# Patient Record
Sex: Female | Born: 1940
Health system: Southern US, Community
[De-identification: ages and names within clinical notes are randomized; demographics above are authoritative.]

## PROBLEM LIST (undated history)

## (undated) DIAGNOSIS — K635 Polyp of colon: Secondary | ICD-10-CM

## (undated) DIAGNOSIS — H409 Unspecified glaucoma: Secondary | ICD-10-CM

## (undated) DIAGNOSIS — H269 Unspecified cataract: Secondary | ICD-10-CM

## (undated) DIAGNOSIS — M8580 Other specified disorders of bone density and structure, unspecified site: Secondary | ICD-10-CM

## (undated) DIAGNOSIS — E785 Hyperlipidemia, unspecified: Secondary | ICD-10-CM

## (undated) DIAGNOSIS — D509 Iron deficiency anemia, unspecified: Secondary | ICD-10-CM

## (undated) DIAGNOSIS — N6019 Diffuse cystic mastopathy of unspecified breast: Secondary | ICD-10-CM

## (undated) DIAGNOSIS — I10 Essential (primary) hypertension: Secondary | ICD-10-CM

## (undated) DIAGNOSIS — J302 Other seasonal allergic rhinitis: Secondary | ICD-10-CM

## (undated) DIAGNOSIS — R079 Chest pain, unspecified: Secondary | ICD-10-CM

## (undated) DIAGNOSIS — M545 Low back pain, unspecified: Secondary | ICD-10-CM

## (undated) DIAGNOSIS — K219 Gastro-esophageal reflux disease without esophagitis: Secondary | ICD-10-CM

## (undated) DIAGNOSIS — E119 Type 2 diabetes mellitus without complications: Secondary | ICD-10-CM

## (undated) DIAGNOSIS — M1990 Unspecified osteoarthritis, unspecified site: Secondary | ICD-10-CM

## (undated) HISTORY — DX: Unspecified osteoarthritis, unspecified site: M19.90

## (undated) HISTORY — DX: Unspecified glaucoma: H40.9

## (undated) HISTORY — DX: Unspecified cataract: H26.9

## (undated) HISTORY — DX: Other seasonal allergic rhinitis: J30.2

## (undated) HISTORY — PX: BREAST LUMPECTOMY: SHX2

## (undated) HISTORY — DX: Low back pain, unspecified: M54.50

## (undated) HISTORY — DX: Other specified disorders of bone density and structure, unspecified site: M85.80

## (undated) HISTORY — DX: Type 2 diabetes mellitus without complications: E11.9

## (undated) HISTORY — DX: Iron deficiency anemia, unspecified: D50.9

## (undated) HISTORY — DX: Gastro-esophageal reflux disease without esophagitis: K21.9

## (undated) HISTORY — DX: Low back pain: M54.5

## (undated) HISTORY — DX: Diffuse cystic mastopathy of unspecified breast: N60.19

## (undated) HISTORY — PX: BREAST EXCISIONAL BIOPSY: SUR124

## (undated) HISTORY — DX: Essential (primary) hypertension: I10

## (undated) HISTORY — PX: CHOLECYSTECTOMY: SHX55

## (undated) HISTORY — DX: Chest pain, unspecified: R07.9

## (undated) HISTORY — DX: Hyperlipidemia, unspecified: E78.5

## (undated) HISTORY — PX: ABDOMINAL HYSTERECTOMY: SHX81

## (undated) HISTORY — DX: Polyp of colon: K63.5

---

## 1997-10-12 ENCOUNTER — Ambulatory Visit (HOSPITAL_COMMUNITY): Admission: RE | Admit: 1997-10-12 | Discharge: 1997-10-12 | Payer: Self-pay | Admitting: Family Medicine

## 1999-02-25 ENCOUNTER — Encounter: Payer: Self-pay | Admitting: Family Medicine

## 1999-02-25 ENCOUNTER — Ambulatory Visit (HOSPITAL_COMMUNITY): Admission: RE | Admit: 1999-02-25 | Discharge: 1999-02-25 | Payer: Self-pay | Admitting: Family Medicine

## 1999-03-21 ENCOUNTER — Other Ambulatory Visit: Admission: RE | Admit: 1999-03-21 | Discharge: 1999-03-21 | Payer: Self-pay | Admitting: Family Medicine

## 1999-04-23 ENCOUNTER — Encounter: Admission: RE | Admit: 1999-04-23 | Discharge: 1999-04-23 | Payer: Self-pay | Admitting: Family Medicine

## 1999-04-23 ENCOUNTER — Encounter: Payer: Self-pay | Admitting: Family Medicine

## 1999-05-21 ENCOUNTER — Emergency Department (HOSPITAL_COMMUNITY): Admission: EM | Admit: 1999-05-21 | Discharge: 1999-05-21 | Payer: Self-pay | Admitting: Emergency Medicine

## 1999-06-02 ENCOUNTER — Encounter: Admission: RE | Admit: 1999-06-02 | Discharge: 1999-06-24 | Payer: Self-pay | Admitting: Anesthesiology

## 2000-02-27 ENCOUNTER — Ambulatory Visit (HOSPITAL_COMMUNITY): Admission: RE | Admit: 2000-02-27 | Discharge: 2000-02-27 | Payer: Self-pay | Admitting: Family Medicine

## 2000-02-27 ENCOUNTER — Encounter: Payer: Self-pay | Admitting: Family Medicine

## 2000-03-25 ENCOUNTER — Other Ambulatory Visit: Admission: RE | Admit: 2000-03-25 | Discharge: 2000-03-25 | Payer: Self-pay | Admitting: Family Medicine

## 2001-03-08 ENCOUNTER — Encounter: Payer: Self-pay | Admitting: Family Medicine

## 2001-03-08 ENCOUNTER — Ambulatory Visit (HOSPITAL_COMMUNITY): Admission: RE | Admit: 2001-03-08 | Discharge: 2001-03-08 | Payer: Self-pay | Admitting: Family Medicine

## 2001-07-14 ENCOUNTER — Other Ambulatory Visit: Admission: RE | Admit: 2001-07-14 | Discharge: 2001-07-14 | Payer: Self-pay | Admitting: Family Medicine

## 2002-03-27 ENCOUNTER — Encounter: Payer: Self-pay | Admitting: Family Medicine

## 2002-03-27 ENCOUNTER — Ambulatory Visit (HOSPITAL_COMMUNITY): Admission: RE | Admit: 2002-03-27 | Discharge: 2002-03-27 | Payer: Self-pay | Admitting: Family Medicine

## 2002-06-02 ENCOUNTER — Encounter: Payer: Self-pay | Admitting: Cardiology

## 2002-06-02 ENCOUNTER — Observation Stay (HOSPITAL_COMMUNITY): Admission: EM | Admit: 2002-06-02 | Discharge: 2002-06-03 | Payer: Self-pay | Admitting: Emergency Medicine

## 2003-02-12 ENCOUNTER — Other Ambulatory Visit: Admission: RE | Admit: 2003-02-12 | Discharge: 2003-02-12 | Payer: Self-pay | Admitting: Family Medicine

## 2003-02-14 ENCOUNTER — Ambulatory Visit (HOSPITAL_COMMUNITY): Admission: RE | Admit: 2003-02-14 | Discharge: 2003-02-14 | Payer: Self-pay | Admitting: Family Medicine

## 2004-01-31 ENCOUNTER — Ambulatory Visit (HOSPITAL_COMMUNITY): Admission: RE | Admit: 2004-01-31 | Discharge: 2004-01-31 | Payer: Self-pay | Admitting: Family Medicine

## 2005-03-09 ENCOUNTER — Ambulatory Visit (HOSPITAL_COMMUNITY): Admission: RE | Admit: 2005-03-09 | Discharge: 2005-03-09 | Payer: Self-pay | Admitting: Family Medicine

## 2006-03-15 ENCOUNTER — Ambulatory Visit (HOSPITAL_COMMUNITY): Admission: RE | Admit: 2006-03-15 | Discharge: 2006-03-15 | Payer: Self-pay | Admitting: Family Medicine

## 2006-09-28 ENCOUNTER — Ambulatory Visit: Payer: Self-pay | Admitting: Gastroenterology

## 2006-09-28 DIAGNOSIS — K635 Polyp of colon: Secondary | ICD-10-CM

## 2006-09-28 HISTORY — DX: Polyp of colon: K63.5

## 2006-10-08 ENCOUNTER — Ambulatory Visit: Payer: Self-pay | Admitting: Gastroenterology

## 2006-10-08 ENCOUNTER — Encounter: Payer: Self-pay | Admitting: Gastroenterology

## 2007-03-21 ENCOUNTER — Ambulatory Visit (HOSPITAL_COMMUNITY): Admission: RE | Admit: 2007-03-21 | Discharge: 2007-03-21 | Payer: Self-pay | Admitting: Radiation Oncology

## 2007-11-01 LAB — HM COLONOSCOPY

## 2008-04-12 ENCOUNTER — Ambulatory Visit (HOSPITAL_COMMUNITY): Admission: RE | Admit: 2008-04-12 | Discharge: 2008-04-12 | Payer: Self-pay | Admitting: Internal Medicine

## 2008-04-19 ENCOUNTER — Ambulatory Visit: Payer: Self-pay | Admitting: Internal Medicine

## 2008-04-19 DIAGNOSIS — M899 Disorder of bone, unspecified: Secondary | ICD-10-CM | POA: Insufficient documentation

## 2008-04-19 DIAGNOSIS — E785 Hyperlipidemia, unspecified: Secondary | ICD-10-CM | POA: Insufficient documentation

## 2008-04-19 DIAGNOSIS — M479 Spondylosis, unspecified: Secondary | ICD-10-CM

## 2008-04-19 DIAGNOSIS — I10 Essential (primary) hypertension: Secondary | ICD-10-CM | POA: Insufficient documentation

## 2008-04-19 DIAGNOSIS — M949 Disorder of cartilage, unspecified: Secondary | ICD-10-CM

## 2008-04-19 DIAGNOSIS — D509 Iron deficiency anemia, unspecified: Secondary | ICD-10-CM

## 2008-04-19 DIAGNOSIS — M545 Low back pain: Secondary | ICD-10-CM

## 2008-04-19 LAB — CONVERTED CEMR LAB
Cholesterol, target level: 200 mg/dL
HDL goal, serum: 40 mg/dL
LDL Goal: 130 mg/dL

## 2008-07-23 DIAGNOSIS — H409 Unspecified glaucoma: Secondary | ICD-10-CM

## 2008-07-23 DIAGNOSIS — N6019 Diffuse cystic mastopathy of unspecified breast: Secondary | ICD-10-CM

## 2008-10-08 ENCOUNTER — Ambulatory Visit: Payer: Self-pay | Admitting: Internal Medicine

## 2008-10-08 DIAGNOSIS — R9431 Abnormal electrocardiogram [ECG] [EKG]: Secondary | ICD-10-CM

## 2008-10-08 LAB — CONVERTED CEMR LAB
ALT: 14 units/L (ref 0–35)
AST: 20 units/L (ref 0–37)
Albumin: 3.8 g/dL (ref 3.5–5.2)
Alkaline Phosphatase: 53 units/L (ref 39–117)
BUN: 12 mg/dL (ref 6–23)
Basophils Absolute: 0 10*3/uL (ref 0.0–0.1)
Basophils Relative: 0.4 % (ref 0.0–3.0)
Bilirubin Urine: NEGATIVE
Bilirubin, Direct: 0.1 mg/dL (ref 0.0–0.3)
CO2: 32 meq/L (ref 19–32)
Calcium: 9.1 mg/dL (ref 8.4–10.5)
Chloride: 105 meq/L (ref 96–112)
Cholesterol: 148 mg/dL (ref 0–200)
Creatinine, Ser: 0.8 mg/dL (ref 0.4–1.2)
Eosinophils Absolute: 0.1 10*3/uL (ref 0.0–0.7)
Eosinophils Relative: 2.1 % (ref 0.0–5.0)
Folate: 13.3 ng/mL
GFR calc non Af Amer: 92.18 mL/min (ref >60)
Glucose, Bld: 101 mg/dL — ABNORMAL HIGH (ref 70–99)
HCT: 35.5 % — ABNORMAL LOW (ref 36.0–46.0)
HDL: 64 mg/dL (ref >39.00)
Hemoglobin: 11.8 g/dL — ABNORMAL LOW (ref 12.0–15.0)
Iron: 55 ug/dL (ref 42–145)
Ketones, ur: NEGATIVE mg/dL
LDL Cholesterol: 67 mg/dL (ref 0–99)
Lymphocytes Relative: 36.6 % (ref 12.0–46.0)
Lymphs Abs: 1.4 10*3/uL (ref 0.7–4.0)
MCHC: 33.3 g/dL (ref 30.0–36.0)
MCV: 87.8 fL (ref 78.0–100.0)
Monocytes Absolute: 0.3 10*3/uL (ref 0.1–1.0)
Monocytes Relative: 8.9 % (ref 3.0–12.0)
Neutro Abs: 2.1 10*3/uL (ref 1.4–7.7)
Neutrophils Relative %: 52 % (ref 43.0–77.0)
Nitrite: NEGATIVE
Platelets: 251 10*3/uL (ref 150.0–400.0)
Potassium: 3.5 meq/L (ref 3.5–5.1)
RBC: 4.04 M/uL (ref 3.87–5.11)
RDW: 13.8 % (ref 11.5–14.6)
Saturation Ratios: 14.7 % — ABNORMAL LOW (ref 20.0–50.0)
Sodium: 143 meq/L (ref 135–145)
Specific Gravity, Urine: >=1.03 (ref 1.000–1.030)
TSH: 1.49 microintl units/mL (ref 0.35–5.50)
Total Bilirubin: 0.8 mg/dL (ref 0.3–1.2)
Total CHOL/HDL Ratio: 2
Total Protein, Urine: NEGATIVE mg/dL
Total Protein: 7.6 g/dL (ref 6.0–8.3)
Transferrin: 267.5 mg/dL (ref 212.0–360.0)
Triglycerides: 86 mg/dL (ref 0.0–149.0)
Urine Glucose: NEGATIVE mg/dL
Urobilinogen, UA: 0.2 (ref 0.0–1.0)
VLDL: 17.2 mg/dL (ref 0.0–40.0)
Vit D, 25-Hydroxy: 23 ng/mL — ABNORMAL LOW (ref 30–89)
Vitamin B-12: 434 pg/mL (ref 211–911)
WBC: 3.9 10*3/uL — ABNORMAL LOW (ref 4.5–10.5)
pH: 5 (ref 5.0–8.0)

## 2008-10-09 ENCOUNTER — Encounter: Payer: Self-pay | Admitting: Internal Medicine

## 2008-10-09 ENCOUNTER — Ambulatory Visit: Payer: Self-pay | Admitting: Internal Medicine

## 2008-10-10 ENCOUNTER — Encounter: Payer: Self-pay | Admitting: Internal Medicine

## 2009-01-04 ENCOUNTER — Telehealth: Payer: Self-pay | Admitting: Internal Medicine

## 2009-01-07 ENCOUNTER — Encounter: Payer: Self-pay | Admitting: Internal Medicine

## 2009-05-23 ENCOUNTER — Telehealth: Payer: Self-pay | Admitting: Internal Medicine

## 2009-05-27 ENCOUNTER — Ambulatory Visit (HOSPITAL_COMMUNITY): Admission: RE | Admit: 2009-05-27 | Discharge: 2009-05-27 | Payer: Self-pay | Admitting: Internal Medicine

## 2009-05-27 LAB — HM MAMMOGRAPHY: HM Mammogram: NEGATIVE

## 2009-06-03 ENCOUNTER — Ambulatory Visit: Payer: Self-pay | Admitting: Internal Medicine

## 2009-10-17 ENCOUNTER — Ambulatory Visit: Payer: Self-pay | Admitting: Internal Medicine

## 2009-10-17 LAB — CONVERTED CEMR LAB
ALT: 20 units/L (ref 0–35)
AST: 21 units/L (ref 0–37)
Albumin: 4 g/dL (ref 3.5–5.2)
Alkaline Phosphatase: 58 units/L (ref 39–117)
BUN: 16 mg/dL (ref 6–23)
Basophils Absolute: 0 10*3/uL (ref 0.0–0.1)
Basophils Relative: 0.4 % (ref 0.0–3.0)
Bilirubin, Direct: 0.1 mg/dL (ref 0.0–0.3)
CO2: 27 meq/L (ref 19–32)
Calcium: 9.1 mg/dL (ref 8.4–10.5)
Chloride: 108 meq/L (ref 96–112)
Cholesterol, target level: 200 mg/dL
Cholesterol: 175 mg/dL (ref 0–200)
Creatinine, Ser: 0.8 mg/dL (ref 0.4–1.2)
Eosinophils Absolute: 0.1 10*3/uL (ref 0.0–0.7)
Eosinophils Relative: 2.3 % (ref 0.0–5.0)
Folate: 8.6 ng/mL
GFR calc non Af Amer: 97.5 mL/min (ref >60)
Glucose, Bld: 99 mg/dL (ref 70–99)
HCT: 34.9 % — ABNORMAL LOW (ref 36.0–46.0)
HDL goal, serum: 40 mg/dL
HDL: 60.9 mg/dL (ref >39.00)
Hemoglobin: 11.7 g/dL — ABNORMAL LOW (ref 12.0–15.0)
Iron: 80 ug/dL (ref 42–145)
LDL Cholesterol: 90 mg/dL (ref 0–99)
LDL Goal: 160 mg/dL
Lymphocytes Relative: 38.8 % (ref 12.0–46.0)
Lymphs Abs: 1.7 10*3/uL (ref 0.7–4.0)
MCHC: 33.6 g/dL (ref 30.0–36.0)
MCV: 87 fL (ref 78.0–100.0)
Monocytes Absolute: 0.4 10*3/uL (ref 0.1–1.0)
Monocytes Relative: 9.2 % (ref 3.0–12.0)
Neutro Abs: 2.2 10*3/uL (ref 1.4–7.7)
Neutrophils Relative %: 49.3 % (ref 43.0–77.0)
Platelets: 284 10*3/uL (ref 150.0–400.0)
Potassium: 4.1 meq/L (ref 3.5–5.1)
RBC: 4.01 M/uL (ref 3.87–5.11)
RDW: 15.6 % — ABNORMAL HIGH (ref 11.5–14.6)
Saturation Ratios: 22 % (ref 20.0–50.0)
Sodium: 143 meq/L (ref 135–145)
TSH: 1.92 microintl units/mL (ref 0.35–5.50)
Total Bilirubin: 0.9 mg/dL (ref 0.3–1.2)
Total CHOL/HDL Ratio: 3
Total Protein: 7.7 g/dL (ref 6.0–8.3)
Transferrin: 259.9 mg/dL (ref 212.0–360.0)
Triglycerides: 120 mg/dL (ref 0.0–149.0)
VLDL: 24 mg/dL (ref 0.0–40.0)
Vitamin B-12: 370 pg/mL (ref 211–911)
WBC: 4.5 10*3/uL (ref 4.5–10.5)

## 2009-12-04 ENCOUNTER — Telehealth: Payer: Self-pay | Admitting: Internal Medicine

## 2010-01-15 ENCOUNTER — Telehealth: Payer: Self-pay | Admitting: Internal Medicine

## 2010-04-29 NOTE — Progress Notes (Signed)
Summary: REFILL   Phone Note Refill Request   Refills Requested: Medication #1:  TOPROL XL 50 MG XR24H-TAB Take 1 tablet by mouth once a day  Medication #2:  DIOVAN HCT 160-25 MG TABS Take 1 tablet by mouth once a day  Medication #3:  LIPITOR 20 MG TABS Take 1 tablet by mouth once a day  Medication #4:  IBUPROFEN 400 MG TABS three times a day as needed for lbp Patient also needs flexeril and hydrocodone. She needs these written to pick up for 90 day supply w/ refills.   Initial call taken by: Lamar Sprinkles, CMA,  December 04, 2009 10:08 AM  Follow-up for Phone Call        ok Follow-up by: Etta Grandchild MD,  December 04, 2009 11:41 AM  Additional Follow-up for Phone Call Additional follow up Details #1::        What qty is ok for hydrocodone and ibuprofen for 3 mth supply? 150 of each Additional Follow-up by: Lamar Sprinkles, CMA,  December 04, 2009 4:25 PM    Additional Follow-up for Phone Call Additional follow up Details #2::    Left vm for pt that rx's are ready for pick up  Follow-up by: Lamar Sprinkles, CMA,  December 05, 2009 4:16 PM  Prescriptions: FLEXERIL 10 MG TABS (CYCLOBENZAPRINE HCL) as needed  #100 x 3   Entered by:   Lamar Sprinkles, CMA   Authorized by:   Etta Grandchild MD   Signed by:   Lamar Sprinkles, CMA on 12/04/2009   Method used:   Print then Give to Patient   RxID:   [id number] LIPITOR 20 MG TABS (ATORVASTATIN CALCIUM) Take 1 tablet by mouth once a day  #90 x 3   Entered by:   Lamar Sprinkles, CMA   Authorized by:   Etta Grandchild MD   Signed by:   Lamar Sprinkles, CMA on 12/04/2009   Method used:   Print then Give to Patient   RxID:   [id number] DIOVAN HCT 160-25 MG TABS (VALSARTAN-HYDROCHLOROTHIAZIDE) Take 1 tablet by mouth once a day  #90 x 3   Entered by:   Lamar Sprinkles, CMA   Authorized by:   Etta Grandchild MD   Signed by:   Lamar Sprinkles, CMA on 12/04/2009   Method used:   Print then Give to Patient   RxID:    [id number] TOPROL XL 50 MG XR24H-TAB (METOPROLOL SUCCINATE) Take 1 tablet by mouth once a day  #90 x 3   Entered by:   Lamar Sprinkles, CMA   Authorized by:   Etta Grandchild MD   Signed by:   Lamar Sprinkles, CMA on 12/04/2009   Method used:   Print then Give to Patient   RxID:   [id number] IBUPROFEN 400 MG TABS (IBUPROFEN) three times a day as needed for lbp  #150 x 1   Entered by:   Lamar Sprinkles, CMA   Authorized by:   Etta Grandchild MD   Signed by:   Lamar Sprinkles, CMA on 12/04/2009   Method used:   Print then Give to Patient   RxID:   [id number] HYDROCODONE-ACETAMINOPHEN 7.5-325 MG TABS (HYDROCODONE-ACETAMINOPHEN) 1-2 by mouth q 8 hours as needed for lbp  #150 x 1   Entered by:   Lamar Sprinkles, CMA   Authorized by:   Etta Grandchild MD   Signed by:   Lamar Sprinkles, CMA on 12/04/2009   Method used:  Print then Give to Patient   RxID:   [id number]

## 2010-04-29 NOTE — Letter (Signed)
Summary: Lipid Letter  [facility]  [address] [place], [place] [address]   Phone: [phone]  Fax: [phone]    10/17/2009  Ziasia Lenoir [address] [place], [place]  [address]  Dear Ms. [place]:  We have carefully reviewed your last lipid profile from 10/17/2009 and the results are noted below with a summary of recommendations for lipid management.    Cholesterol:       175     Goal: <200   HDL "good" Cholesterol:   38.75     Goal: >40   LDL "bad" Cholesterol:   90     Goal: <160   Triglycerides:       120.0     Goal: <150        TLC Diet (Therapeutic Lifestyle Change): Saturated Fats & Transfatty acids should be kept < 7% of total calories ***Reduce Saturated Fats Polyunstaurated Fat can be up to 10% of total calories Monounsaturated Fat Fat can be up to 20% of total calories Total Fat should be no greater than 25-35% of total calories Carbohydrates should be 50-60% of total calories Protein should be approximately 15% of total calories Fiber should be at least 20-30 grams a day ***Increased fiber may help lower LDL Total Cholesterol should be < 200mg /day Consider adding plant stanol/sterols to diet (example: Benacol spread) ***A higher intake of unsaturated fat may reduce Triglycerides and Increase HDL    Adjunctive Measures (may lower LIPIDS and reduce risk of Heart Attack) include: Aerobic Exercise (20-30 minutes 3-4 times a week) Limit Alcohol Consumption Weight Reduction Aspirin 75-81 mg a day by mouth (if not allergic or contraindicated) Dietary Fiber 20-30 grams a day by mouth     Current Medications: 1)    Toprol Xl 50 Mg Xr24h-tab (Metoprolol succinate) .... Take 1 tablet by mouth once a day 2)    Diovan Hct 160-25 Mg Tabs (Valsartan-hydrochlorothiazide) .... Take 1 tablet by mouth once a day 3)    Lipitor 20 Mg Tabs (Atorvastatin calcium) .... Take 1 tablet by mouth once a day 4)    Flexeril 10 Mg Tabs (Cyclobenzaprine hcl) .... As  needed 5)    Omega 3  .... Take 1 tablet by mouth once a day 6)    Multivitamin  .... Take 1 tablet by mouth once a day 7)    Calcium  8)    Vitamin D  9)    Glucosamine Chrondrotine  10)    Ibuprofen 400 Mg Tabs (Ibuprofen) .... Three times a day as needed for lbp 11)    Hydrocodone-acetaminophen 7.5-325 Mg Tabs (Hydrocodone-acetaminophen) .Marland Kitchen.. 1-2 by mouth q 8 hours as needed for lbp 12)    Lantanopros 0.005%   If you have any questions, please call. We appreciate being able to work with you.   Sincerely,    [facility] Etta Grandchild MD

## 2010-04-29 NOTE — Progress Notes (Signed)
Summary: RX request  Phone Note Refill Request   Refills Requested: Medication #1:  DIOVAN HCT 160-25 MG TABS Take 1 tablet by mouth once a day  Medication #2:  LIPITOR 20 MG TABS Take 1 tablet by mouth once a day  Medication #3:  TOPROL XL 50 MG XR24H-TAB Take 1 tablet by mouth once a day  Follow-up for Phone Call        pt requesting 3 mos supply prescriptions she can pick up as she has to mail  to her pharmacy.  [phone] or [phone]  please call when ready. Follow-up by: Verdell Face,  May 23, 2009 3:21 PM  Additional Follow-up for Phone Call Additional follow up Details #1::        Patient notified scripts up front for pickup. Additional Follow-up by: Lucious Groves,  May 23, 2009 4:47 PM    Prescriptions: LIPITOR 20 MG TABS (ATORVASTATIN CALCIUM) Take 1 tablet by mouth once a day  #90 x 3   Entered and Authorized by:   Etta Grandchild MD   Signed by:   Etta Grandchild MD on 05/23/2009   Method used:   Print then Give to Patient   RxID:   [id number] DIOVAN HCT 160-25 MG TABS (VALSARTAN-HYDROCHLOROTHIAZIDE) Take 1 tablet by mouth once a day  #90 x 3   Entered and Authorized by:   Etta Grandchild MD   Signed by:   Etta Grandchild MD on 05/23/2009   Method used:   Print then Give to Patient   RxID:   [id number] TOPROL XL 50 MG XR24H-TAB (METOPROLOL SUCCINATE) Take 1 tablet by mouth once a day  #90 x 3   Entered and Authorized by:   Etta Grandchild MD   Signed by:   Etta Grandchild MD on 05/23/2009   Method used:   Print then Give to Patient   RxID:   [id number]

## 2010-04-29 NOTE — Progress Notes (Signed)
       New/Updated Medications: FLEXERIL 10 MG TABS (CYCLOBENZAPRINE HCL) One by mouth two times a day as needed Prescriptions: FLEXERIL 10 MG TABS (CYCLOBENZAPRINE HCL) One by mouth two times a day as needed  #180 x 3   Entered and Authorized by:   Etta Grandchild MD   Signed by:   Etta Grandchild MD on 01/15/2010   Method used:   Printed then faxed to ...       [facility]  [address] [phone]* (retail)       [address]       [place], [place]  [id number]       Ph: [phone] or [phone]       Fax: [phone]   RxID:   [id number]

## 2010-04-29 NOTE — Assessment & Plan Note (Signed)
Summary: congestion,cold x3 days not improving req to be seen today/cd   Vital Signs:  Patient profile:   70 year old female Height:      66 inches Weight:      209 pounds BMI:     33.86 O2 Sat:      99 % on Room air Temp:     98.7 degrees F oral Pulse rate:   72 / minute Pulse rhythm:   regular BP sitting:   112 / 70  (left arm) Cuff size:   large  Vitals Entered By: Rock Nephew CMA (June 03, 2009 1:14 PM)  Nutrition Counseling: Patient's BMI is greater than 25 and therefore counseled on weight management options.  O2 Flow:  Room air CC: sinus pressure, congestion, Left side ear pain, sore throat x 1wk, URI symptoms Is Patient Diabetic? No Pain Assessment Patient in pain? yes     Location: head Onset of pain  Intermittent   Primary Care Provider:  Etta Grandchild MD  CC:  sinus pressure, congestion, Left side ear pain, sore throat x 1wk, and URI symptoms.  History of Present Illness:  URI Symptoms      This is a 70 year old woman who presents with URI symptoms.  The symptoms began 1 week ago.  The severity is described as mild.  The patient reports sore throat, productive cough, and earache, but denies sick contacts.  Associated symptoms include fever of 100.5-103 degrees.  The patient denies stiff neck, dyspnea, wheezing, rash, vomiting, diarrhea, use of an antipyretic, and response to antipyretic.  The patient also reports itchy watery eyes and sneezing.  The patient denies headache, muscle aches, and severe fatigue.  The patient denies the following risk factors for Strep sinusitis: unilateral facial pain, unilateral nasal discharge, poor response to decongestant, double sickening, tooth pain, Strep exposure, tender adenopathy, and absence of cough.    Preventive Screening-Counseling & Management  Alcohol-Tobacco     Alcohol drinks/day: 0     Smoking Status: never     Passive Smoke Exposure: no  Hep-HIV-STD-Contraception     Hepatitis Risk: no risk noted  HIV Risk: no risk noted     STD Risk: no risk noted      Drug Use:  no.    Medications Prior to Update: 1)  Toprol Xl 50 Mg Xr24h-Tab (Metoprolol Succinate) .... Take 1 Tablet By Mouth Once A Day 2)  Diovan Hct 160-25 Mg Tabs (Valsartan-Hydrochlorothiazide) .... Take 1 Tablet By Mouth Once A Day 3)  Lipitor 20 Mg Tabs (Atorvastatin Calcium) .... Take 1 Tablet By Mouth Once A Day 4)  Flexeril 10 Mg Tabs (Cyclobenzaprine Hcl) .... As Needed 5)  Omega 3 .... Take 1 Tablet By Mouth Once A Day 6)  Multivitamin .... Take 1 Tablet By Mouth Once A Day 7)  Calcium 8)  Vitamin D 9)  Glucosamine Chrondrotine 10)  Ibuprofen 400 Mg Tabs (Ibuprofen) .... Three Times A Day As Needed For Lbp 11)  Hydrocodone-Acetaminophen 7.5-325 Mg Tabs (Hydrocodone-Acetaminophen) .Marland Kitchen.. 1-2 By Mouth Q 8 Hours As Needed For Lbp  Current Medications (verified): 1)  Toprol Xl 50 Mg Xr24h-Tab (Metoprolol Succinate) .... Take 1 Tablet By Mouth Once A Day 2)  Diovan Hct 160-25 Mg Tabs (Valsartan-Hydrochlorothiazide) .... Take 1 Tablet By Mouth Once A Day 3)  Lipitor 20 Mg Tabs (Atorvastatin Calcium) .... Take 1 Tablet By Mouth Once A Day 4)  Flexeril 10 Mg Tabs (Cyclobenzaprine Hcl) .... As Needed 5)  Omega  3 .Marland Kitchen.. Take 1 Tablet By Mouth Once A Day 6)  Multivitamin .... Take 1 Tablet By Mouth Once A Day 7)  Calcium 8)  Vitamin D 9)  Glucosamine Chrondrotine 10)  Ibuprofen 400 Mg Tabs (Ibuprofen) .... Three Times A Day As Needed For Lbp 11)  Hydrocodone-Acetaminophen 7.5-325 Mg Tabs (Hydrocodone-Acetaminophen) .Marland Kitchen.. 1-2 By Mouth Q 8 Hours As Needed For Lbp 12)  Zithromax Tri-Pak 500 Mg Tab (Azithromycin) .... Take As Directed One By Mouth Once Daily For 3 Days 13)  Tussionex Pennkinetic Er 8-10 Mg/56ml Lqcr (Chlorpheniramine-Hydrocodone) .... 5 Ml By Mouth Two Times A Day As Needed For Cough  Allergies (verified): 1)  ! Tramadol Hcl 2)  ! Percocet  Past History:  Past Medical History: Reviewed history from 07/23/2008  and no changes required. CHEST PAIN-UNSPECIFIED (ICD-786.50) FIBROCYSTIC BREAST DISEASE (ICD-610.1) GLAUCOMA, RIGHT EYE (ICD-365.9) OSTEOPENIA (ICD-733.90) OSTEOARTHRITIS (ICD-715.90) LOW BACK PAIN (ICD-724.2) HYPERTENSION (ICD-401.9) HYPERLIPIDEMIA (ICD-272.4) ANEMIA-IRON DEFICIENCY (ICD-280.9)    Past Surgical History: Reviewed history from 04/19/2008 and no changes required. Cholecystectomy Hysterectomy Lumpectomy-right 22 years ago  Family History: Reviewed history from 04/19/2008 and no changes required. Family History of Arthritis Family History Hypertension  Social History: Reviewed history from 04/19/2008 and no changes required. 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    Physical Exam  General:  alert, well-developed, well-nourished, well-hydrated, appropriate dress, healthy-appearing, cooperative to examination, good hygiene, and overweight-appearing.   Head:  normocephalic and atraumatic.   Ears:  R ear normal and L ear normal.   Nose:  no external deformity, no airflow obstruction, no intranasal foreign body, no nasal polyps, no nasal mucosal lesions, no mucosal friability, no active bleeding or clots, no sinus percussion tenderness, no septum abnormalities, mucosal erythema, and mucosal edema.   Mouth:  Oral mucosa and oropharynx without lesions or exudates.  Teeth in good repair. Neck:  supple, full ROM, no masses, no thyromegaly, no thyroid nodules or tenderness, normal carotid upstroke, no carotid bruits, and no cervical lymphadenopathy.   Lungs:  Normal respiratory effort, chest expands symmetrically. Lungs are clear to auscultation, no  crackles or wheezes. Heart:  Normal rate and regular rhythm. S1 and S2 normal without gallop, murmur, click, rub or other extra sounds. Abdomen:  Bowel sounds positive,abdomen soft and non-tender without masses, organomegaly or hernias noted. abdominal scar(s).   Msk:  No deformity or scoliosis noted of thoracic or lumbar spine.   Pulses:  R and L carotid,radial,femoral,dorsalis pedis and posterior tibial pulses are full and equal bilaterally Extremities:  No clubbing, cyanosis, edema, or deformity noted with normal full range of motion of all joints.   Neurologic:  No cranial nerve deficits noted. Station and gait are normal. Plantar reflexes are down-going bilaterally. DTRs are symmetrical throughout. Sensory, motor and coordinative functions appear intact. Skin:  Intact without suspicious lesions or rashes Cervical Nodes:  no anterior cervical adenopathy and no posterior cervical adenopathy.   Axillary Nodes:  no R axillary adenopathy and no L axillary adenopathy.   Psych:  Cognition and judgment appear intact. Alert and cooperative with normal attention span and concentration. No apparent delusions, illusions, hallucinations   Impression & Recommendations:  Problem # 1:  BRONCHITIS-ACUTE (ICD-466.0) Assessment  New  Her updated medication list for this problem includes:    Zithromax Tri-pak 500 Mg Tab (Azithromycin) .Marland Kitchen... Take as directed one by mouth once daily for 3 days    Tussionex Pennkinetic Er 8-10 Mg/48ml Lqcr (Chlorpheniramine-hydrocodone) .Marland KitchenMarland KitchenMarland KitchenMarland Kitchen 5 ml by mouth two times a day as needed for cough  Take antibiotics and other medications as directed. Encouraged to push clear liquids, get enough rest, and take acetaminophen as needed. To be seen in 5-7 days if no improvement, sooner if worse.  Problem # 2:  HYPERTENSION (ICD-401.9) Assessment: Unchanged  Her updated medication list for this problem includes:    Toprol Xl 50 Mg Xr24h-tab (Metoprolol succinate) .Marland Kitchen... Take 1 tablet by  mouth once a day    Diovan Hct 160-25 Mg Tabs (Valsartan-hydrochlorothiazide) .Marland Kitchen... Take 1 tablet by mouth once a day  BP today: 112/70 Prior BP: 118/70 (10/08/2008)  Prior 10 Yr Risk Heart Disease: Not enough information (04/19/2008)  Labs Reviewed: K+: 3.5 (10/08/2008) Creat: : 0.8 (10/08/2008)   Chol: 148 (10/08/2008)   HDL: 64.00 (10/08/2008)   LDL: 67 (10/08/2008)   TG: 86.0 (10/08/2008)  Complete Medication List: 1)  Toprol Xl 50 Mg Xr24h-tab (Metoprolol succinate) .... Take 1 tablet by mouth once a day 2)  Diovan Hct 160-25 Mg Tabs (Valsartan-hydrochlorothiazide) .... Take 1 tablet by mouth once a day 3)  Lipitor 20 Mg Tabs (Atorvastatin calcium) .... Take 1 tablet by mouth once a day 4)  Flexeril 10 Mg Tabs (Cyclobenzaprine hcl) .... As needed 5)  Omega 3  .... Take 1 tablet by mouth once a day 6)  Multivitamin  .... Take 1 tablet by mouth once a day 7)  Calcium  8)  Vitamin D  9)  Glucosamine Chrondrotine  10)  Ibuprofen 400 Mg Tabs (Ibuprofen) .... Three times a day as needed for lbp 11)  Hydrocodone-acetaminophen 7.5-325 Mg Tabs (Hydrocodone-acetaminophen) .Marland Kitchen.. 1-2 by mouth q 8 hours as needed for lbp 12)  Zithromax Tri-pak 500 Mg Tab (Azithromycin) .... Take as directed one by mouth once daily for 3 days 13)  Tussionex Pennkinetic Er 8-10 Mg/40ml Lqcr (Chlorpheniramine-hydrocodone) .... 5 ml by mouth two times a day as needed for cough  Patient Instructions: 1)  Please schedule a follow-up appointment in 2 weeks. 2)  Take your antibiotic as prescribed until ALL of it is gone, but stop if you develop a rash or swelling and contact our office as soon as possible. 3)  Acute bronchitis symptoms for less than 10 days are not helped by antibiotics. take over the counter cough medications. call if no improvment in  5-7 days, sooner if increasing cough, fever, or new symptoms( shortness of breath, chest pain). Prescriptions: TUSSIONEX PENNKINETIC ER 8-10 MG/5ML LQCR  (CHLORPHENIRAMINE-HYDROCODONE) 5 ml by mouth two times a day as needed for cough  #4 ounes x 0   Entered and Authorized by:   Etta Grandchild MD   Signed by:   Etta Grandchild MD on 06/03/2009   Method used:   Print then Give to Patient   RxID:   [id number] ZITHROMAX TRI-PAK 500 MG TAB (AZITHROMYCIN) Take as directed one by mouth once daily for 3 days  #3 x 0   Entered and Authorized by:   Etta Grandchild MD   Signed by:   Etta Grandchild MD on 06/03/2009   Method used:   Print then Give to Patient   RxID:   [id number]

## 2010-04-29 NOTE — Assessment & Plan Note (Signed)
Summary: CPX/[facility]/CD   Vital Signs:  Patient profile:   70 year old female Height:      66 inches Weight:      207 pounds BMI:     33.53 O2 Sat:      96 % on Room air Temp:     97.1 degrees F oral Pulse rate:   78 / minute Pulse rhythm:   regular Resp:     16 per minute BP sitting:   122 / 70  (left arm) Cuff size:   large  Vitals Entered By: Rock Nephew CMA (October 17, 2009 11:18 AM)  Nutrition Counseling: Patient's BMI is greater than 25 and therefore counseled on weight management options.  O2 Flow:  Room air  Primary Care Provider:  Etta Grandchild MD   History of Present Illness:  Follow-Up Visit      This is a 70 year old woman who presents for Follow-up visit.  The patient denies chest pain, palpitations, dizziness, syncope, edema, SOB, DOE, PND, and orthopnea.  Since the last visit the patient notes no new problems or concerns.  The patient reports taking meds as prescribed, monitoring BP, and dietary compliance.  When questioned about possible medication side effects, the patient notes none.    Lipid Management History:      Positive NCEP/ATP III risk factors include female age 110 years old or older and hypertension.  Negative NCEP/ATP III risk factors include no history of early menopause without estrogen hormone replacement, non-diabetic, HDL cholesterol greater than 60, no family history for ischemic heart disease, non-tobacco-user status, no ASHD (atherosclerotic heart disease), no prior stroke/TIA, no peripheral vascular disease, and no history of aortic aneurysm.        The patient states that she knows about the "Therapeutic Lifestyle Change" diet.  Her compliance with the TLC diet is good.  The patient expresses understanding of adjunctive measures for cholesterol lowering.  Adjunctive measures started by the patient include aerobic exercise, fiber, omega-3 supplements, limit alcohol consumpton, and weight reduction.  She expresses no side effects from  her lipid-lowering medication.  The patient denies any symptoms to suggest myopathy or liver disease.     Preventive Screening-Counseling & Management  Alcohol-Tobacco     Alcohol drinks/day: 0     Smoking Status: never     Passive Smoke Exposure: no  Hep-HIV-STD-Contraception     Hepatitis Risk: no risk noted     HIV Risk: no risk noted     STD Risk: no risk noted  Safety-Violence-Falls     Seat Belt Use: yes     Helmet Use: yes     Firearms in the Home: no firearms in the home     Smoke Detectors: yes     Violence in the Home: no risk noted     Sexual Abuse: no      Drug Use:  no.        Blood Transfusions:  no.    Medications Prior to Update: 1)  Toprol Xl 50 Mg Xr24h-Tab (Metoprolol Succinate) .... Take 1 Tablet By Mouth Once A Day 2)  Diovan Hct 160-25 Mg Tabs (Valsartan-Hydrochlorothiazide) .... Take 1 Tablet By Mouth Once A Day 3)  Lipitor 20 Mg Tabs (Atorvastatin Calcium) .... Take 1 Tablet By Mouth Once A Day 4)  Flexeril 10 Mg Tabs (Cyclobenzaprine Hcl) .... As Needed 5)  Omega 3 .... Take 1 Tablet By Mouth Once A Day 6)  Multivitamin .... Take 1 Tablet By Mouth  Once A Day 7)  Calcium 8)  Vitamin D 9)  Glucosamine Chrondrotine 10)  Ibuprofen 400 Mg Tabs (Ibuprofen) .... Three Times A Day As Needed For Lbp 11)  Hydrocodone-Acetaminophen 7.5-325 Mg Tabs (Hydrocodone-Acetaminophen) .Marland Kitchen.. 1-2 By Mouth Q 8 Hours As Needed For Lbp 12)  Tussionex Pennkinetic Er 8-10 Mg/8ml Lqcr (Chlorpheniramine-Hydrocodone) .... 5 Ml By Mouth Two Times A Day As Needed For Cough  Current Medications (verified): 1)  Toprol Xl 50 Mg Xr24h-Tab (Metoprolol Succinate) .... Take 1 Tablet By Mouth Once A Day 2)  Diovan Hct 160-25 Mg Tabs (Valsartan-Hydrochlorothiazide) .... Take 1 Tablet By Mouth Once A Day 3)  Lipitor 20 Mg Tabs (Atorvastatin Calcium) .... Take 1 Tablet By Mouth Once A Day 4)  Flexeril 10 Mg Tabs (Cyclobenzaprine Hcl) .... As Needed 5)  Omega 3 .... Take 1 Tablet By Mouth  Once A Day 6)  Multivitamin .... Take 1 Tablet By Mouth Once A Day 7)  Calcium 8)  Vitamin D 9)  Glucosamine Chrondrotine 10)  Ibuprofen 400 Mg Tabs (Ibuprofen) .... Three Times A Day As Needed For Lbp 11)  Hydrocodone-Acetaminophen 7.5-325 Mg Tabs (Hydrocodone-Acetaminophen) .Marland Kitchen.. 1-2 By Mouth Q 8 Hours As Needed For Lbp 12)  Lantanopros 0.005%  Allergies (verified): 1)  ! Tramadol Hcl 2)  ! 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 Family History of Arthritis Family History Hypertension  Social History: Reviewed history from 04/19/2008 and no changes required. Occupation: Charity fundraiser L and [employer] Married Never Smoked Alcohol use-no Drug use-no Regular exercise-yes Seat Belt Use:  yes Blood Transfusions:  no  Review of Systems       The patient complains of weight gain.  The patient denies anorexia, fever, chest pain, syncope, dyspnea on exertion, peripheral edema, prolonged cough, headaches, hemoptysis, abdominal pain, melena, hematochezia, severe indigestion/heartburn, hematuria,  suspicious skin lesions, depression, enlarged lymph nodes, angioedema, and breast masses.   Heme:  Denies abnormal bruising, bleeding, enlarge lymph nodes, fevers, pallor, and skin discoloration.  Physical Exam  General:  alert, well-developed, well-nourished, well-hydrated, appropriate dress, healthy-appearing, cooperative to examination, good hygiene, and overweight-appearing.   Head:  normocephalic and atraumatic.   Eyes:  vision grossly intact, pupils equal, and pupils round.   Mouth:  Oral mucosa and oropharynx without lesions or exudates.  Teeth in good repair. Neck:  supple, full ROM, no masses, no thyromegaly, no thyroid nodules or tenderness, normal carotid upstroke, no carotid bruits, and no cervical lymphadenopathy.   Chest Wall:  No deformities, masses, or tenderness noted. Breasts:  skin/areolae normal, no masses, no abnormal thickening, no nipple discharge, no tenderness, and no adenopathy.   Lungs:  Normal respiratory effort, chest expands symmetrically. Lungs are clear to auscultation, no crackles or wheezes. Heart:  Normal rate and regular rhythm. S1 and S2 normal without gallop, murmur, click, rub or other extra sounds.  Abdomen:  Bowel sounds positive,abdomen soft and non-tender without masses, organomegaly or hernias noted. abdominal scar(s).   Rectal:  No external abnormalities noted. Normal sphincter tone. No rectal masses or tenderness. Genitalia:  Normal introitus for age, no external lesions, no vaginal discharge, mucosa pink and moist, no vaginal or cervical lesions, no vaginal atrophy, no friaility or hemorrhage, normal uterus size and position, no adnexal masses or tenderness Msk:  No deformity or scoliosis noted of thoracic or lumbar spine.   Pulses:  R and L carotid,radial,femoral,dorsalis pedis and posterior tibial pulses are full and equal bilaterally Extremities:  No clubbing, cyanosis, edema, or deformity noted with normal full range of motion of all joints.     Neurologic:  No cranial nerve deficits noted. Station and gait are normal. Plantar reflexes are down-going bilaterally. DTRs are symmetrical throughout. Sensory, motor and coordinative functions appear intact. Skin:  turgor normal, color normal, no rashes, no suspicious lesions, no ecchymoses, no petechiae, no purpura, no ulcerations, and no edema.   Cervical Nodes:  no anterior cervical adenopathy and no posterior cervical adenopathy.   Axillary Nodes:  no R axillary adenopathy and no L axillary adenopathy.   Psych:  Cognition and judgment appear intact. Alert and cooperative with normal attention span and concentration. No apparent delusions, illusions, hallucinations   Impression & Recommendations:  Problem # 1:  ROUTINE GENERAL MEDICAL EXAM@HEALTH  CARE FACL (ICD-V70.0) Assessment New  Orders: Venipuncture (91478) TLB-Lipid Panel (80061-LIPID) TLB-BMP (Basic Metabolic Panel-BMET) (80048-METABOL) TLB-CBC Platelet - w/Differential (85025-CBCD) TLB-Hepatic/Liver Function Pnl (80076-HEPATIC) TLB-TSH (Thyroid Stimulating Hormone) (84443-TSH) TLB-B12 + Folate Pnl (29562_13086-V78/ION) TLB-IBC Pnl (Iron/FE;Transferrin) (83550-IBC) Hemoccult Guaiac-1 spec.(in office) (82270)  Mammogram: ASSESSMENT: Negative - BI-RADS 1^MM DIGITAL SCREENING (05/27/2009) Colonoscopy: Adenomatous Polyp (11/01/2007) Td Booster: Td (10/17/2009)   Chol: 148 (10/08/2008)   HDL: 64.00 (10/08/2008)   LDL: 67 (10/08/2008)   TG: 86.0 (10/08/2008) TSH: 1.49 (10/08/2008)    Discussed using sunscreen, use of alcohol, drug use, self breast exam, routine dental care, routine eye care, schedule for GYN exam, routine physical exam, seat belts, multiple vitamins, osteoporosis prevention, adequate calcium intake in diet, recommendations for immunizations, mammograms and Pap smears.  Discussed exercise and checking cholesterol.    Problem # 2:  HYPERTENSION (ICD-401.9) Assessment: Improved  Her updated medication list for  this problem includes:    Toprol Xl 50 Mg Xr24h-tab (Metoprolol succinate) .Marland Kitchen... Take 1 tablet by mouth once a day    Diovan Hct 160-25 Mg Tabs (Valsartan-hydrochlorothiazide) .Marland Kitchen... Take 1 tablet by mouth once a day  Orders: Venipuncture (62952) TLB-Lipid Panel (80061-LIPID) TLB-BMP (Basic Metabolic Panel-BMET) (80048-METABOL) TLB-CBC Platelet - w/Differential (85025-CBCD) TLB-Hepatic/Liver Function Pnl (80076-HEPATIC) TLB-TSH (Thyroid Stimulating Hormone) (84443-TSH) TLB-B12 + Folate Pnl (84132_44010-U72/ZDG) TLB-IBC Pnl (Iron/FE;Transferrin) (83550-IBC)  BP today: 122/70 Prior BP: 112/70 (06/03/2009)  Prior 10 Yr Risk Heart Disease: Not enough information (04/19/2008)  Labs Reviewed: K+: 3.5 (10/08/2008) Creat: : 0.8 (10/08/2008)   Chol: 148 (10/08/2008)   HDL: 64.00 (10/08/2008)   LDL: 67 (10/08/2008)   TG: 86.0 (10/08/2008)  Problem # 3:  HYPERLIPIDEMIA (ICD-272.4) Assessment: Unchanged  Her updated medication list for this problem includes:    Lipitor 20 Mg Tabs (Atorvastatin calcium) .Marland Kitchen... Take 1 tablet by mouth once a day  Orders: Venipuncture (64403) TLB-Lipid Panel (80061-LIPID) TLB-BMP (Basic Metabolic Panel-BMET) (80048-METABOL) TLB-CBC Platelet - w/Differential (85025-CBCD) TLB-Hepatic/Liver Function Pnl (80076-HEPATIC) TLB-TSH (Thyroid Stimulating Hormone) (84443-TSH) TLB-B12 + Folate Pnl (47425_95638-V56/EPP) TLB-IBC Pnl (Iron/FE;Transferrin) (83550-IBC)  Labs Reviewed: SGOT: 20 (10/08/2008)   SGPT: 14 (10/08/2008)  Lipid  Goals: Chol Goal: 200 (04/19/2008)   HDL Goal: 40 (04/19/2008)   LDL Goal: 130 (04/19/2008)   TG Goal: 150 (04/19/2008)  Prior 10 Yr Risk Heart Disease: Not enough information (04/19/2008)   HDL:64.00 (10/08/2008)  LDL:67 (10/08/2008)  Chol:148 (10/08/2008)  Trig:86.0 (10/08/2008)  Complete Medication List: 1)  Toprol Xl 50 Mg Xr24h-tab (Metoprolol succinate) .... Take 1 tablet by mouth once a day 2)  Diovan Hct 160-25 Mg Tabs  (Valsartan-hydrochlorothiazide) .... Take 1 tablet by mouth once a day 3)  Lipitor 20 Mg Tabs (Atorvastatin calcium) .... Take 1 tablet by mouth once a day 4)  Flexeril 10 Mg Tabs (Cyclobenzaprine hcl) .... As needed 5)  Omega 3  .... Take 1 tablet by mouth once a day 6)  Multivitamin  .... Take 1 tablet by mouth once a day 7)  Calcium  8)  Vitamin D  9)  Glucosamine Chrondrotine  10)  Ibuprofen 400 Mg Tabs (Ibuprofen) .... Three times a day as needed for lbp 11)  Hydrocodone-acetaminophen 7.5-325 Mg Tabs (Hydrocodone-acetaminophen) .Marland Kitchen.. 1-2 by mouth q 8 hours as needed for lbp 12)  Lantanopros 0.005%   Other Orders: TD Toxoids IM 7 YR + (47829) Admin 1st Vaccine (56213) Zoster (Shingles) Vaccine Live (08657) Admin of Any Addtl Vaccine (84696)  Lipid Assessment/Plan:      Based on NCEP/ATP III, the patient's risk factor category is "0-1 risk factors".  The patient's lipid goals are as follows: Total cholesterol goal is 200; LDL cholesterol goal is 160; HDL cholesterol goal is 40; Triglyceride goal is 150.    Colorectal Screening:  Current Recommendations:    Hemoccult: NEG X 1 today  Mammogram Screening:    Last Mammogram:  05/27/2009  Osteoporosis Risk Assessment:  Risk Factors for Fracture or Low Bone Density:   Smoking status:       never  Immunization & Chemoprophylaxis:    Tetanus vaccine: Td  (10/17/2009)  Patient Instructions: 1)  It is important that you exercise regularly at least 20 minutes 5 times a week. If you develop chest pain, have severe difficulty breathing, or feel very tired , stop exercising immediately and seek medical attention. 2)  You need to lose weight. Consider a lower calorie diet and regular exercise.  3)  Schedule your mammogram. 4)  Schedule a colonoscopy/sigmoidoscopy to help detect colon cancer. 5)  Check your Blood Pressure regularly. If it is above 130/80: you should make an appointment.     Immunizations Administered:  Tetanus  Vaccine:    Vaccine Type: Td    Site: left deltoid    Mfr: Evans    Dose: 0.5 ml    Route: IM    Given by: Rock Nephew CMA    Exp. Date: 05/01/2011    Lot #: [id number]    VIS given: 02/15/07 version given October 17, 2009.  Zostavax # 1:    Vaccine Type: Zostavax    Site: right deltoid    Mfr: Merck    Dose: 0.65    Route: [place]    Given by: Rock Nephew CMA    Exp. Date: 09/06/2010    Lot #: [id number]    VIS given: 01/09/05 given October 17, 2009.

## 2010-07-28 ENCOUNTER — Other Ambulatory Visit: Payer: Self-pay | Admitting: Internal Medicine

## 2010-07-28 DIAGNOSIS — Z1231 Encounter for screening mammogram for malignant neoplasm of breast: Secondary | ICD-10-CM

## 2010-08-07 ENCOUNTER — Ambulatory Visit (HOSPITAL_COMMUNITY)
Admission: RE | Admit: 2010-08-07 | Discharge: 2010-08-07 | Disposition: A | Discharge status: Home / Self Care | Payer: MEDICARE | Source: Ambulatory Visit | Attending: Internal Medicine | Admitting: Internal Medicine

## 2010-08-07 DIAGNOSIS — Z1231 Encounter for screening mammogram for malignant neoplasm of breast: Secondary | ICD-10-CM | POA: Insufficient documentation

## 2010-08-15 NOTE — Discharge Summary (Signed)
   NAME:  Alexandria [name], Alexandria [name]                           ACCOUNT NO.:  [id number]   MEDICAL RECORD NO.:  [id number]                   PATIENT TYPE:  INP   LOCATION:  2014                                 FACILITY:  [facility]   PHYSICIAN:  Charlton Haws, M.D. 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  There was some mild diaphoresis.  No exertional symptoms.  She has been having this in retrospect off and on  for the last six or seven years, but on this particular occasion her  daughter witnessed it and encouraged her to be seen.  She is admitted now  for further evaluation and therapy.   LABORATORY DATA:  Electrolytes and renal function totally normal.  Her  potassium was slightly low at 3.4 while taking HCTZ.  Hemoglobin 11.2 with a  hematocrit of 34.4.  She has been chronically anemic so she says.  Her MCV  is 82.6 and her MCHC is 32.7.  Her CKs were low with negative MBs.   HOSPITAL COURSE:  The patient was admitted and myocardial infarction ruled  out with serial CK-MBs and EKGs.  She felt fine the following morning.  The  plan is to let her go home today, keep her out of work, and plan to do an  outpatient Cardiolite.   FINAL DIAGNOSES:  1. Chest pain, myocardial infarction ruled out.  2. Controlled hypertension.  3. Mild hyperlipidemia.  Do not know the numbers at this time.  4. Status post hysterectomy.   DISPOSITION:  Outpatient Cardiolite.  This will be done on Tuesday of next  week.  No work until then.     Dian Queen, P.A. [facility]                     Charlton Haws, M.D. [facility]    BY/MEDQ  D:  06/03/2002  T:   06/04/2002  Job:  [id number]   cc:   Quita Skye. Artis Flock, M.D.  [address], [address]  [place]  [place] [address]  Fax: [phone]

## 2010-08-15 NOTE — H&P (Signed)
NAME:  Alexandria [name], Alexandria [name] NO.:  [id number]   MEDICAL RECORD NO.:  [id number]                   PATIENT TYPE:  INP   LOCATION:                                       FACILITY:  [facility]   PHYSICIAN:  Learta Codding, M.D. [facility]             DATE OF BIRTH:  1940/11/16   DATE OF ADMISSION:  06/02/2002  DATE OF DISCHARGE:                                HISTORY & PHYSICAL   REFERRING PHYSICIAN:  Quita Skye. Artis Flock, M.D.   CARDIOLOGIST:  Learta Codding, M.D. [facility]   CURRENT COMPLAINT:  Substernal chest pain.   HISTORY OF PRESENT ILLNESS:  The patient is a 70 year old African American  female who was sent to the emergency room by Dr. Artis Flock after she reported  substernal chest pain. The patient, earlier today at 2 o'clock, was sitting  at her home while she was watching a demonstration on a vacuum cleaner and  suddenly started feeling substernal chest pain. She had pain radiating from  the epigastrium into the sternum but no radiation to the shoulder. She  stated this was a squeezing and dull sensation she experienced. She reported  pain approximately 5/10. This was associated with diaphoresis but no  dyspnea. She denies any nausea. She felt increased saliva in the mouth and  felt like she had a lemon in her mouth.  She took some Pepto-Bismol  without any significant relief.   She then presented to Dr. Blair Heys office for further evaluation. In the  office, the patient was pain-free but reported the above symptoms to Dr.  Artis Flock. She states that also these symptoms have been present for  approximately 2-3 years intermittently at various times. She denies,  however, any type of exertional or substernal chest pressure and has  remained very active, able to do her house chores and her usual activities  without any limitations.   ALLERGIES:  No known drug allergies.   MEDICATIONS:  1. Diovan 160 mg daily.  2. Vitamin B.  3. The patient was not taking aspirin.   PAST  MEDICAL HISTORY:  No prior history of ever receiving a stress test. No  diabetes mellitus. Positive for hypertension. Positive for hyperlipidemia.  No tobacco.   PAST SURGICAL HISTORY:  History of hysterectomy.   SOCIAL HISTORY:  The patient is married. She lives in [place] with two  children. She denies tobacco or alcohol use. She works as a Engineer, civil (consulting) at [employer]   FAMILY HISTORY:  Her mother is alive at age 57 and has hypertension. Her  father died at age 36 of an accident. She has one sister and one brother  with hypertension.   REVIEW OF SYSTEMS:  Occasional chills and sweats. Recent respiratory  infection. No headache or sore throat. Chest pain and cough as outlined  above. No frequency or dysuria. No weakness or numbness. No myalgia or  arthritis. No  nausea or vomiting.   PHYSICAL EXAMINATION:  VITAL SIGNS:  Blood pressure 170/74, heart rate 91  bpm, temperature 98.4.  GENERAL:  This is a well-nourished black female in no apparent distress.  HEENT:  Eyes are clear.  NECK:  No JVD.  HEART:  Regular rate and rhythm, no murmur.  LUNGS:  Clear breath sounds bilaterally.  SKIN:  Warm and dry.  GENITALIA/RECTAL:  Deferred.  EXTREMITIES:  2+ peripheral pulses, no edema.   LABORATORY DATA:  A chest x-ray is pending.   EKG normal sinus rhythm. Early repolarization but, otherwise, within normal  limits.   IMPRESSION:  1. Substernal chest pain. The patient's chest pain is somewhat atypical and     has been going on for several years and there is no exertional component.     She did have an episode of chest pain today, but more like a panic     attack. Her diaphoresis, however, was concerning. We will admit the     patient and rule her out for myocardial infarction. However, if her     enzymes are negative and she is without any change on her     electrocardiogram, I do feel that it would not be unreasonable to     discharge the patient tomorrow on medical therapy and  this would include     aspirin, beta-blocker and nitroglycerin preparations with an eye to doing     a definitive test next week. I have discussed either doing a cardiac     catheterization or a Cardiolite stress study. The patient has opted for     the former and I certainly feel that this is the most definitive test for     this patient because of her longstanding history of substernal chest     pain. If the patient, however, has recurrent chest pain or any type of     arthralgia she should remain in the hospital and she should receive an     inpatient catheterization.  2. Hypertension, uncontrolled. A beta-blocker has been added to  her medical     regimen.  3. Hyperlipidemia.  4. History of rule out obesity. Check hemoglobin A1c. Rule out metabolic     syndrome.   DISPOSITION:  The patient will be admitted and ruled out for myocardial  infarction. If her enzymes are negative, will plan an outpatient cardiac  catheterization early next week.                                               Learta Codding, M.D. [facility]    GED/MEDQ  D:  06/02/2002  T:  06/02/2002  Job:  [id number]   cc:   Quita Skye. Artis Flock, M.D.  [address]  [place]  [place] [address]  Fax: [phone]

## 2010-12-09 ENCOUNTER — Encounter: Payer: Self-pay | Admitting: Internal Medicine

## 2010-12-11 ENCOUNTER — Ambulatory Visit (INDEPENDENT_AMBULATORY_CARE_PROVIDER_SITE_OTHER): Admitting: Internal Medicine

## 2010-12-11 ENCOUNTER — Other Ambulatory Visit (INDEPENDENT_AMBULATORY_CARE_PROVIDER_SITE_OTHER)

## 2010-12-11 ENCOUNTER — Encounter: Payer: Self-pay | Admitting: Internal Medicine

## 2010-12-11 DIAGNOSIS — M1990 Unspecified osteoarthritis, unspecified site: Secondary | ICD-10-CM

## 2010-12-11 DIAGNOSIS — D509 Iron deficiency anemia, unspecified: Secondary | ICD-10-CM

## 2010-12-11 DIAGNOSIS — M949 Disorder of cartilage, unspecified: Secondary | ICD-10-CM

## 2010-12-11 DIAGNOSIS — E785 Hyperlipidemia, unspecified: Secondary | ICD-10-CM

## 2010-12-11 DIAGNOSIS — M899 Disorder of bone, unspecified: Secondary | ICD-10-CM

## 2010-12-11 DIAGNOSIS — Z79899 Other long term (current) drug therapy: Secondary | ICD-10-CM

## 2010-12-11 DIAGNOSIS — I10 Essential (primary) hypertension: Secondary | ICD-10-CM

## 2010-12-11 DIAGNOSIS — M545 Low back pain: Secondary | ICD-10-CM

## 2010-12-11 LAB — LIPID PANEL
LDL Cholesterol: 64 mg/dL (ref 0–99)
Total CHOL/HDL Ratio: 2
Triglycerides: 85 mg/dL (ref 0.0–149.0)

## 2010-12-11 LAB — CBC WITH DIFFERENTIAL/PLATELET
Basophils Relative: 0.8 % (ref 0.0–3.0)
Eosinophils Relative: 3.3 % (ref 0.0–5.0)
HCT: 34.5 % — ABNORMAL LOW (ref 36.0–46.0)
Hemoglobin: 11.2 g/dL — ABNORMAL LOW (ref 12.0–15.0)
Lymphs Abs: 1.5 10*3/uL (ref 0.7–4.0)
Monocytes Relative: 8.3 % (ref 3.0–12.0)
Neutro Abs: 1.8 10*3/uL (ref 1.4–7.7)
Platelets: 258 10*3/uL (ref 150.0–400.0)
RBC: 3.97 Mil/uL (ref 3.87–5.11)
WBC: 3.8 10*3/uL — ABNORMAL LOW (ref 4.5–10.5)

## 2010-12-11 LAB — COMPREHENSIVE METABOLIC PANEL
Albumin: 3.8 g/dL (ref 3.5–5.2)
Alkaline Phosphatase: 51 U/L (ref 39–117)
Chloride: 103 mEq/L (ref 96–112)
Glucose, Bld: 123 mg/dL — ABNORMAL HIGH (ref 70–99)
Potassium: 3 mEq/L — ABNORMAL LOW (ref 3.5–5.1)
Sodium: 141 mEq/L (ref 135–145)
Total Protein: 7.5 g/dL (ref 6.0–8.3)

## 2010-12-11 LAB — FOLATE: Folate: 7.9 ng/mL (ref >5.9)

## 2010-12-11 LAB — TSH: TSH: 1.42 u[IU]/mL (ref 0.35–5.50)

## 2010-12-11 LAB — VITAMIN B12: Vitamin B-12: 287 pg/mL (ref 211–911)

## 2010-12-11 MED ORDER — VALSARTAN-HYDROCHLOROTHIAZIDE 160-25 MG PO TABS: 1.0000 | ORAL_TABLET | Freq: Every day | ORAL | Status: DC

## 2010-12-11 MED ORDER — HYDROCODONE-ACETAMINOPHEN 7.5-325 MG PO TABS: 1.0000 | ORAL_TABLET | Freq: Three times a day (TID) | ORAL | Status: DC | PRN

## 2010-12-11 MED ORDER — ATORVASTATIN CALCIUM 20 MG PO TABS: 20.0000 mg | ORAL_TABLET | Freq: Every day | ORAL | Status: DC

## 2010-12-11 MED ORDER — CYCLOBENZAPRINE HCL 10 MG PO TABS: 10.0000 mg | ORAL_TABLET | Freq: Two times a day (BID) | ORAL | Status: DC | PRN

## 2010-12-11 MED ORDER — METOPROLOL SUCCINATE ER 50 MG PO TB24: 50.0000 mg | ORAL_TABLET | Freq: Every day | ORAL | Status: DC

## 2010-12-11 NOTE — Patient Instructions (Signed)

## 2010-12-11 NOTE — Assessment & Plan Note (Signed)
She has no s/s of blood loss, I will check her CBC and vitamin levels today

## 2010-12-11 NOTE — Assessment & Plan Note (Signed)
Her BP is well controlled, I will check her lytes and renal function today 

## 2010-12-11 NOTE — Assessment & Plan Note (Signed)
Her pain is unchanged, I will continue her current meds

## 2010-12-11 NOTE — Assessment & Plan Note (Signed)
She is doing well on lipitor, I will check her labs today 

## 2010-12-11 NOTE — Progress Notes (Signed)
Subjective:    Patient ID: Alexandria [name], female    DOB: [birth year], 70 y.o.   MRN: [id number]  Hypertension This is a chronic problem. The current episode started more than 1 year ago. The problem has been gradually improving since onset. The problem is controlled. Pertinent negatives include no anxiety, blurred vision, chest pain, headaches, malaise/fatigue, neck pain, orthopnea, palpitations, peripheral edema, PND, shortness of breath or sweats. Past treatments include diuretics, angiotensin blockers and beta blockers. The current treatment provides significant improvement. Compliance problems include exercise and diet.  There is no history of chronic renal disease.  Hyperlipidemia This is a chronic problem. The current episode started more than 1 year ago. The problem is controlled. Recent lipid tests were reviewed and are variable. She has no history of chronic renal disease, diabetes, hypothyroidism, liver disease, obesity or nephrotic syndrome. Factors aggravating her hyperlipidemia include beta blockers. Pertinent negatives include no chest pain, focal sensory loss, focal weakness, leg pain, myalgias or shortness of breath. Current antihyperlipidemic treatment includes statins. The current treatment provides moderate improvement of lipids. Compliance problems include adherence to exercise and adherence to diet.       Review of Systems  Constitutional: Negative for fever, chills, malaise/fatigue, diaphoresis, activity change, appetite change, fatigue and unexpected weight change.  HENT: Negative for sore throat, facial swelling, trouble swallowing, neck pain, neck stiffness and voice change.   Eyes: Negative.  Negative for blurred vision.  Respiratory: Positive for chest tightness. Negative for apnea, cough, choking, shortness of breath, wheezing and stridor.   Cardiovascular: Negative for chest pain, palpitations, orthopnea, leg swelling and PND.  Gastrointestinal: Negative for nausea,  vomiting, abdominal pain, diarrhea, constipation, blood in stool, abdominal distention and anal bleeding.  Genitourinary: Negative for dysuria, urgency, frequency, hematuria, flank pain, decreased urine volume, enuresis, difficulty urinating and dyspareunia.  Musculoskeletal: Positive for back pain (chronic, unchanged). Negative for myalgias, joint swelling, arthralgias and gait problem.  Skin: Negative for color change, pallor, rash and wound.  Neurological: Negative for dizziness, tremors, focal weakness, seizures, syncope, facial asymmetry, speech difficulty, weakness, light-headedness, numbness and headaches.  Hematological: Negative for adenopathy. Does not bruise/bleed easily.  Psychiatric/Behavioral: Negative.        Objective:   Physical Exam  Vitals reviewed. Constitutional: She is oriented to person, place, and time. She appears well-developed and well-nourished. No distress.  HENT:  Mouth/Throat: Oropharynx is clear and moist. No oropharyngeal exudate.  Eyes: Conjunctivae are normal. Right eye exhibits no discharge. Left eye exhibits no discharge. No scleral icterus.  Neck: Normal range of motion. Neck supple. No JVD present. No tracheal deviation present. No thyromegaly present.  Cardiovascular: Normal rate, regular rhythm, normal heart sounds and intact distal pulses.  Exam reveals no gallop and no friction rub.   No murmur heard. Pulmonary/Chest: Effort normal and breath sounds normal. No stridor. No respiratory distress. She has no wheezes. She has no rales. She exhibits no tenderness.  Abdominal: Soft. Bowel sounds are normal. She exhibits no distension and no mass. There is no tenderness. There is no rebound and no guarding.  Musculoskeletal: Normal range of motion. She exhibits no edema and no tenderness.  Lymphadenopathy:    She has no cervical adenopathy.  Neurological: She is oriented to person, place, and time. She displays normal reflexes. She exhibits normal muscle  tone.  Skin: Skin is warm and dry. No rash noted. She is not diaphoretic. No erythema. No pallor.  Psychiatric: She has a normal mood and affect. Her behavior is normal.  Judgment and thought content normal.     Lab Results  Component Value Date   WBC 4.5 10/17/2009   HGB 11.7* 10/17/2009   HCT 34.9* 10/17/2009   PLT 284.0 10/17/2009   CHOL 175 10/17/2009   TRIG 120.0 10/17/2009   HDL 60.90 10/17/2009   ALT 20 10/17/2009   AST 21 10/17/2009   NA 143 10/17/2009   K 4.1 10/17/2009   CL 108 10/17/2009   CREATININE 0.8 10/17/2009   BUN 16 10/17/2009   CO2 27 10/17/2009   TSH 1.92 10/17/2009       Assessment & Plan:

## 2010-12-14 LAB — VITAMIN D 1,25 DIHYDROXY: Vitamin D 1, 25 (OH)2 Total: 31 pg/mL (ref 18–72)

## 2010-12-15 ENCOUNTER — Encounter: Payer: Self-pay | Admitting: Internal Medicine

## 2011-07-29 ENCOUNTER — Encounter: Payer: Self-pay | Admitting: Gastroenterology

## 2011-08-13 ENCOUNTER — Encounter: Payer: Self-pay | Admitting: Gastroenterology

## 2011-08-13 ENCOUNTER — Other Ambulatory Visit: Payer: Self-pay | Admitting: Internal Medicine

## 2011-08-13 DIAGNOSIS — Z1231 Encounter for screening mammogram for malignant neoplasm of breast: Secondary | ICD-10-CM

## 2011-09-04 ENCOUNTER — Encounter: Payer: Self-pay | Admitting: Gastroenterology

## 2011-09-15 ENCOUNTER — Ambulatory Visit (HOSPITAL_COMMUNITY)
Admission: RE | Admit: 2011-09-15 | Discharge: 2011-09-15 | Disposition: A | Discharge status: Home / Self Care | Payer: Medicare Other | Source: Ambulatory Visit | Attending: Internal Medicine | Admitting: Internal Medicine

## 2011-09-15 ENCOUNTER — Encounter: Payer: Medicare Other | Admitting: Gastroenterology

## 2011-09-15 DIAGNOSIS — Z1231 Encounter for screening mammogram for malignant neoplasm of breast: Secondary | ICD-10-CM

## 2011-09-17 LAB — HM MAMMOGRAPHY: HM Mammogram: NORMAL

## 2011-10-06 ENCOUNTER — Other Ambulatory Visit (INDEPENDENT_AMBULATORY_CARE_PROVIDER_SITE_OTHER): Payer: Medicare Other

## 2011-10-06 ENCOUNTER — Encounter: Payer: Self-pay | Admitting: Internal Medicine

## 2011-10-06 ENCOUNTER — Ambulatory Visit (INDEPENDENT_AMBULATORY_CARE_PROVIDER_SITE_OTHER): Payer: Medicare Other | Admitting: Internal Medicine

## 2011-10-06 VITALS — BP 108/58 | HR 64 | Temp 97.6°F | Resp 16 | Wt 199.0 lb

## 2011-10-06 DIAGNOSIS — E785 Hyperlipidemia, unspecified: Secondary | ICD-10-CM

## 2011-10-06 DIAGNOSIS — I10 Essential (primary) hypertension: Secondary | ICD-10-CM

## 2011-10-06 DIAGNOSIS — M899 Disorder of bone, unspecified: Secondary | ICD-10-CM

## 2011-10-06 DIAGNOSIS — R7309 Other abnormal glucose: Secondary | ICD-10-CM

## 2011-10-06 DIAGNOSIS — D509 Iron deficiency anemia, unspecified: Secondary | ICD-10-CM

## 2011-10-06 DIAGNOSIS — Z23 Encounter for immunization: Secondary | ICD-10-CM

## 2011-10-06 DIAGNOSIS — M949 Disorder of cartilage, unspecified: Secondary | ICD-10-CM

## 2011-10-06 DIAGNOSIS — E118 Type 2 diabetes mellitus with unspecified complications: Secondary | ICD-10-CM | POA: Insufficient documentation

## 2011-10-06 DIAGNOSIS — Z0000 Encounter for general adult medical examination without abnormal findings: Secondary | ICD-10-CM

## 2011-10-06 LAB — COMPREHENSIVE METABOLIC PANEL
ALT: 16 U/L (ref 0–35)
CO2: 29 mEq/L (ref 19–32)
Calcium: 9 mg/dL (ref 8.4–10.5)
Chloride: 105 mEq/L (ref 96–112)
GFR: 78.74 mL/min (ref >60.00)
Sodium: 141 mEq/L (ref 135–145)
Total Bilirubin: 1 mg/dL (ref 0.3–1.2)
Total Protein: 7.2 g/dL (ref 6.0–8.3)

## 2011-10-06 LAB — URINALYSIS, ROUTINE W REFLEX MICROSCOPIC
Bilirubin Urine: NEGATIVE
Nitrite: NEGATIVE
Total Protein, Urine: NEGATIVE
pH: 5.5 (ref 5.0–8.0)

## 2011-10-06 LAB — LIPID PANEL
Cholesterol: 147 mg/dL (ref 0–200)
Triglycerides: 61 mg/dL (ref 0.0–149.0)
VLDL: 12.2 mg/dL (ref 0.0–40.0)

## 2011-10-06 LAB — CBC WITH DIFFERENTIAL/PLATELET
Basophils Absolute: 0 10*3/uL (ref 0.0–0.1)
Lymphocytes Relative: 37.8 % (ref 12.0–46.0)
Monocytes Relative: 10.7 % (ref 3.0–12.0)
Platelets: 252 10*3/uL (ref 150.0–400.0)
RDW: 15.2 % — ABNORMAL HIGH (ref 11.5–14.6)

## 2011-10-06 LAB — IBC PANEL: Saturation Ratios: 15.2 % — ABNORMAL LOW (ref 20.0–50.0)

## 2011-10-06 MED ORDER — VITAMIN D3 25 MCG (1000 UNIT) PO TABS: 1000.0000 [IU] | ORAL_TABLET | Freq: Every day | ORAL | Status: AC

## 2011-10-06 MED ORDER — OMEGA-3 FATTY ACIDS 1000 MG PO CAPS: 1.0000 g | ORAL_CAPSULE | Freq: Every day | ORAL | Status: DC

## 2011-10-06 MED ORDER — GLUCOSAMINE-CHONDROITIN 500-400 MG PO TABS: 1.0000 | ORAL_TABLET | Freq: Every day | ORAL | Status: DC

## 2011-10-06 MED ORDER — VITAMIN C 1000 MG PO TABS: 1000.0000 mg | ORAL_TABLET | Freq: Every day | ORAL | Status: AC

## 2011-10-06 NOTE — Progress Notes (Signed)
Subjective:    Patient ID: Alexandria [name], female    DOB: January 05, 1941, 71 y.o.   MRN: [id number]  Hypertension This is a chronic problem. The problem has been gradually improving since onset. The problem is controlled. Pertinent negatives include no anxiety, blurred vision, chest pain, headaches, malaise/fatigue, neck pain, orthopnea, palpitations, peripheral edema, PND, shortness of breath or sweats. Agents associated with hypertension include NSAIDs. Past treatments include angiotensin blockers and diuretics. The current treatment provides significant improvement. There are no compliance problems.       Review of Systems  Constitutional: Negative for fever, chills, malaise/fatigue, diaphoresis, activity change, appetite change, fatigue and unexpected weight change.  HENT: Negative.  Negative for neck pain.   Eyes: Negative.  Negative for blurred vision.  Respiratory: Negative for cough, chest tightness, shortness of breath, wheezing and stridor.   Cardiovascular: Negative for chest pain, palpitations, orthopnea, leg swelling and PND.  Gastrointestinal: Negative for nausea, vomiting, abdominal pain, diarrhea, constipation, blood in stool and abdominal distention.  Genitourinary: Negative.   Musculoskeletal: Positive for back pain (chronic, unchanged LBP). Negative for myalgias, joint swelling, arthralgias and gait problem.  Skin: Negative for color change, pallor, rash and wound.  Neurological: Negative.  Negative for headaches.  Hematological: Negative for adenopathy. Does not bruise/bleed easily.  Psychiatric/Behavioral: Negative.        Objective:   Physical Exam  Vitals reviewed. Constitutional: She is oriented to person, place, and time. She appears well-developed and well-nourished. No distress.  HENT:  Head: Normocephalic and atraumatic.  Mouth/Throat: Oropharynx is clear and moist. No oropharyngeal exudate.  Eyes: Conjunctivae are normal. Right eye exhibits no discharge. Left  eye exhibits no discharge. No scleral icterus.  Neck: Normal range of motion. Neck supple. No JVD present. No tracheal deviation present. No thyromegaly present.  Cardiovascular: Normal rate, regular rhythm, normal heart sounds and intact distal pulses.  Exam reveals no gallop and no friction rub.   No murmur heard. Pulmonary/Chest: Effort normal and breath sounds normal. No accessory muscle usage or stridor. Not tachypneic. No respiratory distress. She has no decreased breath sounds. She has no wheezes. She has no rhonchi. She has no rales. Chest wall is not dull to percussion. She exhibits no mass, no tenderness, no bony tenderness, no crepitus, no edema, no deformity and no swelling. Right breast exhibits no inverted nipple, no mass, no nipple discharge, no skin change and no tenderness. Left breast exhibits no inverted nipple, no mass, no nipple discharge, no skin change and no tenderness. Breasts are symmetrical.  Abdominal: Soft. Bowel sounds are normal. She exhibits no distension and no mass. There is no tenderness. There is no rebound and no guarding.  Musculoskeletal: Normal range of motion. She exhibits no edema.  Lymphadenopathy:    She has no cervical adenopathy.  Neurological: She is oriented to person, place, and time.  Skin: Skin is warm and dry. No rash noted. She is not diaphoretic. No erythema. No pallor.  Psychiatric: She has a normal mood and affect. Her behavior is normal. Judgment and thought content normal.     Lab Results  Component Value Date   WBC 3.8* 12/11/2010   HGB 11.2* 12/11/2010   HCT 34.5* 12/11/2010   PLT 258.0 12/11/2010   GLUCOSE 123* 12/11/2010   CHOL 143 12/11/2010   TRIG 85.0 12/11/2010   HDL 62.30 12/11/2010   LDLCALC 64 12/11/2010   ALT 24 12/11/2010   AST 21 12/11/2010   NA 141 12/11/2010   K 3.0* 12/11/2010  CL 103 12/11/2010   CREATININE 0.9 12/11/2010   BUN 19 12/11/2010   CO2 31 12/11/2010   TSH 1.42 12/11/2010       Assessment & Plan:

## 2011-10-06 NOTE — Patient Instructions (Signed)
Preventive Care for Adults, Female A healthy lifestyle and preventive care can promote health and wellness. Preventive health guidelines for women include the following key practices.  A routine yearly physical is a good way to check with your caregiver about your health and preventive screening. It is a chance to share any concerns and updates on your health, and to receive a thorough exam.   Visit your dentist for a routine exam and preventive care every 6 months. Brush your teeth twice a day and floss once a day. Good oral hygiene prevents tooth decay and gum disease.   The frequency of eye exams is based on your age, health, family medical history, use of contact lenses, and other factors. Follow your caregiver's recommendations for frequency of eye exams.   Eat a healthy diet. Foods like vegetables, fruits, whole grains, low-fat dairy products, and lean protein foods contain the nutrients you need without too many calories. Decrease your intake of foods high in solid fats, added sugars, and salt. Eat the right amount of calories for you.Get information about a proper diet from your caregiver, if necessary.   Regular physical exercise is one of the most important things you can do for your health. Most adults should get at least 150 minutes of moderate-intensity exercise (any activity that increases your heart rate and causes you to sweat) each week. In addition, most adults need muscle-strengthening exercises on 2 or more days a week.   Maintain a healthy weight. The body mass index (BMI) is a screening tool to identify possible weight problems. It provides an estimate of body fat based on height and weight. Your caregiver can help determine your BMI, and can help you achieve or maintain a healthy weight.For adults 20 years and older:   A BMI below 18.5 is considered underweight.   A BMI of 18.5 to 24.9 is normal.   A BMI of 25 to 29.9 is considered overweight.   A BMI of 30 and above is  considered obese.   Maintain normal blood lipids and cholesterol levels by exercising and minimizing your intake of saturated fat. Eat a balanced diet with plenty of fruit and vegetables. Blood tests for lipids and cholesterol should begin at age 20 and be repeated every 5 years. If your lipid or cholesterol levels are high, you are over 50, or you are at high risk for heart disease, you may need your cholesterol levels checked more frequently.Ongoing high lipid and cholesterol levels should be treated with medicines if diet and exercise are not effective.   If you smoke, find out from your caregiver how to quit. If you do not use tobacco, do not start.   If you are pregnant, do not drink alcohol. If you are breastfeeding, be very cautious about drinking alcohol. If you are not pregnant and choose to drink alcohol, do not exceed 1 drink per day. One drink is considered to be 12 ounces (355 mL) of beer, 5 ounces (148 mL) of wine, or 1.5 ounces (44 mL) of liquor.   Avoid use of street drugs. Do not share needles with anyone. Ask for help if you need support or instructions about stopping the use of drugs.   High blood pressure causes heart disease and increases the risk of stroke. Your blood pressure should be checked at least every 1 to 2 years. Ongoing high blood pressure should be treated with medicines if weight loss and exercise are not effective.   If you are 55 to 69   years old, ask your caregiver if you should take aspirin to prevent strokes.   Diabetes screening involves taking a blood sample to check your fasting blood sugar level. This should be done once every 3 years, after age 45, if you are within normal weight and without risk factors for diabetes. Testing should be considered at a younger age or be carried out more frequently if you are overweight and have at least 1 risk factor for diabetes.   Breast cancer screening is essential preventive care for women. You should practice "breast  self-awareness." This means understanding the normal appearance and feel of your breasts and may include breast self-examination. Any changes detected, no matter how small, should be reported to a caregiver. Women in their 20s and 30s should have a clinical breast exam (CBE) by a caregiver as part of a regular health exam every 1 to 3 years. After age 40, women should have a CBE every year. Starting at age 40, women should consider having a mammography (breast X-ray test) every year. Women who have a family history of breast cancer should talk to their caregiver about genetic screening. Women at a high risk of breast cancer should talk to their caregivers about having magnetic resonance imaging (MRI) and a mammography every year.   The Pap test is a screening test for cervical cancer. A Pap test can show cell changes on the cervix that might become cervical cancer if left untreated. A Pap test is a procedure in which cells are obtained and examined from the lower end of the uterus (cervix).   Women should have a Pap test starting at age 21.   Between ages 21 and 29, Pap tests should be repeated every 2 years.   Beginning at age 30, you should have a Pap test every 3 years as long as the past 3 Pap tests have been normal.   Some women have medical problems that increase the chance of getting cervical cancer. Talk to your caregiver about these problems. It is especially important to talk to your caregiver if a new problem develops soon after your last Pap test. In these cases, your caregiver may recommend more frequent screening and Pap tests.   The above recommendations are the same for women who have or have not gotten the vaccine for human papillomavirus (HPV).   If you had a hysterectomy for a problem that was not cancer or a condition that could lead to cancer, then you no longer need Pap tests. Even if you no longer need a Pap test, a regular exam is a good idea to make sure no other problems are  starting.   If you are between ages 65 and 70, and you have had normal Pap tests going back 10 years, you no longer need Pap tests. Even if you no longer need a Pap test, a regular exam is a good idea to make sure no other problems are starting.   If you have had past treatment for cervical cancer or a condition that could lead to cancer, you need Pap tests and screening for cancer for at least 20 years after your treatment.   If Pap tests have been discontinued, risk factors (such as a new sexual partner) need to be reassessed to determine if screening should be resumed.   The HPV test is an additional test that may be used for cervical cancer screening. The HPV test looks for the virus that can cause the cell changes on the cervix.   The cells collected during the Pap test can be tested for HPV. The HPV test could be used to screen women aged 30 years and older, and should be used in women of any age who have unclear Pap test results. After the age of 30, women should have HPV testing at the same frequency as a Pap test.   Colorectal cancer can be detected and often prevented. Most routine colorectal cancer screening begins at the age of 50 and continues through age 75. However, your caregiver may recommend screening at an earlier age if you have risk factors for colon cancer. On a yearly basis, your caregiver may provide home test kits to check for hidden blood in the stool. Use of a small camera at the end of a tube, to directly examine the colon (sigmoidoscopy or colonoscopy), can detect the earliest forms of colorectal cancer. Talk to your caregiver about this at age 50, when routine screening begins. Direct examination of the colon should be repeated every 5 to 10 years through age 75, unless early forms of pre-cancerous polyps or small growths are found.   Hepatitis C blood testing is recommended for all people born from 1945 through 1965 and any individual with known risks for hepatitis C.    Practice safe sex. Use condoms and avoid high-risk sexual practices to reduce the spread of sexually transmitted infections (STIs). STIs include gonorrhea, chlamydia, syphilis, trichomonas, herpes, HPV, and human immunodeficiency virus (HIV). Herpes, HIV, and HPV are viral illnesses that have no cure. They can result in disability, cancer, and death. Sexually active women aged 25 and younger should be checked for chlamydia. Older women with new or multiple partners should also be tested for chlamydia. Testing for other STIs is recommended if you are sexually active and at increased risk.   Osteoporosis is a disease in which the bones lose minerals and strength with aging. This can result in serious bone fractures. The risk of osteoporosis can be identified using a bone density scan. Women ages 65 and over and women at risk for fractures or osteoporosis should discuss screening with their caregivers. Ask your caregiver whether you should take a calcium supplement or vitamin D to reduce the rate of osteoporosis.   Menopause can be associated with physical symptoms and risks. Hormone replacement therapy is available to decrease symptoms and risks. You should talk to your caregiver about whether hormone replacement therapy is right for you.   Use sunscreen with sun protection factor (SPF) of 30 or more. Apply sunscreen liberally and repeatedly throughout the day. You should seek shade when your shadow is shorter than you. Protect yourself by wearing long sleeves, pants, a wide-brimmed hat, and sunglasses year round, whenever you are outdoors.   Once a month, do a whole body skin exam, using a mirror to look at the skin on your back. Notify your caregiver of new moles, moles that have irregular borders, moles that are larger than a pencil eraser, or moles that have changed in shape or color.   Stay current with required immunizations.   Influenza. You need a dose every fall (or winter). The composition of  the flu vaccine changes each year, so being vaccinated once is not enough.   Pneumococcal polysaccharide. You need 1 to 2 doses if you smoke cigarettes or if you have certain chronic medical conditions. You need 1 dose at age 65 (or older) if you have never been vaccinated.   Tetanus, diphtheria, pertussis (Tdap, Td). Get 1 dose of   Tdap vaccine if you are younger than age 65, are over 65 and have contact with an infant, are a healthcare worker, are pregnant, or simply want to be protected from whooping cough. After that, you need a Td booster dose every 10 years. Consult your caregiver if you have not had at least 3 tetanus and diphtheria-containing shots sometime in your life or have a deep or dirty wound.   HPV. You need this vaccine if you are a woman age 26 or younger. The vaccine is given in 3 doses over 6 months.   Measles, mumps, rubella (MMR). You need at least 1 dose of MMR if you were born in 1957 or later. You may also need a second dose.   Meningococcal. If you are age 19 to 21 and a first-year college student living in a residence hall, or have one of several medical conditions, you need to get vaccinated against meningococcal disease. You may also need additional booster doses.   Zoster (shingles). If you are age 60 or older, you should get this vaccine.   Varicella (chickenpox). If you have never had chickenpox or you were vaccinated but received only 1 dose, talk to your caregiver to find out if you need this vaccine.   Hepatitis A. You need this vaccine if you have a specific risk factor for hepatitis A virus infection or you simply wish to be protected from this disease. The vaccine is usually given as 2 doses, 6 to 18 months apart.   Hepatitis B. You need this vaccine if you have a specific risk factor for hepatitis B virus infection or you simply wish to be protected from this disease. The vaccine is given in 3 doses, usually over 6 months.  Preventive Services /  Frequency Ages 19 to 39  Blood pressure check.** / Every 1 to 2 years.   Lipid and cholesterol check.** / Every 5 years beginning at age 20.   Clinical breast exam.** / Every 3 years for women in their 20s and 30s.   Pap test.** / Every 2 years from ages 21 through 29. Every 3 years starting at age 30 through age 65 or 70 with a history of 3 consecutive normal Pap tests.   HPV screening.** / Every 3 years from ages 30 through ages 65 to 70 with a history of 3 consecutive normal Pap tests.   Hepatitis C blood test.** / For any individual with known risks for hepatitis C.   Skin self-exam. / Monthly.   Influenza immunization.** / Every year.   Pneumococcal polysaccharide immunization.** / 1 to 2 doses if you smoke cigarettes or if you have certain chronic medical conditions.   Tetanus, diphtheria, pertussis (Tdap, Td) immunization. / A one-time dose of Tdap vaccine. After that, you need a Td booster dose every 10 years.   HPV immunization. / 3 doses over 6 months, if you are 26 and younger.   Measles, mumps, rubella (MMR) immunization. / You need at least 1 dose of MMR if you were born in 1957 or later. You may also need a second dose.   Meningococcal immunization. / 1 dose if you are age 19 to 21 and a first-year college student living in a residence hall, or have one of several medical conditions, you need to get vaccinated against meningococcal disease. You may also need additional booster doses.   Varicella immunization.** / Consult your caregiver.   Hepatitis A immunization.** / Consult your caregiver. 2 doses, 6 to 18 months   apart.   Hepatitis B immunization.** / Consult your caregiver. 3 doses usually over 6 months.  Ages 40 to 64  Blood pressure check.** / Every 1 to 2 years.   Lipid and cholesterol check.** / Every 5 years beginning at age 20.   Clinical breast exam.** / Every year after age 40.   Mammogram.** / Every year beginning at age 40 and continuing for as  long as you are in good health. Consult with your caregiver.   Pap test.** / Every 3 years starting at age 30 through age 65 or 70 with a history of 3 consecutive normal Pap tests.   HPV screening.** / Every 3 years from ages 30 through ages 65 to 70 with a history of 3 consecutive normal Pap tests.   Fecal occult blood test (FOBT) of stool. / Every year beginning at age 50 and continuing until age 75. You may not need to do this test if you get a colonoscopy every 10 years.   Flexible sigmoidoscopy or colonoscopy.** / Every 5 years for a flexible sigmoidoscopy or every 10 years for a colonoscopy beginning at age 50 and continuing until age 75.   Hepatitis C blood test.** / For all people born from 1945 through 1965 and any individual with known risks for hepatitis C.   Skin self-exam. / Monthly.   Influenza immunization.** / Every year.   Pneumococcal polysaccharide immunization.** / 1 to 2 doses if you smoke cigarettes or if you have certain chronic medical conditions.   Tetanus, diphtheria, pertussis (Tdap, Td) immunization.** / A one-time dose of Tdap vaccine. After that, you need a Td booster dose every 10 years.   Measles, mumps, rubella (MMR) immunization. / You need at least 1 dose of MMR if you were born in 1957 or later. You may also need a second dose.   Varicella immunization.** / Consult your caregiver.   Meningococcal immunization.** / Consult your caregiver.   Hepatitis A immunization.** / Consult your caregiver. 2 doses, 6 to 18 months apart.   Hepatitis B immunization.** / Consult your caregiver. 3 doses, usually over 6 months.  Ages 65 and over  Blood pressure check.** / Every 1 to 2 years.   Lipid and cholesterol check.** / Every 5 years beginning at age 20.   Clinical breast exam.** / Every year after age 40.   Mammogram.** / Every year beginning at age 40 and continuing for as long as you are in good health. Consult with your caregiver.   Pap test.** /  Every 3 years starting at age 30 through age 65 or 70 with a 3 consecutive normal Pap tests. Testing can be stopped between 65 and 70 with 3 consecutive normal Pap tests and no abnormal Pap or HPV tests in the past 10 years.   HPV screening.** / Every 3 years from ages 30 through ages 65 or 70 with a history of 3 consecutive normal Pap tests. Testing can be stopped between 65 and 70 with 3 consecutive normal Pap tests and no abnormal Pap or HPV tests in the past 10 years.   Fecal occult blood test (FOBT) of stool. / Every year beginning at age 50 and continuing until age 75. You may not need to do this test if you get a colonoscopy every 10 years.   Flexible sigmoidoscopy or colonoscopy.** / Every 5 years for a flexible sigmoidoscopy or every 10 years for a colonoscopy beginning at age 50 and continuing until age 75.   Hepatitis   C blood test.** / For all people born from 1945 through 1965 and any individual with known risks for hepatitis C.   Osteoporosis screening.** / A one-time screening for women ages 65 and over and women at risk for fractures or osteoporosis.   Skin self-exam. / Monthly.   Influenza immunization.** / Every year.   Pneumococcal polysaccharide immunization.** / 1 dose at age 65 (or older) if you have never been vaccinated.   Tetanus, diphtheria, pertussis (Tdap, Td) immunization. / A one-time dose of Tdap vaccine if you are over 65 and have contact with an infant, are a healthcare worker, or simply want to be protected from whooping cough. After that, you need a Td booster dose every 10 years.   Varicella immunization.** / Consult your caregiver.   Meningococcal immunization.** / Consult your caregiver.   Hepatitis A immunization.** / Consult your caregiver. 2 doses, 6 to 18 months apart.   Hepatitis B immunization.** / Check with your caregiver. 3 doses, usually over 6 months.  ** Family history and personal history of risk and conditions may change your caregiver's  recommendations. Document Released: 05/12/2001 Document Revised: 03/05/2011 Document Reviewed: 08/11/2010 [employer] Patient Information 2012 [employer]. 

## 2011-10-07 NOTE — Assessment & Plan Note (Addendum)

## 2011-10-07 NOTE — Assessment & Plan Note (Signed)
Her BP is well controlled, I will check her lytes and renal function today 

## 2011-10-07 NOTE — Assessment & Plan Note (Signed)
I will check her a1c to see if she has developed DM II 

## 2011-10-07 NOTE — Assessment & Plan Note (Signed)
She is doing well on lipitor 

## 2011-10-07 NOTE — Assessment & Plan Note (Signed)
She is due for a BMD test 

## 2011-10-07 NOTE — Assessment & Plan Note (Signed)
I will check her CBC and iron studies today 

## 2011-10-15 ENCOUNTER — Ambulatory Visit (AMBULATORY_SURGERY_CENTER): Payer: Medicare Other | Admitting: *Deleted

## 2011-10-15 VITALS — Ht 66.0 in | Wt 197.8 lb

## 2011-10-15 DIAGNOSIS — Z1211 Encounter for screening for malignant neoplasm of colon: Secondary | ICD-10-CM

## 2011-10-15 MED ORDER — MOVIPREP 100 G PO SOLR: 1.0000 | Freq: Once | ORAL | Status: DC

## 2011-10-27 ENCOUNTER — Telehealth: Payer: Self-pay

## 2011-10-27 DIAGNOSIS — E785 Hyperlipidemia, unspecified: Secondary | ICD-10-CM

## 2011-10-27 DIAGNOSIS — I10 Essential (primary) hypertension: Secondary | ICD-10-CM

## 2011-10-27 DIAGNOSIS — M545 Low back pain: Secondary | ICD-10-CM

## 2011-10-27 MED ORDER — VALSARTAN-HYDROCHLOROTHIAZIDE 160-25 MG PO TABS: 1.0000 | ORAL_TABLET | Freq: Every day | ORAL | Status: DC

## 2011-10-27 MED ORDER — METOPROLOL SUCCINATE ER 50 MG PO TB24: 50.0000 mg | ORAL_TABLET | Freq: Every day | ORAL | Status: DC

## 2011-10-27 MED ORDER — ATORVASTATIN CALCIUM 20 MG PO TABS: 20.0000 mg | ORAL_TABLET | Freq: Every day | ORAL | Status: DC

## 2011-10-27 MED ORDER — CYCLOBENZAPRINE HCL 10 MG PO TABS: 10.0000 mg | ORAL_TABLET | Freq: Two times a day (BID) | ORAL | Status: DC | PRN

## 2011-10-27 NOTE — Telephone Encounter (Signed)
Patient called requesting chron[facility] med to be printed and mailed to her so that she may have processed through the [place] printed and mailed

## 2011-10-28 ENCOUNTER — Encounter: Payer: Self-pay | Admitting: Gastroenterology

## 2011-10-28 ENCOUNTER — Ambulatory Visit (AMBULATORY_SURGERY_CENTER): Payer: Medicare Other | Admitting: Gastroenterology

## 2011-10-28 VITALS — BP 138/66 | HR 73 | Temp 97.9°F | Resp 18 | Ht 66.0 in | Wt 197.0 lb

## 2011-10-28 DIAGNOSIS — K635 Polyp of colon: Secondary | ICD-10-CM

## 2011-10-28 DIAGNOSIS — Z1211 Encounter for screening for malignant neoplasm of colon: Secondary | ICD-10-CM

## 2011-10-28 DIAGNOSIS — D126 Benign neoplasm of colon, unspecified: Secondary | ICD-10-CM

## 2011-10-28 DIAGNOSIS — Z86010 Personal history of colonic polyps: Secondary | ICD-10-CM

## 2011-10-28 MED ORDER — SODIUM CHLORIDE 0.9 % IV SOLN: 500.0000 mL | INTRAVENOUS | Status: DC

## 2011-10-28 NOTE — Progress Notes (Signed)
Patient did not experience any of the following events: a burn prior to discharge; a fall within the facility; wrong site/side/patient/procedure/implant event; or a hospital transfer or hospital admission upon discharge from the facility. (G8907) Patient did not have preoperative order for IV antibiotic SSI prophylaxis. ([id number])  

## 2011-10-28 NOTE — Patient Instructions (Signed)
1 polyp removed and sent to pathology, internal hemorrhoids and moderate diverticulosis. Await pathology results  YOU HAD AN ENDOSCOPIC PROCEDURE TODAY AT THE [facility]: Refer to the procedure report that was given to you for any specific questions about what was found during the examination.  If the procedure report does not answer your questions, please call your gastroenterologist to clarify.  If you requested that your care partner not be given the details of your procedure findings, then the procedure report has been included in a sealed envelope for you to review at your convenience later.  YOU SHOULD EXPECT: Some feelings of bloating in the abdomen. Passage of more gas than usual.  Walking can help get rid of the air that was put into your GI tract during the procedure and reduce the bloating. If you had a lower endoscopy (such as a colonoscopy or flexible sigmoidoscopy) you may notice spotting of blood in your stool or on the toilet paper. If you underwent a bowel prep for your procedure, then you may not have a normal bowel movement for a few days.  DIET: Your first meal following the procedure should be a light meal and then it is ok to progress to your normal diet.  A half-sandwich or bowl of soup is an example of a good first meal.  Heavy or fried foods are harder to digest and may make you feel nauseous or bloated.  Likewise meals heavy in dairy and vegetables can cause extra gas to form and this can also increase the bloating.  Drink plenty of fluids but you should avoid alcoholic beverages for 24 hours.  ACTIVITY: Your care partner should take you home directly after the procedure.  You should plan to take it easy, moving slowly for the rest of the day.  You can resume normal activity the day after the procedure however you should NOT DRIVE or use heavy machinery for 24 hours (because of the sedation medicines used during the test).    SYMPTOMS TO REPORT IMMEDIATELY: A  gastroenterologist can be reached at any hour.  During normal business hours, 8:30 AM to 5:00 PM Monday through Friday, call [phone].  After hours and on weekends, please call the GI answering service at [phone] who will take a message and have the physician on call contact you.   Following lower endoscopy (colonoscopy or flexible sigmoidoscopy):  Excessive amounts of blood in the stool  Significant tenderness or worsening of abdominal pains  Swelling of the abdomen that is new, acute  Fever of 100F or higher  FOLLOW UP: If any biopsies were taken you will be contacted by phone or by letter within the next 1-3 weeks.  Call your gastroenterologist if you have not heard about the biopsies in 3 weeks.  Our staff will call the home number listed on your records the next business day following your procedure to check on you and address any questions or concerns that you may have at that time regarding the information given to you following your procedure. This is a courtesy call and so if there is no answer at the home number and we have not heard from you through the emergency physician on call, we will assume that you have returned to your regular daily activities without incident.  SIGNATURES/CONFIDENTIALITY: You and/or your care partner have signed paperwork which will be entered into your electronic medical record.  These signatures attest to the fact that that the information above on your After  Visit Summary has been reviewed and is understood.  Full responsibility of the confidentiality of this discharge information lies with you and/or your care-partner.

## 2011-10-28 NOTE — Op Note (Signed)
[facility] [address] [place], [place]  [address]  COLONOSCOPY PROCEDURE REPORT PATIENT:  [name], Alexandria  MR#:  [id number] BIRTHDATE:  [birth year], 71 yrs. old  GENDER:  female ENDOSCOPIST:  Judie Petit T. Russella Dar, MD, [facility]  PROCEDURE DATE:  10/28/2011 PROCEDURE:  Colonoscopy with snare polypectomy ASA CLASS:  Class II INDICATIONS:  1) surveillance and high-risk screening  2) history of pre-cancerous (adenomatous) colon polyps: 09/2006 MEDICATIONS:   MAC sedation, administered by CRNA, propofol (Diprivan) 280 mg IV DESCRIPTION OF PROCEDURE:   After the risks benefits and alternatives of the procedure were thoroughly explained, informed consent was obtained.  Digital rectal exam was performed and revealed no abnormalities.   The LB CF-[id number] endoscope was introduced through the anus and advanced to the cecum, which was identified by both the appendix and ileocecal valve, without limitations.  The quality of the prep was good, using MoviPrep. The instrument was then slowly withdrawn as the colon was fully examined. <<PROCEDUREIMAGES>> FINDINGS:  Scattered diverticula were found in the ascending colon. Moderate diverticulosis was found in the sigmoid to descending colon. A sessile polyp was found in the descending colon. It was 5 mm in size. Polyp was snared without cautery. Retrieval was successful.  Otherwise normal colonoscopy without other polyps, masses, vascular ectasias, or inflammatory changes. Retroflexed views in the rectum revealed internal hemorrhoids, small. The time to cecum = 4.25  minutes. The scope was then withdrawn (time =  8.75 min) from the patient and the procedure completed.  COMPLICATIONS:  None  ENDOSCOPIC IMPRESSION: 1) Diverticula, scattered in the ascending colon 2) Moderate diverticulosis in the sigmoid to descending colon 3) 5 mm sessile polyp in the descending colon 4) Internal hemorrhoids  RECOMMENDATIONS: 1) Await pathology  results 2) High fiber diet with liberal fluid intake. 3) Repeat Colonoscopy in 5 years.  Venita Lick. Russella Dar, MD, [facility]  n. eSIGNED:   Venita Lick. Tannisha Kennington at 10/28/2011 11:34 AM  Michaelle Copas, [phone]

## 2011-10-29 ENCOUNTER — Telehealth: Payer: Self-pay | Admitting: *Deleted

## 2011-10-29 NOTE — Telephone Encounter (Signed)
Attempted to call pt  Voicemail not set up yet

## 2011-11-02 ENCOUNTER — Encounter: Payer: Self-pay | Admitting: Gastroenterology

## 2011-11-24 ENCOUNTER — Ambulatory Visit (INDEPENDENT_AMBULATORY_CARE_PROVIDER_SITE_OTHER): Payer: Medicare Other | Admitting: Internal Medicine

## 2011-11-24 ENCOUNTER — Encounter: Payer: Self-pay | Admitting: Internal Medicine

## 2011-11-24 VITALS — BP 134/84 | HR 80 | Temp 97.2°F | Resp 16 | Wt 197.0 lb

## 2011-11-24 DIAGNOSIS — M51369 Other intervertebral disc degeneration, lumbar region without mention of lumbar back pain or lower extremity pain: Secondary | ICD-10-CM | POA: Insufficient documentation

## 2011-11-24 DIAGNOSIS — D509 Iron deficiency anemia, unspecified: Secondary | ICD-10-CM

## 2011-11-24 DIAGNOSIS — M5136 Other intervertebral disc degeneration, lumbar region: Secondary | ICD-10-CM

## 2011-11-24 DIAGNOSIS — M5137 Other intervertebral disc degeneration, lumbosacral region: Secondary | ICD-10-CM

## 2011-11-24 DIAGNOSIS — M545 Low back pain: Secondary | ICD-10-CM

## 2011-11-24 DIAGNOSIS — M1990 Unspecified osteoarthritis, unspecified site: Secondary | ICD-10-CM

## 2011-11-24 DIAGNOSIS — I10 Essential (primary) hypertension: Secondary | ICD-10-CM

## 2011-11-24 MED ORDER — HYDROCODONE-ACETAMINOPHEN 7.5-325 MG PO TABS: 1.0000 | ORAL_TABLET | Freq: Three times a day (TID) | ORAL | Status: DC | PRN

## 2011-11-24 NOTE — Assessment & Plan Note (Signed)
Continue current meds 

## 2011-11-24 NOTE — Progress Notes (Signed)
Subjective:    Patient ID: Alexandria [name], female    DOB: [birth year], 71 y.o.   MRN: [id number]  Anemia Presents for follow-up visit. There has been no abdominal pain, anorexia, bruising/bleeding easily, confusion, fever, leg swelling, light-headedness, malaise/fatigue, pallor, palpitations, paresthesias, pica or weight loss. Signs of blood loss that are not present include hematemesis, hematochezia, melena and vaginal bleeding. There are no compliance problems.   Back Pain This is a chronic problem. The current episode started more than 1 year ago. The problem occurs intermittently. The problem is unchanged. The pain is present in the lumbar spine. The quality of the pain is described as aching. The pain does not radiate. The pain is at a severity of 4/10. The pain is moderate. The pain is worse during the day. The symptoms are aggravated by bending, standing and sitting. Stiffness is present all day. Pertinent negatives include no abdominal pain, bladder incontinence, bowel incontinence, chest pain, dysuria, fever, headaches, leg pain, numbness, paresis, paresthesias, pelvic pain, perianal numbness, tingling, weakness or weight loss. Risk factors include obesity, menopause and lack of exercise. She has tried NSAIDs, muscle relaxant and analgesics for the symptoms. The treatment provided significant relief.      Review of Systems  Constitutional: Negative.  Negative for fever, chills, weight loss, malaise/fatigue, diaphoresis, activity change, appetite change, fatigue and unexpected weight change.  HENT: Negative.   Eyes: Negative.   Respiratory: Negative.   Cardiovascular: Negative.  Negative for chest pain and palpitations.  Gastrointestinal: Negative.  Negative for abdominal pain, melena, hematochezia, anorexia, hematemesis and bowel incontinence.  Genitourinary: Negative.  Negative for bladder incontinence, dysuria, vaginal bleeding and pelvic pain.  Musculoskeletal: Positive for back pain  and arthralgias (knees). Negative for myalgias, joint swelling and gait problem.  Skin: Negative for color change, pallor, rash and wound.  Neurological: Negative.  Negative for tingling, weakness, light-headedness, numbness, headaches and paresthesias.  Hematological: Negative for adenopathy. Does not bruise/bleed easily.  Psychiatric/Behavioral: Negative.  Negative for confusion.       Objective:   Physical Exam  Vitals reviewed. Constitutional: She is oriented to person, place, and time. She appears well-developed and well-nourished. No distress.  HENT:  Head: Normocephalic and atraumatic.  Mouth/Throat: Oropharynx is clear and moist. No oropharyngeal exudate.  Eyes: Conjunctivae are normal. Right eye exhibits no discharge. Left eye exhibits no discharge. No scleral icterus.  Neck: Normal range of motion. Neck supple. No JVD present. No tracheal deviation present. No thyromegaly present.  Cardiovascular: Normal rate, regular rhythm, normal heart sounds and intact distal pulses.  Exam reveals no gallop and no friction rub.   No murmur heard. Pulmonary/Chest: Effort normal and breath sounds normal. No stridor. No respiratory distress. She has no wheezes. She has no rales. She exhibits no tenderness.  Abdominal: Soft. Bowel sounds are normal. She exhibits no distension and no mass. There is no tenderness. There is no rebound and no guarding.  Musculoskeletal: Normal range of motion. She exhibits no edema and no tenderness.       Lumbar back: Normal.       - SLR in BLE  Lymphadenopathy:    She has no cervical adenopathy.  Neurological: She is alert and oriented to person, place, and time. She has normal reflexes. She displays normal reflexes. No cranial nerve deficit. She exhibits normal muscle tone. Coordination normal.  Skin: Skin is warm and dry. No rash noted. She is not diaphoretic. No erythema. No pallor.  Psychiatric: She has a normal mood and affect.  Her behavior is normal.  Judgment and thought content normal.     Lab Results  Component Value Date   WBC 4.4* 10/06/2011   HGB 10.9* 10/06/2011   HCT 32.8* 10/06/2011   PLT 252.0 10/06/2011   GLUCOSE 99 10/06/2011   CHOL 147 10/06/2011   TRIG 61.0 10/06/2011   HDL 56.90 10/06/2011   LDLCALC 78 10/06/2011   ALT 16 10/06/2011   AST 19 10/06/2011   NA 141 10/06/2011   K 3.6 10/06/2011   CL 105 10/06/2011   CREATININE 0.9 10/06/2011   BUN 22 10/06/2011   CO2 29 10/06/2011   TSH 1.79 10/06/2011   HGBA1C 6.6* 10/06/2011       Assessment & Plan:

## 2011-11-24 NOTE — Assessment & Plan Note (Signed)
No meds needed, she will work on lifestyle modifications

## 2011-11-24 NOTE — Assessment & Plan Note (Signed)
She will continue current meds for pain 

## 2011-11-24 NOTE — Assessment & Plan Note (Signed)
Continue current meds for pain 

## 2011-11-24 NOTE — Patient Instructions (Signed)
Back Pain, Adult Low back pain is very common. About 1 in 5 people have back pain.The cause of low back pain is rarely dangerous. The pain often gets better over time.About half of people with a sudden onset of back pain feel better in just 2 weeks. About 8 in 10 people feel better by 6 weeks.  CAUSES Some common causes of back pain include:  Strain of the muscles or ligaments supporting the spine.   Wear and tear (degeneration) of the spinal discs.   Arthritis.   Direct injury to the back.  DIAGNOSIS Most of the time, the direct cause of low back pain is not known.However, back pain can be treated effectively even when the exact cause of the pain is unknown.Answering your caregiver's questions about your overall health and symptoms is one of the most accurate ways to make sure the cause of your pain is not dangerous. If your caregiver needs more information, he or she may order lab work or imaging tests (X-rays or MRIs).However, even if imaging tests show changes in your back, this usually does not require surgery. HOME CARE INSTRUCTIONS For many people, back pain returns.Since low back pain is rarely dangerous, it is often a condition that people can learn to manageon their own.   Remain active. It is stressful on the back to sit or stand in one place. Do not sit, drive, or stand in one place for more than 30 minutes at a time. Take short walks on level surfaces as soon as pain allows.Try to increase the length of time you walk each day.   Do not stay in bed.Resting more than 1 or 2 days can delay your recovery.   Do not avoid exercise or work.Your body is made to move.It is not dangerous to be active, even though your back may hurt.Your back will likely heal faster if you return to being active before your pain is gone.   Pay attention to your body when you bend and lift. Many people have less discomfortwhen lifting if they bend their knees, keep the load close to their  bodies,and avoid twisting. Often, the most comfortable positions are those that put less stress on your recovering back.   Find a comfortable position to sleep. Use a firm mattress and lie on your side with your knees slightly bent. If you lie on your back, put a pillow under your knees.   Only take over-the-counter or prescription medicines as directed by your caregiver. Over-the-counter medicines to reduce pain and inflammation are often the most helpful.Your caregiver may prescribe muscle relaxant drugs.These medicines help dull your pain so you can more quickly return to your normal activities and healthy exercise.   Put ice on the injured area.   Put ice in a plastic bag.   Place a towel between your skin and the bag.   Leave the ice on for 15 to 20 minutes, 3 to 4 times a day for the first 2 to 3 days. After that, ice and heat may be alternated to reduce pain and spasms.   Ask your caregiver about trying back exercises and gentle massage. This may be of some benefit.   Avoid feeling anxious or stressed.Stress increases muscle tension and can worsen back pain.It is important to recognize when you are anxious or stressed and learn ways to manage it.Exercise is a great option.  SEEK MEDICAL CARE IF:  You have pain that is not relieved with rest or medicine.   You have   pain that does not improve in 1 week.   You have new symptoms.   You are generally not feeling well.  SEEK IMMEDIATE MEDICAL CARE IF:   You have pain that radiates from your back into your legs.   You develop new bowel or bladder control problems.   You have unusual weakness or numbness in your arms or legs.   You develop nausea or vomiting.   You develop abdominal pain.   You feel faint.  Document Released: 03/16/2005 Document Revised: 03/05/2011 Document Reviewed: 08/04/2010 Exit[employer] Patient Information 2012 [employer]. 

## 2011-11-24 NOTE — Assessment & Plan Note (Signed)
Her BP is well controlled 

## 2011-11-24 NOTE — Assessment & Plan Note (Signed)
Anemia is stable.

## 2011-12-08 ENCOUNTER — Ambulatory Visit (INDEPENDENT_AMBULATORY_CARE_PROVIDER_SITE_OTHER)
Admission: RE | Admit: 2011-12-08 | Discharge: 2011-12-08 | Disposition: A | Discharge status: Home / Self Care | Payer: Medicare Other | Source: Ambulatory Visit

## 2011-12-08 DIAGNOSIS — M899 Disorder of bone, unspecified: Secondary | ICD-10-CM

## 2011-12-08 DIAGNOSIS — M949 Disorder of cartilage, unspecified: Secondary | ICD-10-CM

## 2011-12-20 ENCOUNTER — Encounter: Payer: Self-pay | Admitting: Internal Medicine

## 2011-12-20 LAB — HM DEXA SCAN: HM Dexa Scan: NORMAL

## 2012-08-24 ENCOUNTER — Other Ambulatory Visit: Payer: Self-pay | Admitting: Internal Medicine

## 2012-08-24 DIAGNOSIS — Z1231 Encounter for screening mammogram for malignant neoplasm of breast: Secondary | ICD-10-CM

## 2012-09-20 ENCOUNTER — Ambulatory Visit (HOSPITAL_COMMUNITY)
Admission: RE | Admit: 2012-09-20 | Discharge: 2012-09-20 | Disposition: A | Discharge status: Home / Self Care | Payer: Medicare Other | Source: Ambulatory Visit | Attending: Internal Medicine | Admitting: Internal Medicine

## 2012-09-20 DIAGNOSIS — Z1231 Encounter for screening mammogram for malignant neoplasm of breast: Secondary | ICD-10-CM | POA: Insufficient documentation

## 2012-10-28 ENCOUNTER — Other Ambulatory Visit (INDEPENDENT_AMBULATORY_CARE_PROVIDER_SITE_OTHER): Payer: Medicare Other

## 2012-10-28 ENCOUNTER — Ambulatory Visit (INDEPENDENT_AMBULATORY_CARE_PROVIDER_SITE_OTHER): Payer: Medicare Other | Admitting: Internal Medicine

## 2012-10-28 ENCOUNTER — Encounter: Payer: Self-pay | Admitting: Internal Medicine

## 2012-10-28 VITALS — BP 108/74 | HR 88 | Temp 98.1°F | Resp 16 | Ht 66.0 in | Wt 198.0 lb

## 2012-10-28 DIAGNOSIS — D509 Iron deficiency anemia, unspecified: Secondary | ICD-10-CM

## 2012-10-28 DIAGNOSIS — I10 Essential (primary) hypertension: Secondary | ICD-10-CM

## 2012-10-28 DIAGNOSIS — E669 Obesity, unspecified: Secondary | ICD-10-CM | POA: Insufficient documentation

## 2012-10-28 DIAGNOSIS — E66811 Obesity, class 1: Secondary | ICD-10-CM

## 2012-10-28 DIAGNOSIS — E559 Vitamin D deficiency, unspecified: Secondary | ICD-10-CM

## 2012-10-28 DIAGNOSIS — M51369 Other intervertebral disc degeneration, lumbar region without mention of lumbar back pain or lower extremity pain: Secondary | ICD-10-CM

## 2012-10-28 DIAGNOSIS — M5136 Other intervertebral disc degeneration, lumbar region: Secondary | ICD-10-CM

## 2012-10-28 DIAGNOSIS — IMO0001 Reserved for inherently not codable concepts without codable children: Secondary | ICD-10-CM

## 2012-10-28 DIAGNOSIS — M949 Disorder of cartilage, unspecified: Secondary | ICD-10-CM

## 2012-10-28 DIAGNOSIS — M899 Disorder of bone, unspecified: Secondary | ICD-10-CM

## 2012-10-28 DIAGNOSIS — E785 Hyperlipidemia, unspecified: Secondary | ICD-10-CM

## 2012-10-28 DIAGNOSIS — M51379 Other intervertebral disc degeneration, lumbosacral region without mention of lumbar back pain or lower extremity pain: Secondary | ICD-10-CM

## 2012-10-28 DIAGNOSIS — M5137 Other intervertebral disc degeneration, lumbosacral region: Secondary | ICD-10-CM

## 2012-10-28 DIAGNOSIS — M545 Low back pain, unspecified: Secondary | ICD-10-CM

## 2012-10-28 DIAGNOSIS — Z0000 Encounter for general adult medical examination without abnormal findings: Secondary | ICD-10-CM

## 2012-10-28 DIAGNOSIS — M1990 Unspecified osteoarthritis, unspecified site: Secondary | ICD-10-CM

## 2012-10-28 LAB — URINALYSIS, ROUTINE W REFLEX MICROSCOPIC
Nitrite: NEGATIVE
Total Protein, Urine: NEGATIVE
pH: 5.5 (ref 5.0–8.0)

## 2012-10-28 LAB — IBC PANEL
Iron: 64 ug/dL (ref 42–145)
Saturation Ratios: 17.6 % — ABNORMAL LOW (ref 20.0–50.0)

## 2012-10-28 LAB — COMPREHENSIVE METABOLIC PANEL
AST: 15 U/L (ref 0–37)
Albumin: 3.8 g/dL (ref 3.5–5.2)
BUN: 18 mg/dL (ref 6–23)
Calcium: 9.2 mg/dL (ref 8.4–10.5)
Chloride: 103 mEq/L (ref 96–112)
Glucose, Bld: 98 mg/dL (ref 70–99)
Potassium: 3.5 mEq/L (ref 3.5–5.1)
Sodium: 139 mEq/L (ref 135–145)
Total Protein: 7.7 g/dL (ref 6.0–8.3)

## 2012-10-28 LAB — CBC WITH DIFFERENTIAL/PLATELET
Basophils Relative: 0.3 % (ref 0.0–3.0)
Eosinophils Relative: 4.3 % (ref 0.0–5.0)
Lymphocytes Relative: 33.9 % (ref 12.0–46.0)
Neutrophils Relative %: 53.8 % (ref 43.0–77.0)
Platelets: 278 10*3/uL (ref 150.0–400.0)
RBC: 4.32 Mil/uL (ref 3.87–5.11)
WBC: 4.4 10*3/uL — ABNORMAL LOW (ref 4.5–10.5)

## 2012-10-28 LAB — LIPID PANEL: Total CHOL/HDL Ratio: 2

## 2012-10-28 LAB — HM DIABETES FOOT EXAM

## 2012-10-28 LAB — HEMOGLOBIN A1C: Hgb A1c MFr Bld: 6.8 % — ABNORMAL HIGH (ref 4.6–6.5)

## 2012-10-28 LAB — TSH: TSH: 2.35 u[IU]/mL (ref 0.35–5.50)

## 2012-10-28 MED ORDER — CYCLOBENZAPRINE HCL 10 MG PO TABS: 10.0000 mg | ORAL_TABLET | Freq: Two times a day (BID) | ORAL | Status: DC | PRN

## 2012-10-28 MED ORDER — METOPROLOL SUCCINATE ER 50 MG PO TB24: 50.0000 mg | ORAL_TABLET | Freq: Every day | ORAL | Status: DC

## 2012-10-28 MED ORDER — ATORVASTATIN CALCIUM 20 MG PO TABS: 20.0000 mg | ORAL_TABLET | Freq: Every day | ORAL | Status: DC

## 2012-10-28 MED ORDER — HYDROCODONE-ACETAMINOPHEN 7.5-325 MG PO TABS: 1.0000 | ORAL_TABLET | Freq: Three times a day (TID) | ORAL | Status: DC | PRN

## 2012-10-28 MED ORDER — VALSARTAN-HYDROCHLOROTHIAZIDE 160-25 MG PO TABS: 1.0000 | ORAL_TABLET | Freq: Every day | ORAL | Status: DC

## 2012-10-28 NOTE — Progress Notes (Signed)
  Subjective:    Patient ID: Alexandria [name], female    DOB: 04-Jun-1940, 72 y.o.   MRN: [id number]  Anemia Presents for follow-up visit. There has been no abdominal pain, anorexia, bruising/bleeding easily, confusion, fever, leg swelling, light-headedness, malaise/fatigue, pallor, palpitations, paresthesias, pica or weight loss. Signs of blood loss that are not present include hematemesis, hematochezia, melena and vaginal bleeding. There are no compliance problems.       Review of Systems  Constitutional: Negative.  Negative for fever, chills, weight loss, malaise/fatigue, diaphoresis, fatigue and unexpected weight change.  HENT: Negative.   Eyes: Negative.   Respiratory: Negative.  Negative for cough, chest tightness, shortness of breath, wheezing and stridor.   Cardiovascular: Negative.  Negative for chest pain, palpitations and leg swelling.  Gastrointestinal: Negative.  Negative for nausea, abdominal pain, diarrhea, constipation, blood in stool, melena, hematochezia, anorexia and hematemesis.  Endocrine: Negative.  Negative for polydipsia, polyphagia and polyuria.  Genitourinary: Negative.  Negative for urgency, hematuria, flank pain, vaginal bleeding and pelvic pain.  Musculoskeletal: Positive for back pain and arthralgias. Negative for myalgias, joint swelling and gait problem.  Skin: Negative.  Negative for pallor.  Allergic/Immunologic: Negative.   Neurological: Negative.  Negative for light-headedness and paresthesias.  Hematological: Negative.  Negative for adenopathy. Does not bruise/bleed easily.  Psychiatric/Behavioral: Negative.  Negative for confusion.       Objective:   Physical Exam  Vitals reviewed. Constitutional: She is oriented to person, place, and time. She appears well-developed and well-nourished. No distress.  HENT:  Head: Normocephalic and atraumatic.  Mouth/Throat: Oropharynx is clear and moist. No oropharyngeal exudate.  Eyes: Conjunctivae are normal. Right  eye exhibits no discharge. Left eye exhibits no discharge. No scleral icterus.  Neck: Normal range of motion. Neck supple. No JVD present. No tracheal deviation present. No thyromegaly present.  Cardiovascular: Normal rate, regular rhythm, normal heart sounds and intact distal pulses.  Exam reveals no gallop and no friction rub.   No murmur heard. Pulmonary/Chest: Effort normal and breath sounds normal. No stridor. No respiratory distress. She has no wheezes. She has no rales. She exhibits no tenderness.  Abdominal: Soft. Bowel sounds are normal. She exhibits no distension and no mass. There is no tenderness. There is no rebound and no guarding.  Musculoskeletal: Normal range of motion. She exhibits no edema and no tenderness.  Lymphadenopathy:    She has no cervical adenopathy.  Neurological: She is alert and oriented to person, place, and time. She has normal reflexes. She displays normal reflexes. No cranial nerve deficit. She exhibits normal muscle tone. Coordination normal.  Skin: Skin is warm and dry. No rash noted. She is not diaphoretic. No erythema. No pallor.  Psychiatric: Her behavior is normal. Judgment and thought content normal.     Lab Results  Component Value Date   WBC 4.4* 10/06/2011   HGB 10.9* 10/06/2011   HCT 32.8* 10/06/2011   PLT 252.0 10/06/2011   GLUCOSE 99 10/06/2011   CHOL 147 10/06/2011   TRIG 61.0 10/06/2011   HDL 56.90 10/06/2011   LDLCALC 78 10/06/2011   ALT 16 10/06/2011   AST 19 10/06/2011   NA 141 10/06/2011   K 3.6 10/06/2011   CL 105 10/06/2011   CREATININE 0.9 10/06/2011   BUN 22 10/06/2011   CO2 29 10/06/2011   TSH 1.79 10/06/2011   HGBA1C 6.6* 10/06/2011       Assessment & Plan:

## 2012-10-28 NOTE — Patient Instructions (Signed)
Type 2 Diabetes Mellitus, Adult Type 2 diabetes mellitus, often simply referred to as type 2 diabetes, is a long-lasting (chronic) disease. In type 2 diabetes, the pancreas does not make enough insulin (a hormone), the cells are less responsive to the insulin that is made (insulin resistance), or both. Normally, insulin moves sugars from food into the tissue cells. The tissue cells use the sugars for energy. The lack of insulin or the lack of normal response to insulin causes excess sugars to build up in the blood instead of going into the tissue cells. As a result, high blood sugar (hyperglycemia) develops. The effect of high sugar (glucose) levels can cause many complications. Type 2 diabetes was also previously called adult-onset diabetes but it can occur at any age.  RISK FACTORS  A person is predisposed to developing type 2 diabetes if someone in the family has the disease and also has one or more of the following primary risk factors:  Overweight.  An inactive lifestyle.  A history of consistently eating high-calorie foods. Maintaining a normal weight and regular physical activity can reduce the chance of developing type 2 diabetes. SYMPTOMS  A person with type 2 diabetes may not show symptoms initially. The symptoms of type 2 diabetes appear slowly. The symptoms include:  Increased thirst (polydipsia).  Increased urination (polyuria).  Increased urination during the night (nocturia).  Weight loss. This weight loss may be rapid.  Frequent, recurring infections.  Tiredness (fatigue).  Weakness.  Vision changes, such as blurred vision.  Fruity smell to your breath.  Abdominal pain.  Nausea or vomiting.  Cuts or bruises which are slow to heal.  Tingling or numbness in the hands or feet. DIAGNOSIS Type 2 diabetes is frequently not diagnosed until complications of diabetes are present. Type 2 diabetes is diagnosed when symptoms or complications are present and when blood  glucose levels are increased. Your blood glucose level may be checked by one or more of the following blood tests:  A fasting blood glucose test. You will not be allowed to eat for at least 8 hours before a blood sample is taken.  A random blood glucose test. Your blood glucose is checked at any time of the day regardless of when you ate.  A hemoglobin A1c blood glucose test. A hemoglobin A1c test provides information about blood glucose control over the previous 3 months.  An oral glucose tolerance test (OGTT). Your blood glucose is measured after you have not eaten (fasted) for 2 hours and then after you drink a glucose-containing beverage. TREATMENT   You may need to take insulin or diabetes medicine daily to keep blood glucose levels in the desired range.  You will need to match insulin dosing with exercise and healthy food choices. The treatment goal is to maintain the before meal blood sugar (preprandial glucose) level at 70 130 mg/dL. HOME CARE INSTRUCTIONS   Have your hemoglobin A1c level checked twice a year.  Perform daily blood glucose monitoring as directed by your caregiver.  Monitor urine ketones when you are ill and as directed by your caregiver.  Take your diabetes medicine or insulin as directed by your caregiver to maintain your blood glucose levels in the desired range.  Never run out of diabetes medicine or insulin. It is needed every day.  Adjust insulin based on your intake of carbohydrates. Carbohydrates can raise blood glucose levels but need to be included in your diet. Carbohydrates provide vitamins, minerals, and fiber which are an essential part of   a healthy diet. Carbohydrates are found in fruits, vegetables, whole grains, dairy products, legumes, and foods containing added sugars.    Eat healthy foods. Alternate 3 meals with 3 snacks.  Lose weight if overweight.  Carry a medical alert card or wear your medical alert jewelry.  Carry a 15 gram  carbohydrate snack with you at all times to treat low blood glucose (hypoglycemia). Some examples of 15 gram carbohydrate snacks include:  Glucose tablets, 3 or 4   Glucose gel, 15 gram tube  Raisins, 2 tablespoons (24 grams)  Jelly beans, 6  Animal crackers, 8  Regular pop, 4 ounces (120 mL)  Gummy treats, 9  Recognize hypoglycemia. Hypoglycemia occurs with blood glucose levels of 70 mg/dL and below. The risk for hypoglycemia increases when fasting or skipping meals, during or after intense exercise, and during sleep. Hypoglycemia symptoms can include:  Tremors or shakes.  Decreased ability to concentrate.  Sweating.  Increased heart rate.  Headache.  Dry mouth.  Hunger.  Irritability.  Anxiety.  Restless sleep.  Altered speech or coordination.  Confusion.  Treat hypoglycemia promptly. If you are alert and able to safely swallow, follow the 15:15 rule:  Take 15 20 grams of rapid-acting glucose or carbohydrate. Rapid-acting options include glucose gel, glucose tablets, or 4 ounces (120 mL) of fruit juice, regular soda, or low fat milk.  Check your blood glucose level 15 minutes after taking the glucose.  Take 15 20 grams more of glucose if the repeat blood glucose level is still 70 mg/dL or below.  Eat a meal or snack within 1 hour once blood glucose levels return to normal.    Be alert to polyuria and polydipsia which are early signs of hyperglycemia. An early awareness of hyperglycemia allows for prompt treatment. Treat hyperglycemia as directed by your caregiver.  Engage in at least 150 minutes of moderate-intensity physical activity a week, spread over at least 3 days of the week or as directed by your caregiver. In addition, you should engage in resistance exercise at least 2 times a week or as directed by your caregiver.  Adjust your medicine and food intake as needed if you start a new exercise or sport.  Follow your sick day plan at any time you  are unable to eat or drink as usual.  Avoid tobacco use.  Limit alcohol intake to no more than 1 drink per day for nonpregnant women and 2 drinks per day for men. You should drink alcohol only when you are also eating food. Talk with your caregiver whether alcohol is safe for you. Tell your caregiver if you drink alcohol several times a week.  Follow up with your caregiver regularly.  Schedule an eye exam soon after the diagnosis of type 2 diabetes and then annually.  Perform daily skin and foot care. Examine your skin and feet daily for cuts, bruises, redness, nail problems, bleeding, blisters, or sores. A foot exam by a caregiver should be done annually.  Brush your teeth and gums at least twice a day and floss at least once a day. Follow up with your dentist regularly.  Share your diabetes management plan with your workplace or school.  Stay up-to-date with immunizations.  Learn to manage stress.  Obtain ongoing diabetes education and support as needed.  Participate in, or seek rehabilitation as needed to maintain or improve independence and quality of life. Request a physical or occupational therapy referral if you are having foot or hand numbness or difficulties with grooming,   dressing, eating, or physical activity. SEEK MEDICAL CARE IF:   You are unable to eat food or drink fluids for more than 6 hours.  You have nausea and vomiting for more than 6 hours.  Your blood glucose level is over 240 mg/dL.  There is a change in mental status.  You develop an additional serious illness.  You have diarrhea for more than 6 hours.  You have been sick or have had a fever for a couple of days and are not getting better.  You have pain during any physical activity.  SEEK IMMEDIATE MEDICAL CARE IF:  You have difficulty breathing.  You have moderate to large ketone levels. MAKE SURE YOU:  Understand these instructions.  Will watch your condition.  Will get help right away if  you are not doing well or get worse. Document Released: 03/16/2005 Document Revised: 12/09/2011 Document Reviewed: 10/13/2011 Exit[employer] Patient Information 2014 [employer].  

## 2012-10-30 ENCOUNTER — Encounter: Payer: Self-pay | Admitting: Internal Medicine

## 2012-10-30 DIAGNOSIS — E559 Vitamin D deficiency, unspecified: Secondary | ICD-10-CM | POA: Insufficient documentation

## 2012-10-30 MED ORDER — CHOLECALCIFEROL 1.25 MG (50000 UT) PO TABS: 1.0000 | ORAL_TABLET | ORAL | Status: DC

## 2012-10-30 NOTE — Assessment & Plan Note (Signed)
Improvement noted today.

## 2012-10-30 NOTE — Assessment & Plan Note (Signed)
Goal achieved 

## 2012-10-30 NOTE — Assessment & Plan Note (Signed)
She will start cholecalciferol

## 2012-10-30 NOTE — Assessment & Plan Note (Addendum)
Her A1C is up a little but she does not need medication yet

## 2012-10-30 NOTE — Assessment & Plan Note (Signed)
She will continue the current meds for pain 

## 2012-10-30 NOTE — Assessment & Plan Note (Addendum)

## 2012-10-30 NOTE — Assessment & Plan Note (Signed)
Her BP is well controlled 

## 2012-10-30 NOTE — Assessment & Plan Note (Signed)
Her Vit D is low will treat

## 2012-10-30 NOTE — Assessment & Plan Note (Signed)
She is working on her lifestyle modifications 

## 2012-11-24 ENCOUNTER — Telehealth: Payer: Self-pay | Admitting: *Deleted

## 2012-11-24 DIAGNOSIS — M1990 Unspecified osteoarthritis, unspecified site: Secondary | ICD-10-CM

## 2012-11-24 DIAGNOSIS — M5136 Other intervertebral disc degeneration, lumbar region: Secondary | ICD-10-CM

## 2012-11-24 DIAGNOSIS — M545 Low back pain: Secondary | ICD-10-CM

## 2012-11-24 MED ORDER — HYDROCODONE-ACETAMINOPHEN 7.5-325 MG PO TABS: 1.0000 | ORAL_TABLET | Freq: Three times a day (TID) | ORAL | Status: DC | PRN

## 2012-11-24 NOTE — Telephone Encounter (Signed)
Pt called states she has lost the hard copy of the Rx that was written on 8.1.2014.  She is requesting they be reprinted so shse can send them in to the pharmacy.  Please advise

## 2012-11-24 NOTE — Telephone Encounter (Signed)
done

## 2012-11-25 ENCOUNTER — Other Ambulatory Visit: Payer: Self-pay

## 2012-11-25 DIAGNOSIS — M5136 Other intervertebral disc degeneration, lumbar region: Secondary | ICD-10-CM

## 2012-11-25 DIAGNOSIS — I10 Essential (primary) hypertension: Secondary | ICD-10-CM

## 2012-11-25 DIAGNOSIS — M1990 Unspecified osteoarthritis, unspecified site: Secondary | ICD-10-CM

## 2012-11-25 DIAGNOSIS — M545 Low back pain: Secondary | ICD-10-CM

## 2012-11-25 DIAGNOSIS — E785 Hyperlipidemia, unspecified: Secondary | ICD-10-CM

## 2012-11-25 MED ORDER — METOPROLOL SUCCINATE ER 50 MG PO TB24: 50.0000 mg | ORAL_TABLET | Freq: Every day | ORAL | Status: DC

## 2012-11-25 MED ORDER — ATORVASTATIN CALCIUM 20 MG PO TABS: 20.0000 mg | ORAL_TABLET | Freq: Every day | ORAL | Status: DC

## 2012-11-25 MED ORDER — CYCLOBENZAPRINE HCL 10 MG PO TABS: 10.0000 mg | ORAL_TABLET | Freq: Two times a day (BID) | ORAL | Status: DC | PRN

## 2012-11-25 MED ORDER — HYDROCODONE-ACETAMINOPHEN 7.5-325 MG PO TABS: 1.0000 | ORAL_TABLET | Freq: Three times a day (TID) | ORAL | Status: DC | PRN

## 2012-11-25 NOTE — Telephone Encounter (Signed)
All meds reprinted for pt to pick up

## 2013-06-09 DIAGNOSIS — H4011X0 Primary open-angle glaucoma, stage unspecified: Secondary | ICD-10-CM | POA: Diagnosis not present

## 2013-08-22 ENCOUNTER — Other Ambulatory Visit: Payer: Self-pay | Admitting: Internal Medicine

## 2013-08-22 DIAGNOSIS — Z1231 Encounter for screening mammogram for malignant neoplasm of breast: Secondary | ICD-10-CM

## 2013-09-20 ENCOUNTER — Other Ambulatory Visit: Payer: Self-pay | Admitting: Internal Medicine

## 2013-09-20 ENCOUNTER — Ambulatory Visit (HOSPITAL_COMMUNITY)
Admission: RE | Admit: 2013-09-20 | Discharge: 2013-09-20 | Disposition: A | Discharge status: Home / Self Care | Payer: Medicare Other | Source: Ambulatory Visit | Attending: Internal Medicine | Admitting: Internal Medicine

## 2013-09-20 DIAGNOSIS — Z1231 Encounter for screening mammogram for malignant neoplasm of breast: Secondary | ICD-10-CM

## 2013-09-20 DIAGNOSIS — Z0000 Encounter for general adult medical examination without abnormal findings: Secondary | ICD-10-CM

## 2013-09-21 ENCOUNTER — Ambulatory Visit (HOSPITAL_COMMUNITY): Payer: Medicare Other

## 2013-09-22 ENCOUNTER — Ambulatory Visit (HOSPITAL_COMMUNITY)
Admission: RE | Admit: 2013-09-22 | Discharge: 2013-09-22 | Disposition: A | Discharge status: Home / Self Care | Payer: Medicare Other | Source: Ambulatory Visit | Attending: Internal Medicine | Admitting: Internal Medicine

## 2013-09-22 DIAGNOSIS — Z1231 Encounter for screening mammogram for malignant neoplasm of breast: Secondary | ICD-10-CM | POA: Diagnosis not present

## 2013-09-25 LAB — HM MAMMOGRAPHY: HM MAMMO: NORMAL

## 2013-12-21 ENCOUNTER — Other Ambulatory Visit (INDEPENDENT_AMBULATORY_CARE_PROVIDER_SITE_OTHER): Payer: Medicare Other

## 2013-12-21 ENCOUNTER — Encounter: Payer: Self-pay | Admitting: Internal Medicine

## 2013-12-21 ENCOUNTER — Ambulatory Visit (INDEPENDENT_AMBULATORY_CARE_PROVIDER_SITE_OTHER): Payer: Medicare Other | Admitting: Internal Medicine

## 2013-12-21 VITALS — BP 130/78 | HR 66 | Temp 98.2°F | Resp 16 | Ht 66.0 in

## 2013-12-21 DIAGNOSIS — E785 Hyperlipidemia, unspecified: Secondary | ICD-10-CM | POA: Diagnosis not present

## 2013-12-21 DIAGNOSIS — IMO0001 Reserved for inherently not codable concepts without codable children: Secondary | ICD-10-CM

## 2013-12-21 DIAGNOSIS — K2100 Gastro-esophageal reflux disease with esophagitis, without bleeding: Secondary | ICD-10-CM | POA: Insufficient documentation

## 2013-12-21 DIAGNOSIS — E559 Vitamin D deficiency, unspecified: Secondary | ICD-10-CM

## 2013-12-21 DIAGNOSIS — D509 Iron deficiency anemia, unspecified: Secondary | ICD-10-CM

## 2013-12-21 DIAGNOSIS — E1165 Type 2 diabetes mellitus with hyperglycemia: Secondary | ICD-10-CM

## 2013-12-21 DIAGNOSIS — Z23 Encounter for immunization: Secondary | ICD-10-CM | POA: Diagnosis not present

## 2013-12-21 DIAGNOSIS — N6019 Diffuse cystic mastopathy of unspecified breast: Secondary | ICD-10-CM | POA: Diagnosis not present

## 2013-12-21 DIAGNOSIS — M545 Low back pain, unspecified: Secondary | ICD-10-CM

## 2013-12-21 DIAGNOSIS — H409 Unspecified glaucoma: Secondary | ICD-10-CM

## 2013-12-21 DIAGNOSIS — I10 Essential (primary) hypertension: Secondary | ICD-10-CM

## 2013-12-21 DIAGNOSIS — Z0000 Encounter for general adult medical examination without abnormal findings: Secondary | ICD-10-CM | POA: Diagnosis not present

## 2013-12-21 DIAGNOSIS — M5137 Other intervertebral disc degeneration, lumbosacral region: Secondary | ICD-10-CM

## 2013-12-21 DIAGNOSIS — M5136 Other intervertebral disc degeneration, lumbar region: Secondary | ICD-10-CM

## 2013-12-21 LAB — CBC WITH DIFFERENTIAL/PLATELET
BASOS ABS: 0 10*3/uL (ref 0.0–0.1)
Basophils Relative: 0.3 % (ref 0.0–3.0)
Eosinophils Absolute: 0.1 10*3/uL (ref 0.0–0.7)
Eosinophils Relative: 2.9 % (ref 0.0–5.0)
HCT: 36.6 % (ref 36.0–46.0)
Hemoglobin: 11.9 g/dL — ABNORMAL LOW (ref 12.0–15.0)
LYMPHS PCT: 45.6 % (ref 12.0–46.0)
Lymphs Abs: 2 10*3/uL (ref 0.7–4.0)
MCHC: 32.4 g/dL (ref 30.0–36.0)
MCV: 86.3 fl (ref 78.0–100.0)
Monocytes Absolute: 0.4 10*3/uL (ref 0.1–1.0)
Monocytes Relative: 8.3 % (ref 3.0–12.0)
Neutro Abs: 1.9 10*3/uL (ref 1.4–7.7)
Neutrophils Relative %: 42.9 % — ABNORMAL LOW (ref 43.0–77.0)
Platelets: 294 10*3/uL (ref 150.0–400.0)
RBC: 4.24 Mil/uL (ref 3.87–5.11)
RDW: 16.3 % — AB (ref 11.5–15.5)
WBC: 4.4 10*3/uL (ref 4.0–10.5)

## 2013-12-21 LAB — LIPID PANEL
Cholesterol: 145 mg/dL (ref 0–200)
HDL: 53.1 mg/dL (ref >39.00)
LDL Cholesterol: 76 mg/dL (ref 0–99)
NonHDL: 91.9
TRIGLYCERIDES: 81 mg/dL (ref 0.0–149.0)
Total CHOL/HDL Ratio: 3
VLDL: 16.2 mg/dL (ref 0.0–40.0)

## 2013-12-21 LAB — IBC PANEL
IRON: 52 ug/dL (ref 42–145)
Saturation Ratios: 14.5 % — ABNORMAL LOW (ref 20.0–50.0)
Transferrin: 255.7 mg/dL (ref 212.0–360.0)

## 2013-12-21 LAB — HEMOGLOBIN A1C: Hgb A1c MFr Bld: 6.7 % — ABNORMAL HIGH (ref 4.6–6.5)

## 2013-12-21 LAB — COMPREHENSIVE METABOLIC PANEL
ALT: 19 U/L (ref 0–35)
AST: 17 U/L (ref 0–37)
Albumin: 3.9 g/dL (ref 3.5–5.2)
Alkaline Phosphatase: 52 U/L (ref 39–117)
BUN: 18 mg/dL (ref 6–23)
CALCIUM: 9.2 mg/dL (ref 8.4–10.5)
CHLORIDE: 103 meq/L (ref 96–112)
CO2: 27 meq/L (ref 19–32)
CREATININE: 0.9 mg/dL (ref 0.4–1.2)
GFR: 81.33 mL/min (ref >60.00)
GLUCOSE: 100 mg/dL — AB (ref 70–99)
Potassium: 3.8 mEq/L (ref 3.5–5.1)
Sodium: 138 mEq/L (ref 135–145)
Total Bilirubin: 0.6 mg/dL (ref 0.2–1.2)
Total Protein: 7.7 g/dL (ref 6.0–8.3)

## 2013-12-21 LAB — FERRITIN: Ferritin: 114.3 ng/mL (ref 10.0–291.0)

## 2013-12-21 LAB — TSH: TSH: 1.23 u[IU]/mL (ref 0.35–4.50)

## 2013-12-21 MED ORDER — GLUCOSAMINE-CHONDROITIN 500-400 MG PO TABS: 1.0000 | ORAL_TABLET | Freq: Every day | ORAL | Status: DC

## 2013-12-21 MED ORDER — RANITIDINE HCL 300 MG PO TABS: 300.0000 mg | ORAL_TABLET | Freq: Every day | ORAL | Status: DC

## 2013-12-21 MED ORDER — ATORVASTATIN CALCIUM 20 MG PO TABS: 20.0000 mg | ORAL_TABLET | Freq: Every day | ORAL | Status: DC

## 2013-12-21 MED ORDER — IBUPROFEN 400 MG PO TABS: 400.0000 mg | ORAL_TABLET | Freq: Three times a day (TID) | ORAL | Status: DC | PRN

## 2013-12-21 MED ORDER — CHOLECALCIFEROL 1.25 MG (50000 UT) PO TABS: 1.0000 | ORAL_TABLET | ORAL | Status: DC

## 2013-12-21 MED ORDER — METOPROLOL SUCCINATE ER 50 MG PO TB24: 50.0000 mg | ORAL_TABLET | Freq: Every day | ORAL | Status: DC

## 2013-12-21 MED ORDER — VALSARTAN-HYDROCHLOROTHIAZIDE 160-25 MG PO TABS: 1.0000 | ORAL_TABLET | Freq: Every day | ORAL | Status: DC

## 2013-12-21 MED ORDER — LATANOPROST 0.005 % OP SOLN: 1.0000 [drp] | OPHTHALMIC | Status: DC

## 2013-12-21 MED ORDER — CYCLOBENZAPRINE HCL 10 MG PO TABS: 10.0000 mg | ORAL_TABLET | Freq: Two times a day (BID) | ORAL | Status: DC | PRN

## 2013-12-21 NOTE — Progress Notes (Signed)
Pre visit review using our clinic review tool, if applicable. No additional management support is needed unless otherwise documented below in the visit note. 

## 2013-12-21 NOTE — Patient Instructions (Signed)
Gastroesophageal Reflux Disease, Adult Gastroesophageal reflux disease (GERD) happens when acid from your stomach flows up into the esophagus. When acid comes in contact with the esophagus, the acid causes soreness (inflammation) in the esophagus. Over time, GERD may create small holes (ulcers) in the lining of the esophagus. CAUSES   Increased body weight. This puts pressure on the stomach, making acid rise from the stomach into the esophagus.  Smoking. This increases acid production in the stomach.  Drinking alcohol. This causes decreased pressure in the lower esophageal sphincter (valve or ring of muscle between the esophagus and stomach), allowing acid from the stomach into the esophagus.  Late evening meals and a full stomach. This increases pressure and acid production in the stomach.  A malformed lower esophageal sphincter. Sometimes, no cause is found. SYMPTOMS   Burning pain in the lower part of the mid-chest behind the breastbone and in the mid-stomach area. This may occur twice a week or more often.  Trouble swallowing.  Sore throat.  Dry cough.  Asthma-like symptoms including chest tightness, shortness of breath, or wheezing. DIAGNOSIS  Your caregiver may be able to diagnose GERD based on your symptoms. In some cases, X-rays and other tests may be done to check for complications or to check the condition of your stomach and esophagus. TREATMENT  Your caregiver may recommend over-the-counter or prescription medicines to help decrease acid production. Ask your caregiver before starting or adding any new medicines.  HOME CARE INSTRUCTIONS   Change the factors that you can control. Ask your caregiver for guidance concerning weight loss, quitting smoking, and alcohol consumption.  Avoid foods and drinks that make your symptoms worse, such as:  Caffeine or alcoholic drinks.  Chocolate.  Peppermint or mint flavorings.  Garlic and onions.  Spicy foods.  Citrus fruits,  such as oranges, lemons, or limes.  Tomato-based foods such as sauce, chili, salsa, and pizza.  Fried and fatty foods.  Avoid lying down for the 3 hours prior to your bedtime or prior to taking a nap.  Eat small, frequent meals instead of large meals.  Wear loose-fitting clothing. Do not wear anything tight around your waist that causes pressure on your stomach.  Raise the head of your bed 6 to 8 inches with wood blocks to help you sleep. Extra pillows will not help.  Only take over-the-counter or prescription medicines for pain, discomfort, or fever as directed by your caregiver.  Do not take aspirin, ibuprofen, or other nonsteroidal anti-inflammatory drugs (NSAIDs). SEEK IMMEDIATE MEDICAL CARE IF:   You have pain in your arms, neck, jaw, teeth, or back.  Your pain increases or changes in intensity or duration.  You develop nausea, vomiting, or sweating (diaphoresis).  You develop shortness of breath, or you faint.  Your vomit is green, yellow, black, or looks like coffee grounds or blood.  Your stool is red, bloody, or black. These symptoms could be signs of other problems, such as heart disease, gastric bleeding, or esophageal bleeding. MAKE SURE YOU:   Understand these instructions.  Will watch your condition.  Will get help right away if you are not doing well or get worse. Document Released: 12/24/2004 Document Revised: 06/08/2011 Document Reviewed: 10/03/2010 [employer] Patient Information 2015 [employer]. This information is not intended to replace advice given to you by your health care provider. Make sure you discuss any questions you have with your health care provider.  

## 2013-12-21 NOTE — Progress Notes (Signed)
Subjective:    Patient ID: Alexandria [name], female    DOB: 1941/02/21, 73 y.o.   MRN: [id number]  Back Pain This is a chronic problem. The current episode started more than 1 year ago. The problem occurs intermittently. The problem has been gradually worsening since onset. The pain is present in the lumbar spine. The quality of the pain is described as aching. The pain does not radiate. The pain is at a severity of 6/10. The pain is moderate. The pain is worse during the day. The symptoms are aggravated by bending, position and standing. Associated symptoms include pelvic pain and perianal numbness. Pertinent negatives include no abdominal pain, bladder incontinence, bowel incontinence, chest pain, dysuria, fever, headaches, leg pain, numbness, paresis, paresthesias, tingling, weakness or weight loss. Risk factors include lack of exercise and obesity. She has tried analgesics, NSAIDs and muscle relaxant for the symptoms. The treatment provided moderate relief.  Gastrophageal Reflux She reports no abdominal pain, no chest pain, no choking, no coughing or no nausea. This is a chronic problem. The current episode started more than 1 year ago. The problem occurs frequently. The problem has been unchanged. The symptoms are aggravated by caffeine and stress. Associated symptoms include anemia. Pertinent negatives include no fatigue, melena, muscle weakness, orthopnea or weight loss. Risk factors include NSAIDs. She has tried an antacid for the symptoms. The treatment provided mild relief.      Review of Systems  Constitutional: Negative.  Negative for fever, chills, weight loss, diaphoresis, activity change, appetite change, fatigue and unexpected weight change.  HENT: Negative.   Eyes: Negative.   Respiratory: Negative.  Negative for cough, choking, chest tightness, shortness of breath and stridor.   Cardiovascular: Negative.  Negative for chest pain, palpitations and leg swelling.  Gastrointestinal:  Negative.  Negative for nausea, vomiting, abdominal pain, diarrhea, constipation, blood in stool, melena, abdominal distention, anal bleeding, rectal pain and bowel incontinence.  Endocrine: Negative.  Negative for polydipsia, polyphagia and polyuria.  Genitourinary: Positive for pelvic pain. Negative for bladder incontinence and dysuria.  Musculoskeletal: Positive for back pain. Negative for muscle weakness.  Skin: Negative.  Negative for rash.  Allergic/Immunologic: Negative.   Neurological: Negative.  Negative for tingling, weakness, numbness, headaches and paresthesias.  Hematological: Negative.  Negative for adenopathy. Does not bruise/bleed easily.  Psychiatric/Behavioral: Negative.        Objective:   Physical Exam  Vitals reviewed. Constitutional: She is oriented to person, place, and time. She appears well-developed and well-nourished. No distress.  HENT:  Head: Normocephalic and atraumatic.  Mouth/Throat: Oropharynx is clear and moist. No oropharyngeal exudate.  Eyes: Conjunctivae are normal. Right eye exhibits no discharge. Left eye exhibits no discharge. No scleral icterus.  Neck: Normal range of motion. Neck supple. No JVD present. No tracheal deviation present. No thyromegaly present.  Cardiovascular: Normal rate, regular rhythm, normal heart sounds and intact distal pulses.  Exam reveals no gallop and no friction rub.   No murmur heard. Pulmonary/Chest: Effort normal and breath sounds normal. No stridor. No respiratory distress. She has no wheezes. She has no rales. She exhibits no tenderness.  Abdominal: Soft. Bowel sounds are normal. She exhibits no distension and no mass. There is no tenderness. There is no rebound and no guarding.  Musculoskeletal: Normal range of motion. She exhibits no edema and no tenderness.  Lymphadenopathy:    She has no cervical adenopathy.  Neurological: She is alert and oriented to person, place, and time. She has normal strength. She displays  abnormal reflex. She displays no atrophy and no tremor. No cranial nerve deficit or sensory deficit. She exhibits normal muscle tone. She displays a negative Romberg sign. She displays no seizure activity. Coordination and gait normal. She displays no Babinski's sign on the right side. She displays no Babinski's sign on the left side.  Reflex Scores:      Tricep reflexes are 1+ on the right side and 1+ on the left side.      Bicep reflexes are 1+ on the right side and 1+ on the left side.      Brachioradialis reflexes are 1+ on the right side and 1+ on the left side.      Patellar reflexes are 0 on the right side and 0 on the left side.      Achilles reflexes are 0 on the right side and 0 on the left side. + SLR in RLE - SLR in LLE  Skin: Skin is warm and dry. No rash noted. She is not diaphoretic. No erythema. No pallor.  Psychiatric: She has a normal mood and affect. Her behavior is normal. Judgment and thought content normal.     Lab Results  Component Value Date   WBC 4.4* 10/28/2012   HGB 12.1 10/28/2012   HCT 37.3 10/28/2012   PLT 278.0 10/28/2012   GLUCOSE 98 10/28/2012   CHOL 138 10/28/2012   TRIG 66.0 10/28/2012   HDL 58.10 10/28/2012   LDLCALC 67 10/28/2012   ALT 16 10/28/2012   AST 15 10/28/2012   NA 139 10/28/2012   K 3.5 10/28/2012   CL 103 10/28/2012   CREATININE 0.8 10/28/2012   BUN 18 10/28/2012   CO2 29 10/28/2012   TSH 2.35 10/28/2012   HGBA1C 6.8* 10/28/2012       Assessment & Plan:

## 2013-12-22 NOTE — Assessment & Plan Note (Signed)
Her blood sugars are well controlled 

## 2013-12-22 NOTE — Assessment & Plan Note (Signed)
Her H/H are stable Her iron levels are normal

## 2013-12-22 NOTE — Assessment & Plan Note (Signed)
Her BP is well controlled Lytes and renal function are stable 

## 2013-12-22 NOTE — Assessment & Plan Note (Signed)
She has worsening s/s Will get an MRI done She does not want to see pain mngt or to consider surgery unless the MRI shows significant findings and it appears that surgery would reduce her pain

## 2013-12-22 NOTE — Assessment & Plan Note (Signed)
Will cont current meds Will get an updated MRI done to see how severe this is

## 2013-12-22 NOTE — Assessment & Plan Note (Addendum)

## 2013-12-22 NOTE — Assessment & Plan Note (Signed)
Will start zantac for this

## 2013-12-22 NOTE — Assessment & Plan Note (Signed)
She has achieved her LDL goal 

## 2014-01-12 ENCOUNTER — Encounter: Payer: Self-pay | Admitting: Gastroenterology

## 2014-08-22 ENCOUNTER — Other Ambulatory Visit: Payer: Self-pay | Admitting: Internal Medicine

## 2014-08-22 DIAGNOSIS — Z1231 Encounter for screening mammogram for malignant neoplasm of breast: Secondary | ICD-10-CM

## 2014-09-24 ENCOUNTER — Other Ambulatory Visit: Payer: Self-pay

## 2014-09-25 ENCOUNTER — Ambulatory Visit (HOSPITAL_COMMUNITY): Payer: Medicare Other

## 2014-10-11 ENCOUNTER — Ambulatory Visit (HOSPITAL_COMMUNITY): Payer: Medicare Other

## 2014-10-18 ENCOUNTER — Ambulatory Visit (HOSPITAL_COMMUNITY)
Admission: RE | Admit: 2014-10-18 | Discharge: 2014-10-18 | Disposition: A | Discharge status: Home / Self Care | Payer: Medicare Other | Source: Ambulatory Visit | Attending: Internal Medicine | Admitting: Internal Medicine

## 2014-10-18 DIAGNOSIS — Z1231 Encounter for screening mammogram for malignant neoplasm of breast: Secondary | ICD-10-CM

## 2014-10-18 LAB — HM MAMMOGRAPHY: HM Mammogram: NORMAL

## 2015-01-03 ENCOUNTER — Encounter: Payer: Medicare Other | Admitting: Internal Medicine

## 2015-01-09 ENCOUNTER — Ambulatory Visit (INDEPENDENT_AMBULATORY_CARE_PROVIDER_SITE_OTHER): Payer: Medicare Other | Admitting: Internal Medicine

## 2015-01-09 ENCOUNTER — Other Ambulatory Visit (INDEPENDENT_AMBULATORY_CARE_PROVIDER_SITE_OTHER): Payer: Medicare Other

## 2015-01-09 ENCOUNTER — Encounter: Payer: Self-pay | Admitting: Internal Medicine

## 2015-01-09 VITALS — BP 128/70 | HR 77 | Temp 98.1°F | Resp 16 | Ht 66.0 in | Wt 183.0 lb

## 2015-01-09 DIAGNOSIS — M5136 Other intervertebral disc degeneration, lumbar region: Secondary | ICD-10-CM

## 2015-01-09 DIAGNOSIS — K2100 Gastro-esophageal reflux disease with esophagitis, without bleeding: Secondary | ICD-10-CM

## 2015-01-09 DIAGNOSIS — E118 Type 2 diabetes mellitus with unspecified complications: Secondary | ICD-10-CM

## 2015-01-09 DIAGNOSIS — E559 Vitamin D deficiency, unspecified: Secondary | ICD-10-CM | POA: Diagnosis not present

## 2015-01-09 DIAGNOSIS — D509 Iron deficiency anemia, unspecified: Secondary | ICD-10-CM | POA: Diagnosis not present

## 2015-01-09 DIAGNOSIS — I10 Essential (primary) hypertension: Secondary | ICD-10-CM | POA: Diagnosis not present

## 2015-01-09 DIAGNOSIS — E785 Hyperlipidemia, unspecified: Secondary | ICD-10-CM

## 2015-01-09 DIAGNOSIS — M51369 Other intervertebral disc degeneration, lumbar region without mention of lumbar back pain or lower extremity pain: Secondary | ICD-10-CM

## 2015-01-09 DIAGNOSIS — Z23 Encounter for immunization: Secondary | ICD-10-CM | POA: Diagnosis not present

## 2015-01-09 DIAGNOSIS — H409 Unspecified glaucoma: Secondary | ICD-10-CM

## 2015-01-09 LAB — URINALYSIS, ROUTINE W REFLEX MICROSCOPIC
BILIRUBIN URINE: NEGATIVE
Ketones, ur: NEGATIVE
LEUKOCYTES UA: NEGATIVE
NITRITE: NEGATIVE
PH: 5.5 (ref 5.0–8.0)
Specific Gravity, Urine: >=1.03 — AB (ref 1.000–1.030)
TOTAL PROTEIN, URINE-UPE24: NEGATIVE
Urine Glucose: NEGATIVE
Urobilinogen, UA: 0.2 (ref 0.0–1.0)

## 2015-01-09 LAB — HEMOGLOBIN A1C: HEMOGLOBIN A1C: 6.3 % (ref 4.6–6.5)

## 2015-01-09 LAB — CBC WITH DIFFERENTIAL/PLATELET
BASOS PCT: 0.3 % (ref 0.0–3.0)
Basophils Absolute: 0 10*3/uL (ref 0.0–0.1)
EOS ABS: 0.2 10*3/uL (ref 0.0–0.7)
Eosinophils Relative: 4.3 % (ref 0.0–5.0)
HCT: 35.2 % — ABNORMAL LOW (ref 36.0–46.0)
HEMOGLOBIN: 11.4 g/dL — AB (ref 12.0–15.0)
LYMPHS PCT: 38.3 % (ref 12.0–46.0)
Lymphs Abs: 1.5 10*3/uL (ref 0.7–4.0)
MCHC: 32.3 g/dL (ref 30.0–36.0)
MCV: 85.7 fl (ref 78.0–100.0)
MONO ABS: 0.3 10*3/uL (ref 0.1–1.0)
Monocytes Relative: 8.1 % (ref 3.0–12.0)
Neutro Abs: 1.9 10*3/uL (ref 1.4–7.7)
Neutrophils Relative %: 49 % (ref 43.0–77.0)
Platelets: 271 10*3/uL (ref 150.0–400.0)
RBC: 4.11 Mil/uL (ref 3.87–5.11)
RDW: 15.3 % (ref 11.5–15.5)
WBC: 3.8 10*3/uL — AB (ref 4.0–10.5)

## 2015-01-09 LAB — COMPREHENSIVE METABOLIC PANEL
ALBUMIN: 3.9 g/dL (ref 3.5–5.2)
ALT: 10 U/L (ref 0–35)
AST: 12 U/L (ref 0–37)
Alkaline Phosphatase: 49 U/L (ref 39–117)
BILIRUBIN TOTAL: 0.6 mg/dL (ref 0.2–1.2)
BUN: 20 mg/dL (ref 6–23)
CHLORIDE: 104 meq/L (ref 96–112)
CO2: 31 meq/L (ref 19–32)
CREATININE: 0.86 mg/dL (ref 0.40–1.20)
Calcium: 9.3 mg/dL (ref 8.4–10.5)
GFR: 83.26 mL/min (ref >60.00)
Glucose, Bld: 89 mg/dL (ref 70–99)
Potassium: 3.4 mEq/L — ABNORMAL LOW (ref 3.5–5.1)
SODIUM: 141 meq/L (ref 135–145)
Total Protein: 7.3 g/dL (ref 6.0–8.3)

## 2015-01-09 LAB — IBC PANEL
IRON: 82 ug/dL (ref 42–145)
SATURATION RATIOS: 23.9 % (ref 20.0–50.0)
TRANSFERRIN: 245 mg/dL (ref 212.0–360.0)

## 2015-01-09 LAB — LIPID PANEL
CHOLESTEROL: 133 mg/dL (ref 0–200)
HDL: 52 mg/dL (ref >39.00)
LDL Cholesterol: 69 mg/dL (ref 0–99)
NONHDL: 80.99
Total CHOL/HDL Ratio: 3
Triglycerides: 62 mg/dL (ref 0.0–149.0)
VLDL: 12.4 mg/dL (ref 0.0–40.0)

## 2015-01-09 LAB — TSH: TSH: 1.21 u[IU]/mL (ref 0.35–4.50)

## 2015-01-09 LAB — MICROALBUMIN / CREATININE URINE RATIO
Creatinine,U: 212.5 mg/dL
Microalb Creat Ratio: 0.3 mg/g (ref 0.0–30.0)
Microalb, Ur: <0.7 mg/dL (ref 0.0–1.9)

## 2015-01-09 LAB — VITAMIN D 25 HYDROXY (VIT D DEFICIENCY, FRACTURES): VITD: 34.45 ng/mL (ref 30.00–100.00)

## 2015-01-09 MED ORDER — ASPIRIN 81 MG PO TABS: 81.0000 mg | ORAL_TABLET | Freq: Every day | ORAL | Status: DC

## 2015-01-09 MED ORDER — RANITIDINE HCL 300 MG PO TABS: 300.0000 mg | ORAL_TABLET | Freq: Every day | ORAL | Status: DC

## 2015-01-09 MED ORDER — CHOLECALCIFEROL 1.25 MG (50000 UT) PO TABS: 1.0000 | ORAL_TABLET | ORAL | Status: DC

## 2015-01-09 MED ORDER — CYCLOBENZAPRINE HCL 10 MG PO TABS: 10.0000 mg | ORAL_TABLET | Freq: Two times a day (BID) | ORAL | Status: DC | PRN

## 2015-01-09 MED ORDER — ATORVASTATIN CALCIUM 20 MG PO TABS: 20.0000 mg | ORAL_TABLET | Freq: Every day | ORAL | Status: DC

## 2015-01-09 MED ORDER — GLUCOSAMINE-CHONDROITIN 500-400 MG PO TABS: 1.0000 | ORAL_TABLET | Freq: Every day | ORAL | Status: DC

## 2015-01-09 MED ORDER — VALSARTAN-HYDROCHLOROTHIAZIDE 160-25 MG PO TABS: 1.0000 | ORAL_TABLET | Freq: Every day | ORAL | Status: DC

## 2015-01-09 MED ORDER — IBUPROFEN 400 MG PO TABS: 400.0000 mg | ORAL_TABLET | Freq: Three times a day (TID) | ORAL | Status: DC | PRN

## 2015-01-09 NOTE — Progress Notes (Signed)
Pre visit review using our clinic review tool, if applicable. No additional management support is needed unless otherwise documented below in the visit note. 

## 2015-01-09 NOTE — Patient Instructions (Signed)

## 2015-01-09 NOTE — Progress Notes (Signed)
Subjective:  Patient ID: Alexandria [name], female    DOB: [birth year]  Age: 74 y.o. MRN: [id number]  CC: Hypertension; Hyperlipidemia; and Diabetes   HPI Alexandria [name] presents for follow up. She has stopped taking metoprolol and her BP has been well controlled.  She has chronic LBP and knee pain and is getting pain relief with motrin.  Outpatient Prescriptions Prior to Visit  Medication Sig Dispense Refill  . fish oil-omega-3 fatty acids 1000 MG capsule Take 1 capsule (1 g total) by mouth daily. 90 capsule 3  . latanoprost (XALATAN) 0.005 % ophthalmic solution Place 1 drop into both eyes as directed. 2.5 mL 5  . Multiple Vitamins-Minerals (MULTIVITAMIN,TX-MINERALS) tablet Take 1 tablet by mouth daily.      Marland Kitchen OVER THE COUNTER MEDICATION CALCIUM     . OVER THE COUNTER MEDICATION VIT D     . atorvastatin (LIPITOR) 20 MG tablet Take 1 tablet (20 mg total) by mouth daily. 90 tablet 3  . Cholecalciferol 50000 UNITS TABS Take 1 tablet by mouth once a week. 12 tablet 3  . cyclobenzaprine (FLEXERIL) 10 MG tablet Take 1 tablet (10 mg total) by mouth 2 (two) times daily as needed. 180 tablet 3  . glucosamine-chondroitin 500-400 MG tablet Take 1 tablet by mouth daily. 90 tablet 3  . HYDROcodone-acetaminophen (NORCO) 7.5-325 MG per tablet Take 1-2 tablets by mouth every 8 (eight) hours as needed. 50 tablet 5  . ibuprofen (ADVIL,MOTRIN) 400 MG tablet Take 1 tablet (400 mg total) by mouth every 8 (eight) hours as needed. 30 tablet 5  . MOVIPREP 100 G SOLR Take 1 kit (100 g total) by mouth once. 1 kit 0  . ranitidine (ZANTAC) 300 MG tablet Take 1 tablet (300 mg total) by mouth at bedtime. 90 tablet 3  . valsartan-hydrochlorothiazide (DIOVAN-HCT) 160-25 MG per tablet Take 1 tablet by mouth daily. 90 tablet 3  . metoprolol succinate (TOPROL-XL) 50 MG 24 hr tablet Take 1 tablet (50 mg total) by mouth daily. 90 tablet 3   No facility-administered medications prior to visit.    ROS Review of Systems    Constitutional: Negative.  Negative for fever, chills, diaphoresis, appetite change and fatigue.  HENT: Negative.   Eyes: Negative.   Respiratory: Negative.  Negative for cough, choking, chest tightness, shortness of breath and stridor.   Cardiovascular: Negative.  Negative for chest pain, palpitations and leg swelling.  Gastrointestinal: Negative.  Negative for nausea, vomiting, abdominal pain, diarrhea, constipation and blood in stool.  Endocrine: Negative.  Negative for polydipsia, polyphagia and polyuria.  Genitourinary: Negative.   Musculoskeletal: Positive for back pain and arthralgias. Negative for myalgias and joint swelling.  Skin: Negative.  Negative for rash.  Allergic/Immunologic: Negative.   Neurological: Negative.   Hematological: Negative.  Negative for adenopathy. Does not bruise/bleed easily.  Psychiatric/Behavioral: Negative.     Objective:  BP 128/70 mmHg  Pulse 77  Temp(Src) 98.1 F (36.7 C) (Oral)  Ht _0  (1.676 m)  Wt 183 lb (83.008 kg)  BMI 29.55 kg/m2  SpO2 98%  BP Readings from Last 3 Encounters:  01/09/15 128/70  12/21/13 130/78  10/28/12 108/74    Wt Readings from Last 3 Encounters:  01/09/15 183 lb (83.008 kg)  10/28/12 198 lb (89.812 kg)  11/24/11 197 lb (89.359 kg)    Physical Exam  Constitutional: She is oriented to person, place, and time. She appears well-developed and well-nourished. No distress.  HENT:  Mouth/Throat: Oropharynx is clear and moist.  No oropharyngeal exudate.  Eyes: Conjunctivae are normal. Right eye exhibits no discharge. Left eye exhibits no discharge. No scleral icterus.  Neck: Normal range of motion. Neck supple. No JVD present. No tracheal deviation present. No thyromegaly present.  Cardiovascular: Normal rate, regular rhythm, normal heart sounds and intact distal pulses.  Exam reveals no gallop and no friction rub.   No murmur heard. Pulmonary/Chest: Effort normal and breath sounds normal. No stridor. No  respiratory distress. She has no wheezes. She has no rales. She exhibits no tenderness.  Abdominal: Soft. Bowel sounds are normal. She exhibits no distension and no mass. There is no tenderness. There is no rebound and no guarding.  Musculoskeletal: Normal range of motion. She exhibits no edema or tenderness.  Lymphadenopathy:    She has no cervical adenopathy.  Neurological: She is oriented to person, place, and time.  Skin: Skin is warm and dry. No rash noted. She is not diaphoretic. No erythema. No pallor.  Vitals reviewed.   Lab Results  Component Value Date   WBC 3.8* 01/09/2015   HGB 11.4* 01/09/2015   HCT 35.2* 01/09/2015   PLT 271.0 01/09/2015   GLUCOSE 89 01/09/2015   CHOL 133 01/09/2015   TRIG 62.0 01/09/2015   HDL 52.00 01/09/2015   LDLCALC 69 01/09/2015   ALT 10 01/09/2015   AST 12 01/09/2015   NA 141 01/09/2015   K 3.4* 01/09/2015   CL 104 01/09/2015   CREATININE 0.86 01/09/2015   BUN 20 01/09/2015   CO2 31 01/09/2015   TSH 1.21 01/09/2015   HGBA1C 6.3 01/09/2015   MICROALBUR <0.7 01/09/2015    Mm Screening Breast Tomo Bilateral  10/18/2014  CLINICAL DATA:  Screening. EXAM: DIGITAL SCREENING BILATERAL MAMMOGRAM WITH 3D TOMO WITH CAD COMPARISON:  Previous exam(s). ACR Breast Density Category d: The breast tissue is extremely dense, which lowers the sensitivity of mammography. FINDINGS: There are no findings suspicious for malignancy. Images were processed with CAD. IMPRESSION: No mammographic evidence of malignancy. A result letter of this screening mammogram will be mailed directly to the patient. RECOMMENDATION: Screening mammogram in one year. (Code:SM-B-01Y) BI-RADS CATEGORY  1: Negative. Electronically Signed   By: Conchita Paris M.D.   On: 10/18/2014 14:55    Assessment & Plan:   Alexandria [name] was seen today for hypertension, hyperlipidemia and diabetes.  Diagnoses and all orders for this visit:  Essential hypertension- her BP is well controlled, K+ level is  slightly low so will start K= replacement therapy -     Comprehensive metabolic panel; Future -     Urinalysis, Routine w reflex microscopic (not at [facility]); Future -     valsartan-hydrochlorothiazide (DIOVAN-HCT) 160-25 MG tablet; Take 1 tablet by mouth daily.  Type 2 diabetes mellitus with complication, without long-term current use of insulin ([place])- her blood sugars are well controlled, she has made progress with her lifestyle modifications -     Lipid panel; Future -     Comprehensive metabolic panel; Future -     Hemoglobin A1c; Future -     Microalbumin / creatinine urine ratio; Future -     Ambulatory referral to Ophthalmology -     aspirin 81 MG tablet; Take 1 tablet (81 mg total) by mouth daily.  ANEMIA-IRON DEFICIENCY- iron level is normal but she is still slightly anemic, she also has a component of anemia of chronic disease -     IBC panel; Future -     CBC with Differential/Platelet; Future  Hyperlipidemia with  target LDL less than 100- she has achieved her LDL goal -     Lipid panel; Future -     Comprehensive metabolic panel; Future -     TSH; Future  Vitamin D deficiency -     Vit D  25 hydroxy (rtn osteoporosis monitoring); Future -     Cholecalciferol 50000 UNITS TABS; Take 1 tablet by mouth once a week.  Hyperlipidemia -     atorvastatin (LIPITOR) 20 MG tablet; Take 1 tablet (20 mg total) by mouth daily.  Gastroesophageal reflux disease with esophagitis -     ranitidine (ZANTAC) 300 MG tablet; Take 1 tablet (300 mg total) by mouth at bedtime.  Degenerative disc disease, lumbar -     cyclobenzaprine (FLEXERIL) 10 MG tablet; Take 1 tablet (10 mg total) by mouth 2 (two) times daily as needed. -     glucosamine-chondroitin 500-400 MG tablet; Take 1 tablet by mouth daily. -     ibuprofen (ADVIL,MOTRIN) 400 MG tablet; Take 1 tablet (400 mg total) by mouth every 8 (eight) hours as needed.  GLAUCOMA, RIGHT EYE -     Ambulatory referral to Ophthalmology  Need for  influenza vaccination -     Flu Vaccine QUAD 36+ mos IM  Other orders -     Cancel: metoprolol succinate (TOPROL-XL) 50 MG 24 hr tablet; Take 1 tablet (50 mg total) by mouth daily.   I have discontinued Ms. Fritzler's MOVIPREP, HYDROcodone-acetaminophen, and metoprolol succinate. I have also changed her valsartan-hydrochlorothiazide. Additionally, I am having her start on aspirin. Lastly, I am having her maintain her OVER THE COUNTER MEDICATION, (multivitamin,tx-minerals), OVER THE COUNTER MEDICATION, fish oil-omega-3 fatty acids, latanoprost, atorvastatin, Cholecalciferol, cyclobenzaprine, glucosamine-chondroitin, ibuprofen, and ranitidine.  Meds ordered this encounter  Medications  . atorvastatin (LIPITOR) 20 MG tablet    Sig: Take 1 tablet (20 mg total) by mouth daily.    Dispense:  90 tablet    Refill:  3  . Cholecalciferol 50000 UNITS TABS    Sig: Take 1 tablet by mouth once a week.    Dispense:  12 tablet    Refill:  3  . cyclobenzaprine (FLEXERIL) 10 MG tablet    Sig: Take 1 tablet (10 mg total) by mouth 2 (two) times daily as needed.    Dispense:  180 tablet    Refill:  3  . glucosamine-chondroitin 500-400 MG tablet    Sig: Take 1 tablet by mouth daily.    Dispense:  90 tablet    Refill:  3  . ibuprofen (ADVIL,MOTRIN) 400 MG tablet    Sig: Take 1 tablet (400 mg total) by mouth every 8 (eight) hours as needed.    Dispense:  30 tablet    Refill:  5  . ranitidine (ZANTAC) 300 MG tablet    Sig: Take 1 tablet (300 mg total) by mouth at bedtime.    Dispense:  90 tablet    Refill:  3  . valsartan-hydrochlorothiazide (DIOVAN-HCT) 160-25 MG tablet    Sig: Take 1 tablet by mouth daily.    Dispense:  90 tablet    Refill:  3  . aspirin 81 MG tablet    Sig: Take 1 tablet (81 mg total) by mouth daily.    Dispense:  30 tablet    Refill:  11     Follow-up: Return in about 4 months (around 05/12/2015).  Scarlette Calico, MD

## 2015-01-10 ENCOUNTER — Encounter: Payer: Self-pay | Admitting: Internal Medicine

## 2015-01-10 MED ORDER — POTASSIUM CHLORIDE CRYS ER 20 MEQ PO TBCR: 20.0000 meq | EXTENDED_RELEASE_TABLET | Freq: Two times a day (BID) | ORAL | Status: DC

## 2015-01-10 NOTE — Addendum Note (Signed)
Addended by: Janith Lima on: 01/10/2015 12:55 PM   Modules accepted: Orders

## 2015-03-06 DIAGNOSIS — H401131 Primary open-angle glaucoma, bilateral, mild stage: Secondary | ICD-10-CM | POA: Diagnosis not present

## 2015-03-06 DIAGNOSIS — H43813 Vitreous degeneration, bilateral: Secondary | ICD-10-CM | POA: Diagnosis not present

## 2015-06-11 DIAGNOSIS — H04123 Dry eye syndrome of bilateral lacrimal glands: Secondary | ICD-10-CM | POA: Diagnosis not present

## 2015-06-11 DIAGNOSIS — H401131 Primary open-angle glaucoma, bilateral, mild stage: Secondary | ICD-10-CM | POA: Diagnosis not present

## 2015-06-11 DIAGNOSIS — H2513 Age-related nuclear cataract, bilateral: Secondary | ICD-10-CM | POA: Diagnosis not present

## 2015-10-07 ENCOUNTER — Other Ambulatory Visit: Payer: Self-pay | Admitting: Internal Medicine

## 2015-10-07 DIAGNOSIS — Z1231 Encounter for screening mammogram for malignant neoplasm of breast: Secondary | ICD-10-CM

## 2015-10-07 DIAGNOSIS — Z853 Personal history of malignant neoplasm of breast: Secondary | ICD-10-CM

## 2015-10-21 ENCOUNTER — Ambulatory Visit
Admission: RE | Admit: 2015-10-21 | Discharge: 2015-10-21 | Disposition: A | Discharge status: Home / Self Care | Payer: Medicare Other | Source: Ambulatory Visit | Attending: Internal Medicine | Admitting: Internal Medicine

## 2015-10-21 DIAGNOSIS — Z853 Personal history of malignant neoplasm of breast: Secondary | ICD-10-CM

## 2015-10-21 DIAGNOSIS — Z1231 Encounter for screening mammogram for malignant neoplasm of breast: Secondary | ICD-10-CM

## 2015-10-21 LAB — HM MAMMOGRAPHY

## 2016-01-09 ENCOUNTER — Encounter: Payer: Self-pay | Admitting: Internal Medicine

## 2016-01-09 ENCOUNTER — Encounter: Payer: Medicare Other | Admitting: Internal Medicine

## 2016-01-09 ENCOUNTER — Ambulatory Visit (INDEPENDENT_AMBULATORY_CARE_PROVIDER_SITE_OTHER): Payer: Medicare Other | Admitting: Internal Medicine

## 2016-01-09 ENCOUNTER — Other Ambulatory Visit (INDEPENDENT_AMBULATORY_CARE_PROVIDER_SITE_OTHER): Payer: Medicare Other

## 2016-01-09 VITALS — BP 118/64 | HR 75 | Temp 97.9°F | Resp 16 | Ht 66.0 in | Wt 190.5 lb

## 2016-01-09 DIAGNOSIS — Z23 Encounter for immunization: Secondary | ICD-10-CM

## 2016-01-09 DIAGNOSIS — E118 Type 2 diabetes mellitus with unspecified complications: Secondary | ICD-10-CM | POA: Diagnosis not present

## 2016-01-09 DIAGNOSIS — D508 Other iron deficiency anemias: Secondary | ICD-10-CM | POA: Diagnosis not present

## 2016-01-09 DIAGNOSIS — D518 Other vitamin B12 deficiency anemias: Secondary | ICD-10-CM

## 2016-01-09 DIAGNOSIS — E785 Hyperlipidemia, unspecified: Secondary | ICD-10-CM

## 2016-01-09 DIAGNOSIS — M4710 Other spondylosis with myelopathy, site unspecified: Secondary | ICD-10-CM

## 2016-01-09 DIAGNOSIS — E669 Obesity, unspecified: Secondary | ICD-10-CM

## 2016-01-09 DIAGNOSIS — M150 Primary generalized (osteo)arthritis: Secondary | ICD-10-CM

## 2016-01-09 DIAGNOSIS — M159 Polyosteoarthritis, unspecified: Secondary | ICD-10-CM | POA: Insufficient documentation

## 2016-01-09 DIAGNOSIS — H4010X1 Unspecified open-angle glaucoma, mild stage: Secondary | ICD-10-CM

## 2016-01-09 DIAGNOSIS — I10 Essential (primary) hypertension: Secondary | ICD-10-CM

## 2016-01-09 DIAGNOSIS — D649 Anemia, unspecified: Secondary | ICD-10-CM | POA: Insufficient documentation

## 2016-01-09 DIAGNOSIS — K2100 Gastro-esophageal reflux disease with esophagitis, without bleeding: Secondary | ICD-10-CM

## 2016-01-09 DIAGNOSIS — E876 Hypokalemia: Secondary | ICD-10-CM

## 2016-01-09 DIAGNOSIS — Z0000 Encounter for general adult medical examination without abnormal findings: Secondary | ICD-10-CM

## 2016-01-09 DIAGNOSIS — M5136 Other intervertebral disc degeneration, lumbar region: Secondary | ICD-10-CM

## 2016-01-09 LAB — CBC WITH DIFFERENTIAL/PLATELET
Basophils Absolute: 0 10*3/uL (ref 0.0–0.1)
Basophils Relative: 0.5 % (ref 0.0–3.0)
EOS PCT: 4.2 % (ref 0.0–5.0)
Eosinophils Absolute: 0.2 10*3/uL (ref 0.0–0.7)
HEMATOCRIT: 34.7 % — AB (ref 36.0–46.0)
HEMOGLOBIN: 11.5 g/dL — AB (ref 12.0–15.0)
LYMPHS PCT: 38.8 % (ref 12.0–46.0)
Lymphs Abs: 1.4 10*3/uL (ref 0.7–4.0)
MCHC: 33.2 g/dL (ref 30.0–36.0)
MCV: 85.4 fl (ref 78.0–100.0)
MONOS PCT: 8.6 % (ref 3.0–12.0)
Monocytes Absolute: 0.3 10*3/uL (ref 0.1–1.0)
Neutro Abs: 1.8 10*3/uL (ref 1.4–7.7)
Neutrophils Relative %: 47.9 % (ref 43.0–77.0)
Platelets: 259 10*3/uL (ref 150.0–400.0)
RBC: 4.06 Mil/uL (ref 3.87–5.11)
RDW: 15.5 % (ref 11.5–15.5)
WBC: 3.7 10*3/uL — AB (ref 4.0–10.5)

## 2016-01-09 LAB — FERRITIN: Ferritin: 112.4 ng/mL (ref 10.0–291.0)

## 2016-01-09 LAB — URINALYSIS, ROUTINE W REFLEX MICROSCOPIC
BILIRUBIN URINE: NEGATIVE
HGB URINE DIPSTICK: NEGATIVE
KETONES UR: NEGATIVE
NITRITE: NEGATIVE
PH: 6 (ref 5.0–8.0)
RBC / HPF: NONE SEEN (ref 0–?)
Specific Gravity, Urine: 1.02 (ref 1.000–1.030)
TOTAL PROTEIN, URINE-UPE24: NEGATIVE
Urine Glucose: NEGATIVE
Urobilinogen, UA: 2 — AB (ref 0.0–1.0)

## 2016-01-09 LAB — IBC PANEL
Iron: 48 ug/dL (ref 42–145)
SATURATION RATIOS: 14.3 % — AB (ref 20.0–50.0)
TRANSFERRIN: 240 mg/dL (ref 212.0–360.0)

## 2016-01-09 LAB — LIPID PANEL
Cholesterol: 149 mg/dL (ref 0–200)
HDL: 60.1 mg/dL (ref >39.00)
LDL Cholesterol: 75 mg/dL (ref 0–99)
NONHDL: 89.01
Total CHOL/HDL Ratio: 2
Triglycerides: 68 mg/dL (ref 0.0–149.0)
VLDL: 13.6 mg/dL (ref 0.0–40.0)

## 2016-01-09 LAB — COMPREHENSIVE METABOLIC PANEL
ALBUMIN: 3.9 g/dL (ref 3.5–5.2)
ALK PHOS: 47 U/L (ref 39–117)
ALT: 12 U/L (ref 0–35)
AST: 14 U/L (ref 0–37)
BILIRUBIN TOTAL: 0.6 mg/dL (ref 0.2–1.2)
BUN: 20 mg/dL (ref 6–23)
CO2: 31 mEq/L (ref 19–32)
Calcium: 8.9 mg/dL (ref 8.4–10.5)
Chloride: 104 mEq/L (ref 96–112)
Creatinine, Ser: 0.89 mg/dL (ref 0.40–1.20)
GFR: 79.81 mL/min (ref >60.00)
Glucose, Bld: 98 mg/dL (ref 70–99)
POTASSIUM: 3.4 meq/L — AB (ref 3.5–5.1)
Sodium: 141 mEq/L (ref 135–145)
TOTAL PROTEIN: 7.4 g/dL (ref 6.0–8.3)

## 2016-01-09 LAB — VITAMIN B12: VITAMIN B 12: 275 pg/mL (ref 211–911)

## 2016-01-09 LAB — MICROALBUMIN / CREATININE URINE RATIO
CREATININE, U: 179.7 mg/dL
MICROALB UR: 0.8 mg/dL (ref 0.0–1.9)
MICROALB/CREAT RATIO: 0.4 mg/g (ref 0.0–30.0)

## 2016-01-09 LAB — TSH: TSH: 1.92 u[IU]/mL (ref 0.35–4.50)

## 2016-01-09 LAB — HEMOGLOBIN A1C: Hgb A1c MFr Bld: 6.3 % (ref 4.6–6.5)

## 2016-01-09 LAB — FOLATE: Folate: 19.6 ng/mL (ref >5.9)

## 2016-01-09 MED ORDER — CYANOCOBALAMIN 2000 MCG PO TABS: 2000.0000 ug | ORAL_TABLET | Freq: Every day | ORAL | 3 refills | Status: DC

## 2016-01-09 MED ORDER — ESOMEPRAZOLE MAGNESIUM 40 MG PO CPDR: 40.0000 mg | DELAYED_RELEASE_CAPSULE | Freq: Every day | ORAL | 3 refills | Status: DC

## 2016-01-09 MED ORDER — POTASSIUM CHLORIDE CRYS ER 20 MEQ PO TBCR: 20.0000 meq | EXTENDED_RELEASE_TABLET | Freq: Two times a day (BID) | ORAL | 5 refills | Status: DC

## 2016-01-09 MED ORDER — MELOXICAM 15 MG PO TABS: 15.0000 mg | ORAL_TABLET | Freq: Every day | ORAL | 1 refills | Status: DC

## 2016-01-09 NOTE — Progress Notes (Signed)
Subjective:  Patient ID: Alexandria [name], female    DOB: 20-Sep-1940  Age: 75 y.o. MRN: [id number]  CC: Annual Exam; Anemia; Hypertension; Diabetes; Osteoarthritis; and Gastroesophageal Reflux   HPI Alexandria [name] presents for AWV/CPX.  She complains of worsening heartburn. She has tried over-the-counter Zantac, Mylanta, and Pepto-Bismol but still has episodes of heartburn. She denies odynophagia, dysphagia, loss of appetite, or weight loss. She complains of mild weight gain.  She also complains of ongoing symptoms with osteoarthritis and low back pain. Her pain is in both knees. She has tried symptom relief with ibuprofen but it has not helped much. She also occasionally takes Flexeril for the back pain and says it does help. The back pain does not radiate into her lower extremities and she denies paresthesias.   Past Medical History:  Diagnosis Date  . Anemia, iron deficiency   . Chest pain, unspecified   . Colon polyp 09/2006   adenomatous  . Fibrocystic breast disease   . Glaucoma, right eye   . Hyperlipidemia   . Hypertension   . LBP (low back pain)   . Osteoarthritis   . Osteopenia    Past Surgical History:  Procedure Laterality Date  . ABDOMINAL HYSTERECTOMY    . BREAST LUMPECTOMY     RIGHT 22 years ago  . CHOLECYSTECTOMY      reports that she has never smoked. She has never used smokeless tobacco. She reports that she does not drink alcohol or use drugs. family history includes Arthritis in her other; Hypertension in her other. Allergies  Allergen Reactions  . Oxycodone-Acetaminophen     REACTION: nausea and vomiting  . Tramadol Hcl     REACTION: nausea and vomiting    Outpatient Medications Prior to Visit  Medication Sig Dispense Refill  . aspirin 81 MG tablet Take 1 tablet (81 mg total) by mouth daily. 30 tablet 11  . atorvastatin (LIPITOR) 20 MG tablet Take 1 tablet (20 mg total) by mouth daily. 90 tablet 3  . Cholecalciferol 50000 UNITS TABS Take 1 tablet by  mouth once a week. 12 tablet 3  . cyclobenzaprine (FLEXERIL) 10 MG tablet Take 1 tablet (10 mg total) by mouth 2 (two) times daily as needed. 180 tablet 3  . fish oil-omega-3 fatty acids 1000 MG capsule Take 1 capsule (1 g total) by mouth daily. 90 capsule 3  . glucosamine-chondroitin 500-400 MG tablet Take 1 tablet by mouth daily. 90 tablet 3  . latanoprost (XALATAN) 0.005 % ophthalmic solution Place 1 drop into both eyes as directed. 2.5 mL 5  . Multiple Vitamins-Minerals (MULTIVITAMIN,TX-MINERALS) tablet Take 1 tablet by mouth daily.      . valsartan-hydrochlorothiazide (DIOVAN-HCT) 160-25 MG tablet Take 1 tablet by mouth daily. 90 tablet 3  . ibuprofen (ADVIL,MOTRIN) 400 MG tablet Take 1 tablet (400 mg total) by mouth every 8 (eight) hours as needed. 30 tablet 5  . OVER THE COUNTER MEDICATION CALCIUM     . OVER THE COUNTER MEDICATION VIT D     . potassium chloride SA (K-DUR,KLOR-CON) 20 MEQ tablet Take 1 tablet (20 mEq total) by mouth 2 (two) times daily. 60 tablet 5  . ranitidine (ZANTAC) 300 MG tablet Take 1 tablet (300 mg total) by mouth at bedtime. 90 tablet 3   No facility-administered medications prior to visit.     ROS Review of Systems  Constitutional: Positive for unexpected weight change. Negative for appetite change, chills, fatigue and fever.  HENT: Negative.  Negative for  sore throat, trouble swallowing and voice change.   Eyes: Negative.   Respiratory: Negative.  Negative for cough, choking, chest tightness, shortness of breath and stridor.   Gastrointestinal: Negative.  Negative for abdominal pain, blood in stool, constipation, diarrhea and nausea.  Endocrine: Negative.   Genitourinary: Negative.  Negative for decreased urine volume, difficulty urinating and urgency.  Musculoskeletal: Positive for arthralgias and back pain. Negative for joint swelling, myalgias and neck pain.  Skin: Negative.  Negative for color change and rash.  Allergic/Immunologic: Negative.     Neurological: Negative.  Negative for dizziness, tremors, syncope, numbness and headaches.  Hematological: Negative.  Negative for adenopathy. Does not bruise/bleed easily.  Psychiatric/Behavioral: Negative.     Objective:  BP 118/64 (BP Location: Left Arm, Patient Position: Sitting, Cuff Size: Normal)   Pulse 75   Temp 97.9 F (36.6 C) (Oral)   Resp 16   Ht 5\' 6"  (1.676 m)   Wt 190 lb 8 oz (86.4 kg)   SpO2 97%   BMI 30.75 kg/m   BP Readings from Last 3 Encounters:  01/09/16 118/64  01/09/15 128/70  12/21/13 130/78    Wt Readings from Last 3 Encounters:  01/09/16 190 lb 8 oz (86.4 kg)  01/09/15 183 lb (83 kg)  10/28/12 198 lb (89.8 kg)    Physical Exam  Constitutional: She is oriented to person, place, and time. She appears well-developed and well-nourished. No distress.  HENT:  Mouth/Throat: Oropharynx is clear and moist. No oropharyngeal exudate.  Eyes: Conjunctivae are normal. Right eye exhibits no discharge. Left eye exhibits no discharge. No scleral icterus.  Neck: Normal range of motion. Neck supple. No JVD present. No tracheal deviation present. No thyromegaly present.  Cardiovascular: Normal rate, regular rhythm, normal heart sounds and intact distal pulses.  Exam reveals no gallop.   No murmur heard. Pulmonary/Chest: Effort normal and breath sounds normal. No stridor. No respiratory distress. She has no wheezes. She has no rales. She exhibits no tenderness.  Abdominal: Soft. Bowel sounds are normal. She exhibits no distension and no mass. There is no tenderness. There is no rebound and no guarding.  Musculoskeletal: Normal range of motion. She exhibits no edema, tenderness or deformity.  Lymphadenopathy:    She has no cervical adenopathy.  Neurological: She is oriented to person, place, and time.  Skin: Skin is warm and dry. No rash noted. She is not diaphoretic. No erythema. No pallor.  Psychiatric: She has a normal mood and affect. Her behavior is normal.  Judgment normal.  Vitals reviewed.   Lab Results  Component Value Date   WBC 3.7 (L) 01/09/2016   HGB 11.5 (L) 01/09/2016   HCT 34.7 (L) 01/09/2016   PLT 259.0 01/09/2016   GLUCOSE 98 01/09/2016   CHOL 149 01/09/2016   TRIG 68.0 01/09/2016   HDL 60.10 01/09/2016   LDLCALC 75 01/09/2016   ALT 12 01/09/2016   AST 14 01/09/2016   NA 141 01/09/2016   K 3.4 (L) 01/09/2016   CL 104 01/09/2016   CREATININE 0.89 01/09/2016   BUN 20 01/09/2016   CO2 31 01/09/2016   TSH 1.92 01/09/2016   HGBA1C 6.3 01/09/2016   MICROALBUR 0.8 01/09/2016    Mm Screening Breast Tomo Bilateral  Result Date: 10/21/2015 CLINICAL DATA:  Screening. EXAM: 2D DIGITAL SCREENING BILATERAL MAMMOGRAM WITH CAD AND ADJUNCT TOMO COMPARISON:  Previous exam(s). ACR Breast Density Category c: The breast tissue is heterogeneously dense, which may obscure small masses. FINDINGS: There are no findings suspicious  for malignancy. Images were processed with CAD. IMPRESSION: No mammographic evidence of malignancy. A result letter of this screening mammogram will be mailed directly to the patient. RECOMMENDATION: Screening mammogram in one year. (Code:SM-B-01Y) BI-RADS CATEGORY  1: Negative. Electronically Signed   By: Franki Cabot M.D.   On: 10/21/2015 10:32   Assessment & Plan:   Makita was seen today for annual exam, anemia, hypertension, diabetes, osteoarthritis and gastroesophageal reflux.  Diagnoses and all orders for this visit:  Other iron deficiency anemia- improvement noted -     CBC with Differential/Platelet; Future -     IBC panel; Future -     Ferritin; Future  Hyperlipidemia with target LDL less than 100- she has achieved her LDL goal is doing well on the statin. -     Lipid panel; Future -     TSH; Future  Obesity (BMI 30.0-34.9)- she agrees to work on her lifestyle modifications to help her lose weight.  Type 2 diabetes mellitus with complication, without long-term current use of insulin ([place])- her A1c  is 6.3%, blood sugars are adequately well controlled. -     Comprehensive metabolic panel; Future -     Hemoglobin A1c; Future -     Microalbumin / creatinine urine ratio; Future -     Cancel: Ambulatory referral to Ophthalmology  Gastroesophageal reflux disease with esophagitis- her symptoms are not well-controlled with an H2 blocker so I've upgraded to a PPI for better symptom relief. -     esomeprazole (NEXIUM) 40 MG capsule; Take 1 capsule (40 mg total) by mouth daily at 12 noon.  Essential hypertension- her blood pressures adequately well-controlled, lites and renal function are stable. -     Comprehensive metabolic panel; Future -     Urinalysis, Routine w reflex microscopic (not at [facility]); Future -     potassium chloride SA (K-DUR,KLOR-CON) 20 MEQ tablet; Take 1 tablet (20 mEq total) by mouth 2 (two) times daily.  Other vitamin B12 deficiency anemia- she is still anemic and her B12 level is in the low normal range, I've asked her to start a high-dose oral B12 supplementation. -     CBC with Differential/Platelet; Future -     Vitamin B12; Future -     Folate; Future -     cyanocobalamin 2000 MCG tablet; Take 1 tablet (2,000 mcg total) by mouth daily.  Open-angle glaucoma of right eye, mild stage, unspecified open-angle glaucoma type -     Cancel: Ambulatory referral to Ophthalmology  Osteoarthritis of spine with myelopathy, unspecified spinal region -     meloxicam (MOBIC) 15 MG tablet; Take 1 tablet (15 mg total) by mouth daily.  Degenerative disc disease, lumbar -     meloxicam (MOBIC) 15 MG tablet; Take 1 tablet (15 mg total) by mouth daily.  Primary osteoarthritis involving multiple joints -     meloxicam (MOBIC) 15 MG tablet; Take 1 tablet (15 mg total) by mouth daily.  Hypokalemia- her potassium level is low at 3.4, I've asked her to start potassium supplement therapy. -     potassium chloride SA (K-DUR,KLOR-CON) 20 MEQ tablet; Take 1 tablet (20 mEq total) by mouth 2  (two) times daily.  Need for prophylactic vaccination and inoculation against influenza -     Flu vaccine HIGH DOSE PF (Fluzone High dose)   I have discontinued Ms. Ramiro's OVER THE COUNTER MEDICATION, OVER THE COUNTER MEDICATION, ibuprofen, and ranitidine. I am also having her start on meloxicam, esomeprazole, and cyanocobalamin.  Additionally, I am having her maintain her (multivitamin,tx-minerals), fish oil-omega-3 fatty acids, latanoprost, atorvastatin, Cholecalciferol, cyclobenzaprine, glucosamine-chondroitin, valsartan-hydrochlorothiazide, aspirin, and potassium chloride SA.  Meds ordered this encounter  Medications  . meloxicam (MOBIC) 15 MG tablet    Sig: Take 1 tablet (15 mg total) by mouth daily.    Dispense:  90 tablet    Refill:  1  . esomeprazole (NEXIUM) 40 MG capsule    Sig: Take 1 capsule (40 mg total) by mouth daily at 12 noon.    Dispense:  90 capsule    Refill:  3  . cyanocobalamin 2000 MCG tablet    Sig: Take 1 tablet (2,000 mcg total) by mouth daily.    Dispense:  90 tablet    Refill:  3  . potassium chloride SA (K-DUR,KLOR-CON) 20 MEQ tablet    Sig: Take 1 tablet (20 mEq total) by mouth 2 (two) times daily.    Dispense:  60 tablet    Refill:  5   See AVS for instructions about healthy living and anticipatory guidance.  Follow-up: Return in about 3 months (around 04/10/2016).  Scarlette Calico, MD

## 2016-01-09 NOTE — Progress Notes (Signed)
Pre visit review using our clinic review tool, if applicable. No additional management support is needed unless otherwise documented below in the visit note. 

## 2016-01-09 NOTE — Patient Instructions (Signed)
Preventive Care for Adults, Female A healthy lifestyle and preventive care can promote health and wellness. Preventive health guidelines for women include the following key practices.  A routine yearly physical is a good way to check with your health care provider about your health and preventive screening. It is a chance to share any concerns and updates on your health and to receive a thorough exam.  Visit your dentist for a routine exam and preventive care every 6 months. Brush your teeth twice a day and floss once a day. Good oral hygiene prevents tooth decay and gum disease.  The frequency of eye exams is based on your age, health, family medical history, use of contact lenses, and other factors. Follow your health care provider's recommendations for frequency of eye exams.  Eat a healthy diet. Foods like vegetables, fruits, whole grains, low-fat dairy products, and lean protein foods contain the nutrients you need without too many calories. Decrease your intake of foods high in solid fats, added sugars, and salt. Eat the right amount of calories for you.Get information about a proper diet from your health care provider, if necessary.  Regular physical exercise is one of the most important things you can do for your health. Most adults should get at least 150 minutes of moderate-intensity exercise (any activity that increases your heart rate and causes you to sweat) each week. In addition, most adults need muscle-strengthening exercises on 2 or more days a week.  Maintain a healthy weight. The body mass index (BMI) is a screening tool to identify possible weight problems. It provides an estimate of body fat based on height and weight. Your health care provider can find your BMI and can help you achieve or maintain a healthy weight.For adults 20 years and older:  A BMI below 18.5 is considered underweight.  A BMI of 18.5 to 24.9 is normal.  A BMI of 25 to 29.9 is considered overweight.  A  BMI of 30 and above is considered obese.  Maintain normal blood lipids and cholesterol levels by exercising and minimizing your intake of saturated fat. Eat a balanced diet with plenty of fruit and vegetables. Blood tests for lipids and cholesterol should begin at age 45 and be repeated every 5 years. If your lipid or cholesterol levels are high, you are over 50, or you are at high risk for heart disease, you may need your cholesterol levels checked more frequently.Ongoing high lipid and cholesterol levels should be treated with medicines if diet and exercise are not working.  If you smoke, find out from your health care provider how to quit. If you do not use tobacco, do not start.  Lung cancer screening is recommended for adults aged 45-80 years who are at high risk for developing lung cancer because of a history of smoking. A yearly low-dose CT scan of the lungs is recommended for people who have at least a 30-pack-year history of smoking and are a current smoker or have quit within the past 15 years. A pack year of smoking is smoking an average of 1 pack of cigarettes a day for 1 year (for example: 1 pack a day for 30 years or 2 packs a day for 15 years). Yearly screening should continue until the smoker has stopped smoking for at least 15 years. Yearly screening should be stopped for people who develop a health problem that would prevent them from having lung cancer treatment.  If you are pregnant, do not drink alcohol. If you are  breastfeeding, be very cautious about drinking alcohol. If you are not pregnant and choose to drink alcohol, do not have more than 1 drink per day. One drink is considered to be 12 ounces (355 mL) of beer, 5 ounces (148 mL) of wine, or 1.5 ounces (44 mL) of liquor.  Avoid use of street drugs. Do not share needles with anyone. Ask for help if you need support or instructions about stopping the use of drugs.  High blood pressure causes heart disease and increases the risk  of stroke. Your blood pressure should be checked at least every 1 to 2 years. Ongoing high blood pressure should be treated with medicines if weight loss and exercise do not work.  If you are 55-79 years old, ask your health care provider if you should take aspirin to prevent strokes.  Diabetes screening is done by taking a blood sample to check your blood glucose level after you have not eaten for a certain period of time (fasting). If you are not overweight and you do not have risk factors for diabetes, you should be screened once every 3 years starting at age 45. If you are overweight or obese and you are 40-70 years of age, you should be screened for diabetes every year as part of your cardiovascular risk assessment.  Breast cancer screening is essential preventive care for women. You should practice "breast self-awareness." This means understanding the normal appearance and feel of your breasts and may include breast self-examination. Any changes detected, no matter how small, should be reported to a health care provider. Women in their 20s and 30s should have a clinical breast exam (CBE) by a health care provider as part of a regular health exam every 1 to 3 years. After age 40, women should have a CBE every year. Starting at age 40, women should consider having a mammogram (breast X-ray test) every year. Women who have a family history of breast cancer should talk to their health care provider about genetic screening. Women at a high risk of breast cancer should talk to their health care providers about having an MRI and a mammogram every year.  Breast cancer gene (BRCA)-related cancer risk assessment is recommended for women who have family members with BRCA-related cancers. BRCA-related cancers include breast, ovarian, tubal, and peritoneal cancers. Having family members with these cancers may be associated with an increased risk for harmful changes (mutations) in the breast cancer genes BRCA1 and  BRCA2. Results of the assessment will determine the need for genetic counseling and BRCA1 and BRCA2 testing.  Your health care provider may recommend that you be screened regularly for cancer of the pelvic organs (ovaries, uterus, and vagina). This screening involves a pelvic examination, including checking for microscopic changes to the surface of your cervix (Pap test). You may be encouraged to have this screening done every 3 years, beginning at age 21.  For women ages 30-65, health care providers may recommend pelvic exams and Pap testing every 3 years, or they may recommend the Pap and pelvic exam, combined with testing for human papilloma virus (HPV), every 5 years. Some types of HPV increase your risk of cervical cancer. Testing for HPV may also be done on women of any age with unclear Pap test results.  Other health care providers may not recommend any screening for nonpregnant women who are considered low risk for pelvic cancer and who do not have symptoms. Ask your health care provider if a screening pelvic exam is right for   you.  If you have had past treatment for cervical cancer or a condition that could lead to cancer, you need Pap tests and screening for cancer for at least 20 years after your treatment. If Pap tests have been discontinued, your risk factors (such as having a new sexual partner) need to be reassessed to determine if screening should resume. Some women have medical problems that increase the chance of getting cervical cancer. In these cases, your health care provider may recommend more frequent screening and Pap tests.  Colorectal cancer can be detected and often prevented. Most routine colorectal cancer screening begins at the age of 50 years and continues through age 75 years. However, your health care provider may recommend screening at an earlier age if you have risk factors for colon cancer. On a yearly basis, your health care provider may provide home test kits to check  for hidden blood in the stool. Use of a small camera at the end of a tube, to directly examine the colon (sigmoidoscopy or colonoscopy), can detect the earliest forms of colorectal cancer. Talk to your health care provider about this at age 50, when routine screening begins. Direct exam of the colon should be repeated every 5-10 years through age 75 years, unless early forms of precancerous polyps or small growths are found.  People who are at an increased risk for hepatitis B should be screened for this virus. You are considered at high risk for hepatitis B if:  You were born in a country where hepatitis B occurs often. Talk with your health care provider about which countries are considered high risk.  Your parents were born in a high-risk country and you have not received a shot to protect against hepatitis B (hepatitis B vaccine).  You have HIV or AIDS.  You use needles to inject street drugs.  You live with, or have sex with, someone who has hepatitis B.  You get hemodialysis treatment.  You take certain medicines for conditions like cancer, organ transplantation, and autoimmune conditions.  Hepatitis C blood testing is recommended for all people born from 1945 through 1965 and any individual with known risks for hepatitis C.  Practice safe sex. Use condoms and avoid high-risk sexual practices to reduce the spread of sexually transmitted infections (STIs). STIs include gonorrhea, chlamydia, syphilis, trichomonas, herpes, HPV, and human immunodeficiency virus (HIV). Herpes, HIV, and HPV are viral illnesses that have no cure. They can result in disability, cancer, and death.  You should be screened for sexually transmitted illnesses (STIs) including gonorrhea and chlamydia if:  You are sexually active and are younger than 24 years.  You are older than 24 years and your health care provider tells you that you are at risk for this type of infection.  Your sexual activity has changed  since you were last screened and you are at an increased risk for chlamydia or gonorrhea. Ask your health care provider if you are at risk.  If you are at risk of being infected with HIV, it is recommended that you take a prescription medicine daily to prevent HIV infection. This is called preexposure prophylaxis (PrEP). You are considered at risk if:  You are sexually active and do not regularly use condoms or know the HIV status of your partner(s).  You take drugs by injection.  You are sexually active with a partner who has HIV.  Talk with your health care provider about whether you are at high risk of being infected with HIV. If   you choose to begin PrEP, you should first be tested for HIV. You should then be tested every 3 months for as long as you are taking PrEP.  Osteoporosis is a disease in which the bones lose minerals and strength with aging. This can result in serious bone fractures or breaks. The risk of osteoporosis can be identified using a bone density scan. Women ages 67 years and over and women at risk for fractures or osteoporosis should discuss screening with their health care providers. Ask your health care provider whether you should take a calcium supplement or vitamin D to reduce the rate of osteoporosis.  Menopause can be associated with physical symptoms and risks. Hormone replacement therapy is available to decrease symptoms and risks. You should talk to your health care provider about whether hormone replacement therapy is right for you.  Use sunscreen. Apply sunscreen liberally and repeatedly throughout the day. You should seek shade when your shadow is shorter than you. Protect yourself by wearing long sleeves, pants, a wide-brimmed hat, and sunglasses year round, whenever you are outdoors.  Once a month, do a whole body skin exam, using a mirror to look at the skin on your back. Tell your health care provider of new moles, moles that have irregular borders, moles that  are larger than a pencil eraser, or moles that have changed in shape or color.  Stay current with required vaccines (immunizations).  Influenza vaccine. All adults should be immunized every year.  Tetanus, diphtheria, and acellular pertussis (Td, Tdap) vaccine. Pregnant women should receive 1 dose of Tdap vaccine during each pregnancy. The dose should be obtained regardless of the length of time since the last dose. Immunization is preferred during the 27th-36th week of gestation. An adult who has not previously received Tdap or who does not know her vaccine status should receive 1 dose of Tdap. This initial dose should be followed by tetanus and diphtheria toxoids (Td) booster doses every 10 years. Adults with an unknown or incomplete history of completing a 3-dose immunization series with Td-containing vaccines should begin or complete a primary immunization series including a Tdap dose. Adults should receive a Td booster every 10 years.  Varicella vaccine. An adult without evidence of immunity to varicella should receive 2 doses or a second dose if she has previously received 1 dose. Pregnant females who do not have evidence of immunity should receive the first dose after pregnancy. This first dose should be obtained before leaving the health care facility. The second dose should be obtained 4-8 weeks after the first dose.  Human papillomavirus (HPV) vaccine. Females aged 13-26 years who have not received the vaccine previously should obtain the 3-dose series. The vaccine is not recommended for use in pregnant females. However, pregnancy testing is not needed before receiving a dose. If a female is found to be pregnant after receiving a dose, no treatment is needed. In that case, the remaining doses should be delayed until after the pregnancy. Immunization is recommended for any person with an immunocompromised condition through the age of 61 years if she did not get any or all doses earlier. During the  3-dose series, the second dose should be obtained 4-8 weeks after the first dose. The third dose should be obtained 24 weeks after the first dose and 16 weeks after the second dose.  Zoster vaccine. One dose is recommended for adults aged 30 years or older unless certain conditions are present.  Measles, mumps, and rubella (MMR) vaccine. Adults born  before 1957 generally are considered immune to measles and mumps. Adults born in 1957 or later should have 1 or more doses of MMR vaccine unless there is a contraindication to the vaccine or there is laboratory evidence of immunity to each of the three diseases. A routine second dose of MMR vaccine should be obtained at least 28 days after the first dose for students attending postsecondary schools, health care workers, or international travelers. People who received inactivated measles vaccine or an unknown type of measles vaccine during 1963-1967 should receive 2 doses of MMR vaccine. People who received inactivated mumps vaccine or an unknown type of mumps vaccine before 1979 and are at high risk for mumps infection should consider immunization with 2 doses of MMR vaccine. For females of childbearing age, rubella immunity should be determined. If there is no evidence of immunity, females who are not pregnant should be vaccinated. If there is no evidence of immunity, females who are pregnant should delay immunization until after pregnancy. Unvaccinated health care workers born before 1957 who lack laboratory evidence of measles, mumps, or rubella immunity or laboratory confirmation of disease should consider measles and mumps immunization with 2 doses of MMR vaccine or rubella immunization with 1 dose of MMR vaccine.  Pneumococcal 13-valent conjugate (PCV13) vaccine. When indicated, a person who is uncertain of his immunization history and has no record of immunization should receive the PCV13 vaccine. All adults 65 years of age and older should receive this  vaccine. An adult aged 19 years or older who has certain medical conditions and has not been previously immunized should receive 1 dose of PCV13 vaccine. This PCV13 should be followed with a dose of pneumococcal polysaccharide (PPSV23) vaccine. Adults who are at high risk for pneumococcal disease should obtain the PPSV23 vaccine at least 8 weeks after the dose of PCV13 vaccine. Adults older than 73 years of age who have normal immune system function should obtain the PPSV23 vaccine dose at least 1 year after the dose of PCV13 vaccine.  Pneumococcal polysaccharide (PPSV23) vaccine. When PCV13 is also indicated, PCV13 should be obtained first. All adults aged 65 years and older should be immunized. An adult younger than age 65 years who has certain medical conditions should be immunized. Any person who resides in a nursing home or long-term care facility should be immunized. An adult smoker should be immunized. People with an immunocompromised condition and certain other conditions should receive both PCV13 and PPSV23 vaccines. People with human immunodeficiency virus (HIV) infection should be immunized as soon as possible after diagnosis. Immunization during chemotherapy or radiation therapy should be avoided. Routine use of PPSV23 vaccine is not recommended for American Indians, [place] Natives, or people younger than 65 years unless there are medical conditions that require PPSV23 vaccine. When indicated, people who have unknown immunization and have no record of immunization should receive PPSV23 vaccine. One-time revaccination 5 years after the first dose of PPSV23 is recommended for people aged 19-64 years who have chronic kidney failure, nephrotic syndrome, asplenia, or immunocompromised conditions. People who received 1-2 doses of PPSV23 before age 65 years should receive another dose of PPSV23 vaccine at age 65 years or later if at least 5 years have passed since the previous dose. Doses of PPSV23 are not  needed for people immunized with PPSV23 at or after age 65 years.  Meningococcal vaccine. Adults with asplenia or persistent complement component deficiencies should receive 2 doses of quadrivalent meningococcal conjugate (MenACWY-D) vaccine. The doses should be obtained   at least 2 months apart. Microbiologists working with certain meningococcal bacteria, [place] recruits, people at risk during an outbreak, and people who travel to or live in countries with a high rate of meningitis should be immunized. A first-year college student up through age 34 years who is living in a residence hall should receive a dose if she did not receive a dose on or after her 16th birthday. Adults who have certain high-risk conditions should receive one or more doses of vaccine.  Hepatitis A vaccine. Adults who wish to be protected from this disease, have certain high-risk conditions, work with hepatitis A-infected animals, work in hepatitis A research labs, or travel to or work in countries with a high rate of hepatitis A should be immunized. Adults who were previously unvaccinated and who anticipate close contact with an international adoptee during the first 60 days after arrival in the [place] from a country with a high rate of hepatitis A should be immunized.  Hepatitis B vaccine. Adults who wish to be protected from this disease, have certain high-risk conditions, may be exposed to blood or other infectious body fluids, are household contacts or sex partners of hepatitis B positive people, are clients or workers in certain care facilities, or travel to or work in countries with a high rate of hepatitis B should be immunized.  Haemophilus influenzae type b (Hib) vaccine. A previously unvaccinated person with asplenia or sickle cell disease or having a scheduled splenectomy should receive 1 dose of Hib vaccine. Regardless of previous immunization, a recipient of a hematopoietic stem cell transplant should receive a  3-dose series 6-12 months after her successful transplant. Hib vaccine is not recommended for adults with HIV infection. Preventive Services / Frequency Ages 35 to 4 years  Blood pressure check.** / Every 3-5 years.  Lipid and cholesterol check.** / Every 5 years beginning at age 60.  Clinical breast exam.** / Every 3 years for women in their 71s and 10s.  BRCA-related cancer risk assessment.** / For women who have family members with a BRCA-related cancer (breast, ovarian, tubal, or peritoneal cancers).  Pap test.** / Every 2 years from ages 76 through 26. Every 3 years starting at age 61 through age 76 or 93 with a history of 3 consecutive normal Pap tests.  HPV screening.** / Every 3 years from ages 37 through ages 60 to 51 with a history of 3 consecutive normal Pap tests.  Hepatitis C blood test.** / For any individual with known risks for hepatitis C.  Skin self-exam. / Monthly.  Influenza vaccine. / Every year.  Tetanus, diphtheria, and acellular pertussis (Tdap, Td) vaccine.** / Consult your health care provider. Pregnant women should receive 1 dose of Tdap vaccine during each pregnancy. 1 dose of Td every 10 years.  Varicella vaccine.** / Consult your health care provider. Pregnant females who do not have evidence of immunity should receive the first dose after pregnancy.  HPV vaccine. / 3 doses over 6 months, if 93 and younger. The vaccine is not recommended for use in pregnant females. However, pregnancy testing is not needed before receiving a dose.  Measles, mumps, rubella (MMR) vaccine.** / You need at least 1 dose of MMR if you were born in 1957 or later. You may also need a 2nd dose. For females of childbearing age, rubella immunity should be determined. If there is no evidence of immunity, females who are not pregnant should be vaccinated. If there is no evidence of immunity, females who are  pregnant should delay immunization until after pregnancy.  Pneumococcal  13-valent conjugate (PCV13) vaccine.** / Consult your health care provider.  Pneumococcal polysaccharide (PPSV23) vaccine.** / 1 to 2 doses if you smoke cigarettes or if you have certain conditions.  Meningococcal vaccine.** / 1 dose if you are age 68 to 8 years and a Market researcher living in a residence hall, or have one of several medical conditions, you need to get vaccinated against meningococcal disease. You may also need additional booster doses.  Hepatitis A vaccine.** / Consult your health care provider.  Hepatitis B vaccine.** / Consult your health care provider.  Haemophilus influenzae type b (Hib) vaccine.** / Consult your health care provider. Ages 7 to 53 years  Blood pressure check.** / Every year.  Lipid and cholesterol check.** / Every 5 years beginning at age 25 years.  Lung cancer screening. / Every year if you are aged 11-80 years and have a 30-pack-year history of smoking and currently smoke or have quit within the past 15 years. Yearly screening is stopped once you have quit smoking for at least 15 years or develop a health problem that would prevent you from having lung cancer treatment.  Clinical breast exam.** / Every year after age 48 years.  BRCA-related cancer risk assessment.** / For women who have family members with a BRCA-related cancer (breast, ovarian, tubal, or peritoneal cancers).  Mammogram.** / Every year beginning at age 41 years and continuing for as long as you are in good health. Consult with your health care provider.  Pap test.** / Every 3 years starting at age 65 years through age 37 or 70 years with a history of 3 consecutive normal Pap tests.  HPV screening.** / Every 3 years from ages 72 years through ages 60 to 40 years with a history of 3 consecutive normal Pap tests.  Fecal occult blood test (FOBT) of stool. / Every year beginning at age 21 years and continuing until age 5 years. You may not need to do this test if you get  a colonoscopy every 10 years.  Flexible sigmoidoscopy or colonoscopy.** / Every 5 years for a flexible sigmoidoscopy or every 10 years for a colonoscopy beginning at age 35 years and continuing until age 48 years.  Hepatitis C blood test.** / For all people born from [birth year] through 1965 and any individual with known risks for hepatitis C.  Skin self-exam. / Monthly.  Influenza vaccine. / Every year.  Tetanus, diphtheria, and acellular pertussis (Tdap/Td) vaccine.** / Consult your health care provider. Pregnant women should receive 1 dose of Tdap vaccine during each pregnancy. 1 dose of Td every 10 years.  Varicella vaccine.** / Consult your health care provider. Pregnant females who do not have evidence of immunity should receive the first dose after pregnancy.  Zoster vaccine.** / 1 dose for adults aged 30 years or older.  Measles, mumps, rubella (MMR) vaccine.** / You need at least 1 dose of MMR if you were born in 1957 or later. You may also need a second dose. For females of childbearing age, rubella immunity should be determined. If there is no evidence of immunity, females who are not pregnant should be vaccinated. If there is no evidence of immunity, females who are pregnant should delay immunization until after pregnancy.  Pneumococcal 13-valent conjugate (PCV13) vaccine.** / Consult your health care provider.  Pneumococcal polysaccharide (PPSV23) vaccine.** / 1 to 2 doses if you smoke cigarettes or if you have certain conditions.  Meningococcal vaccine.** /  Consult your health care provider.  Hepatitis A vaccine.** / Consult your health care provider.  Hepatitis B vaccine.** / Consult your health care provider.  Haemophilus influenzae type b (Hib) vaccine.** / Consult your health care provider. Ages 64 years and over  Blood pressure check.** / Every year.  Lipid and cholesterol check.** / Every 5 years beginning at age 23 years.  Lung cancer screening. / Every year if you  are aged 16-80 years and have a 30-pack-year history of smoking and currently smoke or have quit within the past 15 years. Yearly screening is stopped once you have quit smoking for at least 15 years or develop a health problem that would prevent you from having lung cancer treatment.  Clinical breast exam.** / Every year after age 74 years.  BRCA-related cancer risk assessment.** / For women who have family members with a BRCA-related cancer (breast, ovarian, tubal, or peritoneal cancers).  Mammogram.** / Every year beginning at age 44 years and continuing for as long as you are in good health. Consult with your health care provider.  Pap test.** / Every 3 years starting at age 58 years through age 22 or 39 years with 3 consecutive normal Pap tests. Testing can be stopped between 65 and 70 years with 3 consecutive normal Pap tests and no abnormal Pap or HPV tests in the past 10 years.  HPV screening.** / Every 3 years from ages 64 years through ages 70 or 61 years with a history of 3 consecutive normal Pap tests. Testing can be stopped between 65 and 70 years with 3 consecutive normal Pap tests and no abnormal Pap or HPV tests in the past 10 years.  Fecal occult blood test (FOBT) of stool. / Every year beginning at age 40 years and continuing until age 27 years. You may not need to do this test if you get a colonoscopy every 10 years.  Flexible sigmoidoscopy or colonoscopy.** / Every 5 years for a flexible sigmoidoscopy or every 10 years for a colonoscopy beginning at age 7 years and continuing until age 32 years.  Hepatitis C blood test.** / For all people born from [birth year] through 1963 and any individual with known risks for hepatitis C.  Osteoporosis screening.** / A one-time screening for women ages 30 years and over and women at risk for fractures or osteoporosis.  Skin self-exam. / Monthly.  Influenza vaccine. / Every year.  Tetanus, diphtheria, and acellular pertussis (Tdap/Td)  vaccine.** / 1 dose of Td every 10 years.  Varicella vaccine.** / Consult your health care provider.  Zoster vaccine.** / 1 dose for adults aged 35 years or older.  Pneumococcal 13-valent conjugate (PCV13) vaccine.** / Consult your health care provider.  Pneumococcal polysaccharide (PPSV23) vaccine.** / 1 dose for all adults aged 46 years and older.  Meningococcal vaccine.** / Consult your health care provider.  Hepatitis A vaccine.** / Consult your health care provider.  Hepatitis B vaccine.** / Consult your health care provider.  Haemophilus influenzae type b (Hib) vaccine.** / Consult your health care provider. ** Family history and personal history of risk and conditions may change your health care provider's recommendations.   This information is not intended to replace advice given to you by your health care provider. Make sure you discuss any questions you have with your health care provider.   Document Released: 05/12/2001 Document Revised: 04/06/2014 Document Reviewed: 08/11/2010 Elsevier Interactive Patient Education [employer].

## 2016-01-12 NOTE — Assessment & Plan Note (Signed)

## 2016-01-16 LAB — HM DIABETES EYE EXAM

## 2016-01-17 ENCOUNTER — Other Ambulatory Visit: Payer: Self-pay | Admitting: *Deleted

## 2016-01-17 DIAGNOSIS — M4710 Other spondylosis with myelopathy, site unspecified: Secondary | ICD-10-CM

## 2016-01-17 DIAGNOSIS — E785 Hyperlipidemia, unspecified: Secondary | ICD-10-CM

## 2016-01-17 DIAGNOSIS — M51369 Other intervertebral disc degeneration, lumbar region without mention of lumbar back pain or lower extremity pain: Secondary | ICD-10-CM

## 2016-01-17 DIAGNOSIS — M159 Polyosteoarthritis, unspecified: Secondary | ICD-10-CM

## 2016-01-17 DIAGNOSIS — D518 Other vitamin B12 deficiency anemias: Secondary | ICD-10-CM

## 2016-01-17 DIAGNOSIS — E876 Hypokalemia: Secondary | ICD-10-CM

## 2016-01-17 DIAGNOSIS — M150 Primary generalized (osteo)arthritis: Secondary | ICD-10-CM

## 2016-01-17 DIAGNOSIS — K2100 Gastro-esophageal reflux disease with esophagitis, without bleeding: Secondary | ICD-10-CM

## 2016-01-17 DIAGNOSIS — M5136 Other intervertebral disc degeneration, lumbar region: Secondary | ICD-10-CM

## 2016-01-17 DIAGNOSIS — I10 Essential (primary) hypertension: Secondary | ICD-10-CM

## 2016-01-17 MED ORDER — MELOXICAM 15 MG PO TABS: 15.0000 mg | ORAL_TABLET | Freq: Every day | ORAL | 1 refills | Status: DC

## 2016-01-17 NOTE — Telephone Encounter (Signed)
Pt left msg on triage stating needing written rx for her Mobic so she can send to [facility] hospital.../lmb

## 2016-01-17 NOTE — Telephone Encounter (Signed)
Printed rx for PCP. WIll cancel refills at local when pt is contacted.

## 2016-01-17 NOTE — Addendum Note (Signed)
Addended by: Johney Frame, Adelina Mings M on: 01/17/2016 03:00 PM   Modules accepted: Orders

## 2016-01-21 MED ORDER — ESOMEPRAZOLE MAGNESIUM 40 MG PO CPDR: 40.0000 mg | DELAYED_RELEASE_CAPSULE | Freq: Every day | ORAL | 3 refills | Status: DC

## 2016-01-21 MED ORDER — POTASSIUM CHLORIDE CRYS ER 20 MEQ PO TBCR: 20.0000 meq | EXTENDED_RELEASE_TABLET | Freq: Two times a day (BID) | ORAL | 5 refills | Status: DC

## 2016-01-21 MED ORDER — VALSARTAN-HYDROCHLOROTHIAZIDE 160-25 MG PO TABS: 1.0000 | ORAL_TABLET | Freq: Every day | ORAL | 3 refills | Status: DC

## 2016-01-21 MED ORDER — ATORVASTATIN CALCIUM 20 MG PO TABS: 20.0000 mg | ORAL_TABLET | Freq: Every day | ORAL | 3 refills | Status: DC

## 2016-01-21 NOTE — Addendum Note (Signed)
Addended by: Karle Barr on: 01/21/2016 09:26 AM   Modules accepted: Orders

## 2016-01-21 NOTE — Telephone Encounter (Signed)
Pt informed rx's are ready for pick up.  

## 2016-01-22 ENCOUNTER — Encounter: Payer: Self-pay | Admitting: Internal Medicine

## 2016-02-06 MED ORDER — CYANOCOBALAMIN 2000 MCG PO TABS: 2000.0000 ug | ORAL_TABLET | Freq: Every day | ORAL | 3 refills | Status: DC

## 2016-02-06 MED ORDER — POTASSIUM CHLORIDE CRYS ER 20 MEQ PO TBCR: 20.0000 meq | EXTENDED_RELEASE_TABLET | Freq: Two times a day (BID) | ORAL | 5 refills | Status: DC

## 2016-02-06 NOTE — Telephone Encounter (Signed)
Pt came in and requested 2 new rxs to be printed and signed so she can take to the [place].   Rxs: B12 and Klor Con

## 2016-02-06 NOTE — Addendum Note (Signed)
Addended by: Karle Barr on: 02/06/2016 09:25 AM   Modules accepted: Orders

## 2016-03-09 ENCOUNTER — Encounter: Payer: Self-pay | Admitting: Nurse Practitioner

## 2016-03-09 ENCOUNTER — Ambulatory Visit (INDEPENDENT_AMBULATORY_CARE_PROVIDER_SITE_OTHER): Payer: Medicare Other | Admitting: Nurse Practitioner

## 2016-03-09 VITALS — BP 122/54 | HR 97 | Temp 99.2°F | Ht 66.0 in | Wt 192.0 lb

## 2016-03-09 DIAGNOSIS — E118 Type 2 diabetes mellitus with unspecified complications: Secondary | ICD-10-CM | POA: Diagnosis not present

## 2016-03-09 DIAGNOSIS — J069 Acute upper respiratory infection, unspecified: Secondary | ICD-10-CM

## 2016-03-09 LAB — GLUCOSE, POCT (MANUAL RESULT ENTRY): POC GLUCOSE: 119 mg/dL — AB (ref 70–99)

## 2016-03-09 MED ORDER — GUAIFENESIN ER 600 MG PO TB12: 600.0000 mg | ORAL_TABLET | Freq: Two times a day (BID) | ORAL | 0 refills | Status: DC | PRN

## 2016-03-09 MED ORDER — PROMETHAZINE-DM 6.25-15 MG/5ML PO SYRP: 5.0000 mL | ORAL_SOLUTION | Freq: Three times a day (TID) | ORAL | 0 refills | Status: DC | PRN

## 2016-03-09 MED ORDER — METHYLPREDNISOLONE ACETATE 40 MG/ML IJ SUSP
40.0000 mg | Freq: Once | INTRAMUSCULAR | Status: AC
  Administered 2016-03-09: 40 mg via INTRAMUSCULAR

## 2016-03-09 MED ORDER — AZELASTINE-FLUTICASONE 137-50 MCG/ACT NA SUSP: 1.0000 | Freq: Two times a day (BID) | NASAL | 0 refills | Status: DC

## 2016-03-09 NOTE — Progress Notes (Signed)
Reviewed with patient in office. See office note

## 2016-03-09 NOTE — Patient Instructions (Signed)

## 2016-03-09 NOTE — Progress Notes (Signed)
Subjective:  Patient ID: Alexandria [name], female    DOB: Jan 30, 1941  Age: 75 y.o. MRN: [id number]  CC: Sore Throat (sore throat,coughing slight yellow mucus,headache,wheezing at night,body sore,congestion for 3 days.took ibuprofen. )  Sore Throat   This is a new problem. The current episode started in the past 7 days. The problem has been unchanged. There has been no fever. Associated symptoms include congestion, coughing, headaches, a hoarse voice and swollen glands. Pertinent negatives include no abdominal pain, diarrhea, drooling, ear discharge, ear pain, plugged ear sensation, neck pain, shortness of breath, stridor, trouble swallowing or vomiting. She has had no exposure to strep or mono. She has tried NSAIDs for the symptoms. The treatment provided mild relief.    Outpatient Medications Prior to Visit  Medication Sig Dispense Refill  . aspirin 81 MG tablet Take 1 tablet (81 mg total) by mouth daily. 30 tablet 11  . atorvastatin (LIPITOR) 20 MG tablet Take 1 tablet (20 mg total) by mouth daily. 90 tablet 3  . Cholecalciferol 50000 UNITS TABS Take 1 tablet by mouth once a week. 12 tablet 3  . cyanocobalamin 2000 MCG tablet Take 1 tablet (2,000 mcg total) by mouth daily. 90 tablet 3  . esomeprazole (NEXIUM) 40 MG capsule Take 1 capsule (40 mg total) by mouth daily at 12 noon. 90 capsule 3  . fish oil-omega-3 fatty acids 1000 MG capsule Take 1 capsule (1 g total) by mouth daily. 90 capsule 3  . glucosamine-chondroitin 500-400 MG tablet Take 1 tablet by mouth daily. 90 tablet 3  . latanoprost (XALATAN) 0.005 % ophthalmic solution Place 1 drop into both eyes as directed. 2.5 mL 5  . meloxicam (MOBIC) 15 MG tablet Take 1 tablet (15 mg total) by mouth daily. 90 tablet 1  . Multiple Vitamins-Minerals (MULTIVITAMIN,TX-MINERALS) tablet Take 1 tablet by mouth daily.      . potassium chloride SA (K-DUR,KLOR-CON) 20 MEQ tablet Take 1 tablet (20 mEq total) by mouth 2 (two) times daily. 60 tablet 5  .  valsartan-hydrochlorothiazide (DIOVAN-HCT) 160-25 MG tablet Take 1 tablet by mouth daily. 90 tablet 3  . cyclobenzaprine (FLEXERIL) 10 MG tablet Take 1 tablet (10 mg total) by mouth 2 (two) times daily as needed. (Patient not taking: Reported on 03/09/2016) 180 tablet 3   No facility-administered medications prior to visit.     ROS See HPI  Objective:  BP (!) 122/54   Pulse 97   Temp 99.2 F (37.3 C)   Ht 5\' 6"  (1.676 m)   Wt 192 lb (87.1 kg)   SpO2 100%   BMI 30.99 kg/m   BP Readings from Last 3 Encounters:  03/09/16 (!) 122/54  01/09/16 118/64  01/09/15 128/70    Wt Readings from Last 3 Encounters:  03/09/16 192 lb (87.1 kg)  01/09/16 190 lb 8 oz (86.4 kg)  01/09/15 183 lb (83 kg)    Physical Exam  Constitutional: She is oriented to person, place, and time.  HENT:  Right Ear: Tympanic membrane, external ear and ear canal normal.  Left Ear: Tympanic membrane, external ear and ear canal normal.  Nose: Mucosal edema and rhinorrhea present. Right sinus exhibits no maxillary sinus tenderness and no frontal sinus tenderness. Left sinus exhibits no maxillary sinus tenderness and no frontal sinus tenderness.  Mouth/Throat: Uvula is midline. No trismus in the jaw. Posterior oropharyngeal erythema present. No oropharyngeal exudate.  Eyes: No scleral icterus.  Neck: Normal range of motion. Neck supple.  Cardiovascular: Normal rate and normal  heart sounds.   Pulmonary/Chest: Effort normal and breath sounds normal.  Musculoskeletal: She exhibits no edema.  Lymphadenopathy:    She has no cervical adenopathy.  Neurological: She is alert and oriented to person, place, and time.  Vitals reviewed.   Lab Results  Component Value Date   WBC 3.7 (L) 01/09/2016   HGB 11.5 (L) 01/09/2016   HCT 34.7 (L) 01/09/2016   PLT 259.0 01/09/2016   GLUCOSE 98 01/09/2016   CHOL 149 01/09/2016   TRIG 68.0 01/09/2016   HDL 60.10 01/09/2016   LDLCALC 75 01/09/2016   ALT 12 01/09/2016   AST  14 01/09/2016   NA 141 01/09/2016   K 3.4 (L) 01/09/2016   CL 104 01/09/2016   CREATININE 0.89 01/09/2016   BUN 20 01/09/2016   CO2 31 01/09/2016   TSH 1.92 01/09/2016   HGBA1C 6.3 01/09/2016   MICROALBUR 0.8 01/09/2016    Mm Screening Breast Tomo Bilateral  Result Date: 10/21/2015 CLINICAL DATA:  Screening. EXAM: 2D DIGITAL SCREENING BILATERAL MAMMOGRAM WITH CAD AND ADJUNCT TOMO COMPARISON:  Previous exam(s). ACR Breast Density Category c: The breast tissue is heterogeneously dense, which may obscure small masses. FINDINGS: There are no findings suspicious for malignancy. Images were processed with CAD. IMPRESSION: No mammographic evidence of malignancy. A result letter of this screening mammogram will be mailed directly to the patient. RECOMMENDATION: Screening mammogram in one year. (Code:SM-B-01Y) BI-RADS CATEGORY  1: Negative. Electronically Signed   By: Franki Cabot M.D.   On: 10/21/2015 10:32   Assessment & Plan:   Keiko was seen today for sore throat.  Diagnoses and all orders for this visit:  Acute URI -     guaiFENesin (MUCINEX) 600 MG 12 hr tablet; Take 1 tablet (600 mg total) by mouth 2 (two) times daily as needed for cough or to loosen phlegm. -     Azelastine-Fluticasone (DYMISTA) 137-50 MCG/ACT SUSP; Place 1 spray into the nose 2 (two) times daily. -     promethazine-dextromethorphan (PROMETHAZINE-DM) 6.25-15 MG/5ML syrup; Take 5 mLs by mouth 3 (three) times daily as needed for cough. -     methylPREDNISolone acetate (DEPO-MEDROL) injection 40 mg; Inject 1 mL (40 mg total) into the muscle once.  Type 2 diabetes mellitus with complication, without long-term current use of insulin (HCC) -     POCT Glucose (CBG)   I am having Ms. Balistreri start on guaiFENesin, Azelastine-Fluticasone, and promethazine-dextromethorphan. I am also having her maintain her (multivitamin,tx-minerals), fish oil-omega-3 fatty acids, latanoprost, Cholecalciferol, cyclobenzaprine,  glucosamine-chondroitin, aspirin, meloxicam, valsartan-hydrochlorothiazide, esomeprazole, atorvastatin, potassium chloride SA, and cyanocobalamin. We administered methylPREDNISolone acetate.  Meds ordered this encounter  Medications  . guaiFENesin (MUCINEX) 600 MG 12 hr tablet    Sig: Take 1 tablet (600 mg total) by mouth 2 (two) times daily as needed for cough or to loosen phlegm.    Dispense:  14 tablet    Refill:  0    Order Specific Question:   Supervising Provider    Answer:   Cassandria Anger [[id number]]  . Azelastine-Fluticasone (DYMISTA) 137-50 MCG/ACT SUSP    Sig: Place 1 spray into the nose 2 (two) times daily.    Dispense:  23 g    Refill:  0    Order Specific Question:   Supervising Provider    Answer:   Cassandria Anger [[id number]]  . promethazine-dextromethorphan (PROMETHAZINE-DM) 6.25-15 MG/5ML syrup    Sig: Take 5 mLs by mouth 3 (three) times daily as needed for cough.  Dispense:  240 mL    Refill:  0    Order Specific Question:   Supervising Provider    Answer:   Cassandria Anger [[id number]]  . methylPREDNISolone acetate (DEPO-MEDROL) injection 40 mg    Follow-up: No Follow-up on file.  Wilfred Lacy, NP

## 2016-03-09 NOTE — Progress Notes (Signed)
Pre visit review using our clinic review tool, if applicable. No additional management support is needed unless otherwise documented below in the visit note. 

## 2016-08-04 DIAGNOSIS — H25812 Combined forms of age-related cataract, left eye: Secondary | ICD-10-CM | POA: Diagnosis not present

## 2016-08-04 DIAGNOSIS — H401131 Primary open-angle glaucoma, bilateral, mild stage: Secondary | ICD-10-CM | POA: Diagnosis not present

## 2016-08-04 DIAGNOSIS — H16223 Keratoconjunctivitis sicca, not specified as Sjogren's, bilateral: Secondary | ICD-10-CM | POA: Diagnosis not present

## 2016-08-04 DIAGNOSIS — H43813 Vitreous degeneration, bilateral: Secondary | ICD-10-CM | POA: Diagnosis not present

## 2016-08-12 ENCOUNTER — Encounter: Payer: Self-pay | Admitting: Gastroenterology

## 2016-09-01 ENCOUNTER — Encounter: Payer: Self-pay | Admitting: Gastroenterology

## 2016-10-12 ENCOUNTER — Other Ambulatory Visit: Payer: Self-pay | Admitting: Internal Medicine

## 2016-10-12 DIAGNOSIS — Z1231 Encounter for screening mammogram for malignant neoplasm of breast: Secondary | ICD-10-CM

## 2016-10-12 LAB — HM DIABETES EYE EXAM

## 2016-10-16 ENCOUNTER — Ambulatory Visit (AMBULATORY_SURGERY_CENTER): Payer: Self-pay | Admitting: *Deleted

## 2016-10-16 VITALS — Ht 66.0 in | Wt 188.8 lb

## 2016-10-16 DIAGNOSIS — Z86010 Personal history of colonic polyps: Secondary | ICD-10-CM

## 2016-10-16 MED ORDER — NA SULFATE-K SULFATE-MG SULF 17.5-3.13-1.6 GM/177ML PO SOLN: 1.0000 | Freq: Once | ORAL | 0 refills | Status: AC

## 2016-10-16 NOTE — Progress Notes (Signed)
Denies allergies to eggs or soy products. Denies complications with sedation or anesthesia. Denies O2 use. Denies use of diet or weight loss medications.  Emmi instructions given for colonoscopy.  

## 2016-10-26 ENCOUNTER — Ambulatory Visit
Admission: RE | Admit: 2016-10-26 | Discharge: 2016-10-26 | Disposition: A | Discharge status: Home / Self Care | Payer: Medicare Other | Source: Ambulatory Visit | Attending: Internal Medicine | Admitting: Internal Medicine

## 2016-10-26 DIAGNOSIS — Z1231 Encounter for screening mammogram for malignant neoplasm of breast: Secondary | ICD-10-CM

## 2016-10-27 LAB — HM MAMMOGRAPHY

## 2016-10-30 ENCOUNTER — Ambulatory Visit (AMBULATORY_SURGERY_CENTER): Payer: Medicare Other | Admitting: Gastroenterology

## 2016-10-30 ENCOUNTER — Encounter: Payer: Self-pay | Admitting: Gastroenterology

## 2016-10-30 VITALS — BP 121/67 | HR 80 | Temp 98.4°F | Resp 19 | Ht 66.0 in | Wt 188.0 lb

## 2016-10-30 DIAGNOSIS — Z886 Allergy status to analgesic agent status: Secondary | ICD-10-CM | POA: Diagnosis not present

## 2016-10-30 DIAGNOSIS — Z6830 Body mass index (BMI) 30.0-30.9, adult: Secondary | ICD-10-CM | POA: Diagnosis not present

## 2016-10-30 DIAGNOSIS — D120 Benign neoplasm of cecum: Secondary | ICD-10-CM | POA: Diagnosis not present

## 2016-10-30 DIAGNOSIS — Z86010 Personal history of colonic polyps: Secondary | ICD-10-CM

## 2016-10-30 DIAGNOSIS — I10 Essential (primary) hypertension: Secondary | ICD-10-CM | POA: Diagnosis not present

## 2016-10-30 DIAGNOSIS — K219 Gastro-esophageal reflux disease without esophagitis: Secondary | ICD-10-CM | POA: Diagnosis not present

## 2016-10-30 MED ORDER — SODIUM CHLORIDE 0.9 % IV SOLN: 500.0000 mL | INTRAVENOUS | Status: DC

## 2016-10-30 NOTE — Patient Instructions (Signed)
YOU HAD AN ENDOSCOPIC PROCEDURE TODAY AT THE [facility]:   Refer to the procedure report that was given to you for any specific questions about what was found during the examination.  If the procedure report does not answer your questions, please call your gastroenterologist to clarify.  If you requested that your care partner not be given the details of your procedure findings, then the procedure report has been included in a sealed envelope for you to review at your convenience later.  YOU SHOULD EXPECT: Some feelings of bloating in the abdomen. Passage of more gas than usual.  Walking can help get rid of the air that was put into your GI tract during the procedure and reduce the bloating. If you had a lower endoscopy (such as a colonoscopy or flexible sigmoidoscopy) you may notice spotting of blood in your stool or on the toilet paper. If you underwent a bowel prep for your procedure, you may not have a normal bowel movement for a few days.  Please Note:  You might notice some irritation and congestion in your nose or some drainage.  This is from the oxygen used during your procedure.  There is no need for concern and it should clear up in a day or so.  SYMPTOMS TO REPORT IMMEDIATELY:   Following lower endoscopy (colonoscopy or flexible sigmoidoscopy):  Excessive amounts of blood in the stool  Significant tenderness or worsening of abdominal pains  Swelling of the abdomen that is new, acute  Fever of 100F or higher   For urgent or emergent issues, a gastroenterologist can be reached at any hour by calling [phone].   DIET:  We do recommend a small meal at first, but then you may proceed to your regular diet.  Drink plenty of fluids but you should avoid alcoholic beverages for 24 hours. Try to increase the fiber in your diet, and drink plenty of water.  ACTIVITY:  You should plan to take it easy for the rest of today and you should NOT DRIVE or use heavy machinery until  tomorrow (because of the sedation medicines used during the test).    FOLLOW UP: Our staff will call the number listed on your records the next business day following your procedure to check on you and address any questions or concerns that you may have regarding the information given to you following your procedure. If we do not reach you, we will leave a message.  However, if you are feeling well and you are not experiencing any problems, there is no need to return our call.  We will assume that you have returned to your regular daily activities without incident.  If any biopsies were taken you will be contacted by phone or by letter within the next 1-3 weeks.  Please call us at [phone] if you have not heard about the biopsies in 3 weeks.    SIGNATURES/CONFIDENTIALITY: You and/or your care partner have signed paperwork which will be entered into your electronic medical record.  These signatures attest to the fact that that the information above on your After Visit Summary has been reviewed and is understood.  Full responsibility of the confidentiality of this discharge information lies with you and/or your care-partner.  Read all of the handouts given to you by your recovery room nurse. 

## 2016-10-30 NOTE — Progress Notes (Signed)
  [facility] Post-op Note  Patient: Alexandria [name]  Procedure(s) Performed: colonoscopy  Patient Location: [facility] - Recovery Area  Anesthesia Type: Deep Sedation/Propofol  Level of Consciousness: awake, oriented and patient cooperative  Airway and Oxygen Therapy: Patient Spontanous Breathing  Post-op Pain: none  Post-op Assessment:  Post-op Vital signs reviewed, Patient's Cardiovascular Status Stable, Respiratory Function Stable, Patent Airway, No signs of Nausea or vomiting and Pain level controlled  Post-op Vital Signs: Reviewed and stable  Complications: No apparent anesthesia complications  Taryne Kiger E Jailin Moomaw 8:41 AM

## 2016-10-30 NOTE — Progress Notes (Signed)
Called to room to assist during endoscopic procedure.  Patient ID and intended procedure confirmed with present staff. Received instructions for my participation in the procedure from the performing physician.  

## 2016-10-30 NOTE — Progress Notes (Signed)
Pt's states no medical or surgical changes since previsit or office visit. 

## 2016-10-30 NOTE — Op Note (Signed)
[place] [facility] Patient Name: Alexandria [name] Procedure Date: 10/30/2016 8:11 AM MRN: [id number] Endoscopist: Ladene Artist , MD Age: 77 Referring MD:  Date of Birth: [birth year] Gender: Female Account #: [id number] Procedure:                Colonoscopy Indications:              Surveillance: Personal history of adenomatous                            polyps on last colonoscopy 5 years ago Medicines:                Monitored Anesthesia Care Procedure:                Pre-Anesthesia Assessment:                           - Prior to the procedure, a History and Physical                            was performed, and patient medications and                            allergies were reviewed. The patient's tolerance of                            previous anesthesia was also reviewed. The risks                            and benefits of the procedure and the sedation                            options and risks were discussed with the patient.                            All questions were answered, and informed consent                            was obtained. Prior Anticoagulants: The patient has                            taken no previous anticoagulant or antiplatelet                            agents. ASA Grade Assessment: II - A patient with                            mild systemic disease. After reviewing the risks                            and benefits, the patient was deemed in                            satisfactory condition to undergo the procedure.  After obtaining informed consent, the colonoscope                            was passed under direct vision. Throughout the                            procedure, the patient's blood pressure, pulse, and                            oxygen saturations were monitored continuously. The                            Colonoscope was introduced through the anus and                            advanced to the the cecum,  identified by                            appendiceal orifice and ileocecal valve. The                            ileocecal valve, appendiceal orifice, and rectum                            were photographed. The quality of the bowel                            preparation was adequate after extensive lavage and                            suctioning. The patient tolerated the procedure                            well. The colonoscopy was somewhat difficult due to                            significant looping and a tortuous colon.                            Successful completion of the procedure was aided by                            changing the patient's position, using manual                            pressure, withdrawing and reinserting the scope and                            straightening and shortening the scope to obtain                            bowel loop reduction. Scope In: 8:13:06 AM Scope Out: 8:35:17 AM Scope Withdrawal Time: 0 hours 8 minutes 42 seconds  Total Procedure Duration: 0 hours  22 minutes 11 seconds  Findings:                 The perianal and digital rectal examinations were                            normal.                           A 6 mm polyp was found in the cecum. The polyp was                            sessile. The polyp was removed with a cold snare.                            Resection and retrieval were complete.                           The exam was otherwise without abnormality on                            direct and retroflexion views.                           Internal hemorrhoids were found during                            retroflexion. The hemorrhoids were small and Grade                            I (internal hemorrhoids that do not prolapse).                           Multiple medium-mouthed diverticula were found in                            the sigmoid colon, descending colon, splenic                            flexure, transverse  colon, hepatic flexure and                            ascending colon. There was narrowing of the colon                            in association with the diverticular opening. There                            was evidence of diverticular spasm. There was no                            evidence of diverticular bleeding. Complications:            No immediate complications. Estimated blood loss:  None. Estimated Blood Loss:     Estimated blood loss: none. Impression:               - One 6 mm polyp in the cecum, removed with a cold                            snare. Resected and retrieved.                           - The examination was otherwise normal on direct                            and retroflexion views.                           - Internal hemorrhoids.                           - Moderate diverticulosis in the sigmoid colon, in                            the descending colon, at the splenic flexure, in                            the transverse colon, at the hepatic flexure and in                            the ascending colon. Recommendation:           - Patient has a contact number available for                            emergencies. The signs and symptoms of potential                            delayed complications were discussed with the                            patient. Return to normal activities tomorrow.                            Written discharge instructions were provided to the                            patient.                           - Resume previous diet.                           - Continue present medications.                           - Await pathology results.                           - No repeat colonoscopy due  to age. Ladene Artist, MD 10/30/2016 8:40:54 AM This report has been signed electronically.

## 2016-11-02 ENCOUNTER — Telehealth: Payer: Self-pay

## 2016-11-02 LAB — HM COLONOSCOPY

## 2016-11-02 NOTE — Telephone Encounter (Signed)
  Follow up Call-  Call back number 10/30/2016  Post procedure Call Back phone  # [phone]  Permission to leave phone message Yes  Some recent data might be hidden     Patient questions:  Do you have a fever, pain , or abdominal swelling? No. Pain Score  0 *  Have you tolerated food without any problems? Yes.    Have you been able to return to your normal activities? Yes.    Do you have any questions about your discharge instructions: Diet   No. Medications  No. Follow up visit  No.  Do you have questions or concerns about your Care? No.  Actions: * If pain score is 4 or above: No action needed, pain <4.  Pt said to thank everyone for the wonderful care.  No problems noted per pt. maw

## 2016-11-05 DIAGNOSIS — H43813 Vitreous degeneration, bilateral: Secondary | ICD-10-CM | POA: Diagnosis not present

## 2016-11-05 DIAGNOSIS — H401131 Primary open-angle glaucoma, bilateral, mild stage: Secondary | ICD-10-CM | POA: Diagnosis not present

## 2016-11-05 DIAGNOSIS — H16223 Keratoconjunctivitis sicca, not specified as Sjogren's, bilateral: Secondary | ICD-10-CM | POA: Diagnosis not present

## 2016-11-08 ENCOUNTER — Encounter: Payer: Self-pay | Admitting: Gastroenterology

## 2016-11-10 LAB — HM COLONOSCOPY

## 2017-01-12 ENCOUNTER — Encounter: Payer: Self-pay | Admitting: Internal Medicine

## 2017-01-12 ENCOUNTER — Ambulatory Visit (INDEPENDENT_AMBULATORY_CARE_PROVIDER_SITE_OTHER): Payer: Medicare Other | Admitting: Internal Medicine

## 2017-01-12 ENCOUNTER — Other Ambulatory Visit (INDEPENDENT_AMBULATORY_CARE_PROVIDER_SITE_OTHER): Payer: Medicare Other

## 2017-01-12 VITALS — BP 124/72 | HR 71 | Temp 98.5°F | Resp 16 | Ht 66.0 in | Wt 193.0 lb

## 2017-01-12 DIAGNOSIS — E876 Hypokalemia: Secondary | ICD-10-CM

## 2017-01-12 DIAGNOSIS — M5136 Other intervertebral disc degeneration, lumbar region: Secondary | ICD-10-CM

## 2017-01-12 DIAGNOSIS — E669 Obesity, unspecified: Secondary | ICD-10-CM | POA: Diagnosis not present

## 2017-01-12 DIAGNOSIS — Z0000 Encounter for general adult medical examination without abnormal findings: Secondary | ICD-10-CM

## 2017-01-12 DIAGNOSIS — I10 Essential (primary) hypertension: Secondary | ICD-10-CM | POA: Diagnosis not present

## 2017-01-12 DIAGNOSIS — E118 Type 2 diabetes mellitus with unspecified complications: Secondary | ICD-10-CM | POA: Diagnosis not present

## 2017-01-12 DIAGNOSIS — D508 Other iron deficiency anemias: Secondary | ICD-10-CM | POA: Diagnosis not present

## 2017-01-12 DIAGNOSIS — M51369 Other intervertebral disc degeneration, lumbar region without mention of lumbar back pain or lower extremity pain: Secondary | ICD-10-CM

## 2017-01-12 DIAGNOSIS — E66811 Obesity, class 1: Secondary | ICD-10-CM

## 2017-01-12 DIAGNOSIS — Z23 Encounter for immunization: Secondary | ICD-10-CM

## 2017-01-12 LAB — LIPID PANEL
Cholesterol: 144 mg/dL (ref 0–200)
HDL: 58.1 mg/dL (ref >39.00)
LDL CALC: 74 mg/dL (ref 0–99)
NONHDL: 85.74
Total CHOL/HDL Ratio: 2
Triglycerides: 61 mg/dL (ref 0.0–149.0)
VLDL: 12.2 mg/dL (ref 0.0–40.0)

## 2017-01-12 LAB — COMPREHENSIVE METABOLIC PANEL
ALT: 10 U/L (ref 0–35)
AST: 12 U/L (ref 0–37)
Albumin: 3.9 g/dL (ref 3.5–5.2)
Alkaline Phosphatase: 50 U/L (ref 39–117)
BUN: 14 mg/dL (ref 6–23)
CO2: 27 meq/L (ref 19–32)
Calcium: 9.5 mg/dL (ref 8.4–10.5)
Chloride: 107 mEq/L (ref 96–112)
Creatinine, Ser: 0.9 mg/dL (ref 0.40–1.20)
GFR: 78.57 mL/min (ref >60.00)
GLUCOSE: 96 mg/dL (ref 70–99)
POTASSIUM: 4 meq/L (ref 3.5–5.1)
Sodium: 142 mEq/L (ref 135–145)
Total Bilirubin: 0.6 mg/dL (ref 0.2–1.2)
Total Protein: 7.1 g/dL (ref 6.0–8.3)

## 2017-01-12 LAB — URINALYSIS, ROUTINE W REFLEX MICROSCOPIC
Bilirubin Urine: NEGATIVE
Hgb urine dipstick: NEGATIVE
KETONES UR: NEGATIVE
Nitrite: NEGATIVE
PH: 6 (ref 5.0–8.0)
RBC / HPF: NONE SEEN (ref 0–?)
TOTAL PROTEIN, URINE-UPE24: NEGATIVE
UROBILINOGEN UA: 1 (ref 0.0–1.0)
Urine Glucose: NEGATIVE

## 2017-01-12 LAB — CBC WITH DIFFERENTIAL/PLATELET
BASOS ABS: 0 10*3/uL (ref 0.0–0.1)
Basophils Relative: 0.9 % (ref 0.0–3.0)
EOS PCT: 5.4 % — AB (ref 0.0–5.0)
Eosinophils Absolute: 0.2 10*3/uL (ref 0.0–0.7)
HEMATOCRIT: 34.2 % — AB (ref 36.0–46.0)
Hemoglobin: 11.1 g/dL — ABNORMAL LOW (ref 12.0–15.0)
LYMPHS PCT: 39.2 % (ref 12.0–46.0)
Lymphs Abs: 1.7 10*3/uL (ref 0.7–4.0)
MCHC: 32.6 g/dL (ref 30.0–36.0)
MCV: 87.1 fl (ref 78.0–100.0)
MONOS PCT: 9.7 % (ref 3.0–12.0)
Monocytes Absolute: 0.4 10*3/uL (ref 0.1–1.0)
NEUTROS ABS: 1.9 10*3/uL (ref 1.4–7.7)
Neutrophils Relative %: 44.8 % (ref 43.0–77.0)
Platelets: 251 10*3/uL (ref 150.0–400.0)
RBC: 3.92 Mil/uL (ref 3.87–5.11)
RDW: 14.9 % (ref 11.5–15.5)
WBC: 4.2 10*3/uL (ref 4.0–10.5)

## 2017-01-12 LAB — IBC PANEL
Iron: 49 ug/dL (ref 42–145)
Saturation Ratios: 15 % — ABNORMAL LOW (ref 20.0–50.0)
Transferrin: 233 mg/dL (ref 212.0–360.0)

## 2017-01-12 LAB — FERRITIN: Ferritin: 143.1 ng/mL (ref 10.0–291.0)

## 2017-01-12 LAB — HEMOGLOBIN A1C: HEMOGLOBIN A1C: 6.5 % (ref 4.6–6.5)

## 2017-01-12 MED ORDER — CYCLOBENZAPRINE HCL 10 MG PO TABS: 10.0000 mg | ORAL_TABLET | Freq: Every day | ORAL | 1 refills | Status: DC

## 2017-01-12 NOTE — Progress Notes (Signed)
Subjective:  Patient ID: Alexandria [name], female    DOB: 1940/10/02  Age: 76 y.o. MRN: [id number]  CC: Annual Exam; Hyperlipidemia; Diabetes; Hypertension; and Back Pain   HPI ZYIAH WITHINGTON presents for a CPX and f/up.  She complains of chronic aching in her back. The symptoms are most prominent at night while she is trying to sleep. She takes Motrin for some relief but she also previously took Flexeril which offered significant relief from her discomfort. She is requesting a refill. The pain does not radiate and she denies numbness, weakness, tingling or any other alarming symptoms. She otherwise feels well and offers no other complaints today.  Past Medical History:  Diagnosis Date  . Anemia, iron deficiency   . Cataracts, bilateral   . Chest pain, unspecified   . Colon polyp 09/2006   adenomatous  . Fibrocystic breast disease   . GERD (gastroesophageal reflux disease)   . Glaucoma, right eye   . Hyperlipidemia   . Hypertension   . LBP (low back pain)   . Osteoarthritis   . Osteopenia   . Seasonal allergies    Past Surgical History:  Procedure Laterality Date  . ABDOMINAL HYSTERECTOMY    . BREAST LUMPECTOMY     RIGHT 22 years ago  . CHOLECYSTECTOMY      reports that she has never smoked. She has never used smokeless tobacco. She reports that she does not drink alcohol or use drugs. family history includes Arthritis in her other; Hypertension in her other. Allergies  Allergen Reactions  . Oxycodone-Acetaminophen     REACTION: nausea and vomiting  . Tramadol Hcl     REACTION: nausea and vomiting    Outpatient Medications Prior to Visit  Medication Sig Dispense Refill  . aspirin 81 MG tablet Take 1 tablet (81 mg total) by mouth daily. 30 tablet 11  . atorvastatin (LIPITOR) 20 MG tablet Take 1 tablet (20 mg total) by mouth daily. 90 tablet 3  . Bimatoprost (LUMIGAN OP) Apply to eye.    . cyanocobalamin 2000 MCG tablet Take 1 tablet (2,000 mcg total) by mouth daily. 90  tablet 3  . Lifitegrast (XIIDRA OP) Apply to eye.    . meloxicam (MOBIC) 15 MG tablet Take 1 tablet (15 mg total) by mouth daily. 90 tablet 1  . potassium chloride SA (K-DUR,KLOR-CON) 20 MEQ tablet Take 1 tablet (20 mEq total) by mouth 2 (two) times daily. 60 tablet 5  . valsartan-hydrochlorothiazide (DIOVAN-HCT) 160-25 MG tablet Take 1 tablet by mouth daily. 90 tablet 3  . 0.9 %  sodium chloride infusion      No facility-administered medications prior to visit.     ROS Review of Systems  Constitutional: Negative.  Negative for chills, fatigue and fever.  HENT: Negative.  Negative for sore throat.   Eyes: Negative.  Negative for visual disturbance.  Respiratory: Negative.  Negative for cough, chest tightness, shortness of breath and wheezing.   Cardiovascular: Negative for chest pain, palpitations and leg swelling.  Gastrointestinal: Negative for abdominal pain, constipation, diarrhea, nausea and vomiting.  Endocrine: Negative.  Negative for polydipsia, polyphagia and polyuria.  Genitourinary: Negative.  Negative for decreased urine volume, difficulty urinating, dysuria, flank pain, frequency and urgency.  Musculoskeletal: Positive for back pain. Negative for arthralgias, myalgias and neck pain.  Skin: Negative.  Negative for color change.  Allergic/Immunologic: Negative.   Neurological: Negative.  Negative for dizziness, weakness, numbness and headaches.  Hematological: Negative for adenopathy. Does not bruise/bleed easily.  Psychiatric/Behavioral: Negative.     Objective:  BP 124/72 (BP Location: Left Arm, Patient Position: Sitting, Cuff Size: Normal)   Pulse 71   Temp 98.5 F (36.9 C) (Oral)   Resp 16   Ht 5\' 6"  (1.676 m)   Wt 193 lb (87.5 kg)   SpO2 97%   BMI 31.15 kg/m   BP Readings from Last 3 Encounters:  01/12/17 124/72  10/30/16 121/67  03/09/16 (!) 122/54    Wt Readings from Last 3 Encounters:  01/12/17 193 lb (87.5 kg)  10/30/16 188 lb (85.3 kg)  10/16/16  188 lb 12.8 oz (85.6 kg)    Physical Exam  Constitutional: She is oriented to person, place, and time. No distress.  HENT:  Mouth/Throat: Oropharynx is clear and moist. No oropharyngeal exudate.  Eyes: Conjunctivae are normal. Right eye exhibits no discharge. Left eye exhibits no discharge. No scleral icterus.  Neck: Normal range of motion. Neck supple. No JVD present. No thyromegaly present.  Cardiovascular: Normal rate, regular rhythm and intact distal pulses.  Exam reveals no gallop and no friction rub.   No murmur heard. Pulmonary/Chest: Effort normal and breath sounds normal. No respiratory distress. She has no wheezes. She has no rales. She exhibits no tenderness.  Abdominal: Soft. Bowel sounds are normal. She exhibits no distension and no mass. There is no tenderness. There is no rebound and no guarding.  Musculoskeletal: Normal range of motion. She exhibits no edema, tenderness or deformity.  Lymphadenopathy:    She has no cervical adenopathy.  Neurological: She is alert and oriented to person, place, and time.  Skin: Skin is warm and dry. No rash noted. She is not diaphoretic. No erythema. No pallor.  Psychiatric: She has a normal mood and affect. Her behavior is normal. Judgment and thought content normal.  Vitals reviewed.   Lab Results  Component Value Date   WBC 4.2 01/12/2017   HGB 11.1 (L) 01/12/2017   HCT 34.2 (L) 01/12/2017   PLT 251.0 01/12/2017   GLUCOSE 96 01/12/2017   CHOL 144 01/12/2017   TRIG 61.0 01/12/2017   HDL 58.10 01/12/2017   LDLCALC 74 01/12/2017   ALT 10 01/12/2017   AST 12 01/12/2017   NA 142 01/12/2017   K 4.0 01/12/2017   CL 107 01/12/2017   CREATININE 0.90 01/12/2017   BUN 14 01/12/2017   CO2 27 01/12/2017   TSH 1.92 01/09/2016   HGBA1C 6.5 01/12/2017   MICROALBUR 1.4 01/12/2017    Mm Screening Breast Tomo Bilateral  Result Date: 10/26/2016 CLINICAL DATA:  Screening. EXAM: 2D DIGITAL SCREENING BILATERAL MAMMOGRAM WITH CAD AND ADJUNCT  TOMO COMPARISON:  Previous exam(s). ACR Breast Density Category c: The breast tissue is heterogeneously dense, which may obscure small masses. FINDINGS: There are no findings suspicious for malignancy. Images were processed with CAD. IMPRESSION: No mammographic evidence of malignancy. A result letter of this screening mammogram will be mailed directly to the patient. RECOMMENDATION: Screening mammogram in one year. (Code:SM-B-01Y) BI-RADS CATEGORY  1: Negative. Electronically Signed   By: Nolon Nations M.D.   On: 10/26/2016 13:03    Assessment & Plan:   Jonika was seen today for annual exam, hyperlipidemia, diabetes, hypertension and back pain.  Diagnoses and all orders for this visit:  Essential hypertension- her blood pressure is well controlled. Electrolytes and renal function are normal. -     Comprehensive metabolic panel; Future -     Urinalysis, Routine w reflex microscopic; Future  Type 2 diabetes mellitus with  complication, without long-term current use of insulin ([place])- her A1c is 6.5%. Her blood sugars are adequately well controlled. -     Comprehensive metabolic panel; Future -     Lipid panel; Future -     Hemoglobin A1c; Future -     Microalbumin / creatinine urine ratio; Future  Hypokalemia- her potassium level is normal now. Will continue the current potassium replacement therapy. -     Comprehensive metabolic panel; Future  Other iron deficiency anemia- some improvement noted. Will continue iron replacement therapy. -     CBC with Differential/Platelet; Future -     IBC panel; Future -     Ferritin; Future  Obesity (BMI 30.0-34.9)- she is working on her lifestyle modifications to lose weight.  Need for influenza vaccination -     Flu vaccine HIGH DOSE PF (Fluzone High dose)  Degenerative disc disease, lumbar -     cyclobenzaprine (FLEXERIL) 10 MG tablet; Take 1 tablet (10 mg total) by mouth at bedtime.   I am having Ms. Dexheimer start on cyclobenzaprine. I am also  having her maintain her aspirin, meloxicam, valsartan-hydrochlorothiazide, atorvastatin, potassium chloride SA, cyanocobalamin, Bimatoprost (LUMIGAN OP), and Lifitegrast (XIIDRA OP). We will stop administering sodium chloride.  Meds ordered this encounter  Medications  . cyclobenzaprine (FLEXERIL) 10 MG tablet    Sig: Take 1 tablet (10 mg total) by mouth at bedtime.    Dispense:  90 tablet    Refill:  1   See AVS for instructions about healthy living and anticipatory guidance.  Follow-up: Return in about 6 months (around 07/13/2017).  Scarlette Calico, MD

## 2017-01-12 NOTE — Patient Instructions (Signed)

## 2017-01-13 ENCOUNTER — Encounter: Payer: Self-pay | Admitting: Internal Medicine

## 2017-01-13 LAB — MICROALBUMIN / CREATININE URINE RATIO
CREATININE, U: 263.5 mg/dL
MICROALB/CREAT RATIO: 0.5 mg/g (ref 0.0–30.0)
Microalb, Ur: 1.4 mg/dL (ref 0.0–1.9)

## 2017-01-15 NOTE — Assessment & Plan Note (Signed)

## 2017-02-24 ENCOUNTER — Telehealth: Payer: Self-pay | Admitting: Internal Medicine

## 2017-02-24 DIAGNOSIS — M159 Polyosteoarthritis, unspecified: Secondary | ICD-10-CM

## 2017-02-24 DIAGNOSIS — E785 Hyperlipidemia, unspecified: Secondary | ICD-10-CM

## 2017-02-24 DIAGNOSIS — M4710 Other spondylosis with myelopathy, site unspecified: Secondary | ICD-10-CM

## 2017-02-24 DIAGNOSIS — I10 Essential (primary) hypertension: Secondary | ICD-10-CM

## 2017-02-24 DIAGNOSIS — M150 Primary generalized (osteo)arthritis: Secondary | ICD-10-CM

## 2017-02-24 DIAGNOSIS — E876 Hypokalemia: Secondary | ICD-10-CM

## 2017-02-24 DIAGNOSIS — M5136 Other intervertebral disc degeneration, lumbar region: Secondary | ICD-10-CM

## 2017-02-24 MED ORDER — VALSARTAN-HYDROCHLOROTHIAZIDE 160-25 MG PO TABS: 1.0000 | ORAL_TABLET | Freq: Every day | ORAL | 3 refills | Status: DC

## 2017-02-24 MED ORDER — MELOXICAM 15 MG PO TABS: 15.0000 mg | ORAL_TABLET | Freq: Every day | ORAL | 1 refills | Status: DC

## 2017-02-24 MED ORDER — ATORVASTATIN CALCIUM 20 MG PO TABS: 20.0000 mg | ORAL_TABLET | Freq: Every day | ORAL | 3 refills | Status: DC

## 2017-02-24 MED ORDER — CYCLOBENZAPRINE HCL 10 MG PO TABS: 10.0000 mg | ORAL_TABLET | Freq: Every day | ORAL | 1 refills | Status: DC

## 2017-02-24 MED ORDER — POTASSIUM CHLORIDE CRYS ER 20 MEQ PO TBCR: 20.0000 meq | EXTENDED_RELEASE_TABLET | Freq: Two times a day (BID) | ORAL | 3 refills | Status: DC

## 2017-02-24 MED ORDER — LIFITEGRAST 5 % OP SOLN
1.0000 [drp] | Freq: Every day | OPHTHALMIC | 0 refills | Status: DC
Start: 2017-02-24 — End: 2019-10-21

## 2017-02-24 NOTE — Telephone Encounter (Signed)
Change in pharmacy- request fax Rx to [facility]

## 2017-02-24 NOTE — Telephone Encounter (Signed)
rf request for Xiidra OP, meloxicam and cyclobenzaprine to be sent to [facility] mail order? Please advise.   erx for valsartan-hctz, atorvastatin, and potassium sent to [facility] mail order.

## 2017-02-24 NOTE — Telephone Encounter (Signed)
See message below °

## 2017-02-24 NOTE — Telephone Encounter (Signed)
Copied from [place] [id number]. Topic: Quick Communication - See Telephone Encounter >> Feb 24, 2017 10:04 AM Burnis Medin, NT wrote:  CRM for notification. See Telephone encounter for: Pt. Called and wanted to see if her medication can be faxed to the [place]. Fax number is [phone]. Pt needs all of her medications that she is taking faxed there. valsartan-hydrochlorothiazide (DIOVAN-HCT) 160-25 MG tablet,  potassium chloride SA (K-DUR,KLOR-CON) 20 MEQ tablet, meloxicam (MOBIC) 15 MG tablet, Lifitegrast (XIIDRA OP, cyclobenzaprine (FLEXERIL) 10 MG tablet, cyanocobalamin 2000 MCG tablet, Bimatoprost (LUMIGAN OP, atorvastatin (LIPITOR) 20 MG tablet,

## 2017-02-24 NOTE — Telephone Encounter (Signed)
All sent as requested.

## 2017-02-24 NOTE — Telephone Encounter (Signed)
Yes, to all requested

## 2017-05-05 LAB — HM DIABETES EYE EXAM

## 2017-05-11 DIAGNOSIS — H401131 Primary open-angle glaucoma, bilateral, mild stage: Secondary | ICD-10-CM | POA: Diagnosis not present

## 2017-05-27 ENCOUNTER — Ambulatory Visit (INDEPENDENT_AMBULATORY_CARE_PROVIDER_SITE_OTHER): Payer: Medicare Other | Admitting: Internal Medicine

## 2017-05-27 ENCOUNTER — Encounter: Payer: Self-pay | Admitting: Internal Medicine

## 2017-05-27 ENCOUNTER — Other Ambulatory Visit (INDEPENDENT_AMBULATORY_CARE_PROVIDER_SITE_OTHER): Payer: Medicare Other

## 2017-05-27 VITALS — BP 134/60 | HR 90 | Temp 98.0°F | Resp 16 | Ht 66.0 in | Wt 191.0 lb

## 2017-05-27 DIAGNOSIS — E118 Type 2 diabetes mellitus with unspecified complications: Secondary | ICD-10-CM

## 2017-05-27 DIAGNOSIS — E559 Vitamin D deficiency, unspecified: Secondary | ICD-10-CM

## 2017-05-27 DIAGNOSIS — R51 Headache: Secondary | ICD-10-CM

## 2017-05-27 DIAGNOSIS — D508 Other iron deficiency anemias: Secondary | ICD-10-CM

## 2017-05-27 DIAGNOSIS — I10 Essential (primary) hypertension: Secondary | ICD-10-CM

## 2017-05-27 DIAGNOSIS — R519 Headache, unspecified: Secondary | ICD-10-CM

## 2017-05-27 LAB — BASIC METABOLIC PANEL
BUN: 13 mg/dL (ref 6–23)
CHLORIDE: 103 meq/L (ref 96–112)
CO2: 30 meq/L (ref 19–32)
CREATININE: 0.87 mg/dL (ref 0.40–1.20)
Calcium: 9.8 mg/dL (ref 8.4–10.5)
GFR: 81.63 mL/min (ref >60.00)
Glucose, Bld: 94 mg/dL (ref 70–99)
Potassium: 3.6 mEq/L (ref 3.5–5.1)
Sodium: 140 mEq/L (ref 135–145)

## 2017-05-27 LAB — CBC WITH DIFFERENTIAL/PLATELET
BASOS PCT: 0.9 % (ref 0.0–3.0)
Basophils Absolute: 0 10*3/uL (ref 0.0–0.1)
EOS ABS: 0.2 10*3/uL (ref 0.0–0.7)
Eosinophils Relative: 4.4 % (ref 0.0–5.0)
HEMATOCRIT: 37 % (ref 36.0–46.0)
Hemoglobin: 12.1 g/dL (ref 12.0–15.0)
LYMPHS PCT: 37 % (ref 12.0–46.0)
Lymphs Abs: 1.5 10*3/uL (ref 0.7–4.0)
MCHC: 32.7 g/dL (ref 30.0–36.0)
MCV: 86.1 fl (ref 78.0–100.0)
Monocytes Absolute: 0.4 10*3/uL (ref 0.1–1.0)
Monocytes Relative: 9.4 % (ref 3.0–12.0)
NEUTROS ABS: 2 10*3/uL (ref 1.4–7.7)
Neutrophils Relative %: 48.3 % (ref 43.0–77.0)
PLATELETS: 281 10*3/uL (ref 150.0–400.0)
RBC: 4.3 Mil/uL (ref 3.87–5.11)
RDW: 15.1 % (ref 11.5–15.5)
WBC: 4 10*3/uL (ref 4.0–10.5)

## 2017-05-27 LAB — HEMOGLOBIN A1C: HEMOGLOBIN A1C: 6.4 % (ref 4.6–6.5)

## 2017-05-27 LAB — SEDIMENTATION RATE: Sed Rate: 32 mm/hr — ABNORMAL HIGH (ref 0–30)

## 2017-05-27 NOTE — Progress Notes (Signed)
Subjective:  Patient ID: Alexandria [name], female    DOB: Aug 31, 1940  Age: 77 y.o. MRN: [id number]  CC: Headache   HPI Alexandria [name] presents for concerns about a several month history of headache that she describes as an intermittent stabbing sensation around the right parietal, top of her head that radiates into her right eye.  The headache increases at night and interferes with her sleep.  She takes Motrin and gets some symptom relief.  She also complains of numbness in her right hand.  She was concerned that it might have been related to her vision so she recently saw her eye doctor and was told that her eye exam was normal.  Outpatient Medications Prior to Visit  Medication Sig Dispense Refill  . aspirin 81 MG tablet Take 1 tablet (81 mg total) by mouth daily. 30 tablet 11  . atorvastatin (LIPITOR) 20 MG tablet Take 1 tablet (20 mg total) by mouth daily. 90 tablet 3  . Bimatoprost (LUMIGAN OP) Apply to eye.    . cyanocobalamin 2000 MCG tablet Take 1 tablet (2,000 mcg total) by mouth daily. 90 tablet 3  . cyclobenzaprine (FLEXERIL) 10 MG tablet Take 1 tablet (10 mg total) by mouth at bedtime. 90 tablet 1  . Lifitegrast (XIIDRA) 5 % SOLN Apply 1 drop to eye daily. Each eye. 5 each 0  . meloxicam (MOBIC) 15 MG tablet Take 1 tablet (15 mg total) by mouth daily. 90 tablet 1  . potassium chloride SA (K-DUR,KLOR-CON) 20 MEQ tablet Take 1 tablet (20 mEq total) by mouth 2 (two) times daily. 180 tablet 3  . valsartan-hydrochlorothiazide (DIOVAN-HCT) 160-25 MG tablet Take 1 tablet by mouth daily. 90 tablet 3   No facility-administered medications prior to visit.     ROS Review of Systems  Constitutional: Negative.  Negative for appetite change, diaphoresis, fatigue and unexpected weight change.  HENT: Negative.  Negative for trouble swallowing.   Eyes: Negative for photophobia, pain, redness and visual disturbance.  Respiratory: Negative.  Negative for cough, chest tightness, shortness of  breath and wheezing.   Cardiovascular: Negative for chest pain, palpitations and leg swelling.  Gastrointestinal: Negative for abdominal pain, constipation, diarrhea, nausea and vomiting.  Endocrine: Negative.   Genitourinary: Negative.  Negative for difficulty urinating.  Musculoskeletal: Negative.  Negative for arthralgias, myalgias and neck pain.  Skin: Negative.  Negative for color change, pallor and rash.  Allergic/Immunologic: Negative.   Neurological: Positive for numbness and headaches. Negative for dizziness, weakness and light-headedness.  Hematological: Negative for adenopathy. Does not bruise/bleed easily.  Psychiatric/Behavioral: Positive for sleep disturbance. Negative for agitation, decreased concentration and dysphoric mood. The patient is not nervous/anxious.     Objective:  BP 134/60 (BP Location: Left Arm, Patient Position: Sitting, Cuff Size: Small)   Pulse 90   Temp 98 F (36.7 C) (Oral)   Resp 16   Ht 5\' 6"  (1.676 m)   Wt 191 lb 0.2 oz (86.6 kg)   SpO2 97%   BMI 30.83 kg/m   BP Readings from Last 3 Encounters:  05/27/17 134/60  01/12/17 124/72  10/30/16 121/67    Wt Readings from Last 3 Encounters:  05/27/17 191 lb 0.2 oz (86.6 kg)  01/12/17 193 lb (87.5 kg)  10/30/16 188 lb (85.3 kg)    Physical Exam  Constitutional: She is oriented to person, place, and time. No distress.  HENT:  Head: Normocephalic.  Mouth/Throat: Oropharynx is clear and moist. No oropharyngeal exudate.  Eyes: Conjunctivae and EOM  are normal. Pupils are equal, round, and reactive to light. Left eye exhibits no discharge. Right conjunctiva is not injected. Right conjunctiva has no hemorrhage. Left conjunctiva is not injected. Left conjunctiva has no hemorrhage. No scleral icterus. Right eye exhibits normal extraocular motion and no nystagmus. Left eye exhibits normal extraocular motion and no nystagmus.  Neck: Normal range of motion. Neck supple. No JVD present. No thyromegaly  present.  Cardiovascular: Normal rate, regular rhythm and normal heart sounds. Exam reveals no gallop.  No murmur heard. Pulmonary/Chest: Effort normal and breath sounds normal. No respiratory distress. She has no wheezes. She has no rales.  Abdominal: Soft. Bowel sounds are normal. She exhibits no distension and no mass. There is no tenderness. There is no guarding.  Musculoskeletal: Normal range of motion. She exhibits no edema, tenderness or deformity.  Lymphadenopathy:    She has no cervical adenopathy.  Neurological: She is alert and oriented to person, place, and time. She has normal strength. She displays no atrophy, no tremor and normal reflexes. No cranial nerve deficit or sensory deficit. She exhibits normal muscle tone. She displays a negative Romberg sign. She displays no seizure activity. Coordination and gait normal. She displays no Babinski's sign on the right side. She displays no Babinski's sign on the left side.  Reflex Scores:      Tricep reflexes are 0 on the right side and 0 on the left side.      Bicep reflexes are 0 on the right side and 0 on the left side.      Brachioradialis reflexes are 0 on the right side and 0 on the left side.      Patellar reflexes are 0 on the right side and 0 on the left side.      Achilles reflexes are 0 on the right side and 0 on the left side. Skin: Skin is warm and dry. No rash noted. She is not diaphoretic. No erythema. No pallor.  Psychiatric: She has a normal mood and affect. Her behavior is normal. Judgment and thought content normal.  Vitals reviewed.   Lab Results  Component Value Date   WBC 4.0 05/27/2017   HGB 12.1 05/27/2017   HCT 37.0 05/27/2017   PLT 281.0 05/27/2017   GLUCOSE 94 05/27/2017   CHOL 144 01/12/2017   TRIG 61.0 01/12/2017   HDL 58.10 01/12/2017   LDLCALC 74 01/12/2017   ALT 10 01/12/2017   AST 12 01/12/2017   NA 140 05/27/2017   K 3.6 05/27/2017   CL 103 05/27/2017   CREATININE 0.87 05/27/2017   BUN 13  05/27/2017   CO2 30 05/27/2017   TSH 1.92 01/09/2016   HGBA1C 6.4 05/27/2017   MICROALBUR 1.4 01/12/2017    Mm Screening Breast Tomo Bilateral  Result Date: 10/26/2016 CLINICAL DATA:  Screening. EXAM: 2D DIGITAL SCREENING BILATERAL MAMMOGRAM WITH CAD AND ADJUNCT TOMO COMPARISON:  Previous exam(s). ACR Breast Density Category c: The breast tissue is heterogeneously dense, which may obscure small masses. FINDINGS: There are no findings suspicious for malignancy. Images were processed with CAD. IMPRESSION: No mammographic evidence of malignancy. A result letter of this screening mammogram will be mailed directly to the patient. RECOMMENDATION: Screening mammogram in one year. (Code:SM-B-01Y) BI-RADS CATEGORY  1: Negative. Electronically Signed   By: Nolon Nations M.D.   On: 10/26/2016 13:03    Assessment & Plan:   Aviv was seen today for headache.  Diagnoses and all orders for this visit:  Intractable episodic headache, unspecified  headache type- She has a new onset headache with a nocturnal component noted which is concerning for a CNS mass or bleed.  I do not see any acute pathology at this time.  Her sed rate is not elevated which is reassuring that she does not have vasculitis or temporal arteritis.  I have ordered an MRI without contrast. -     Sedimentation rate; Future -     MR Brain Wo Contrast; Future  Other iron deficiency anemia- Her H&H are normal. -     CBC with Differential/Platelet; Future  Type 2 diabetes mellitus with complication, without long-term current use of insulin ([place])- Her A1c is down to 6.4%.  Her blood sugars are adequately well controlled. -     Basic metabolic panel; Future -     Hemoglobin A1c; Future  Essential hypertension- Her blood pressure is well controlled. Electrolytes and renal function are normal. -     Basic metabolic panel; Future  Vitamin D deficiency- I will recheck her vitamin D level and will recommend replacement therapy if  indicated. -     VITAMIN D 25 Hydroxy (Vit-D Deficiency, Fractures); Future   I am having Laurie O. Ninneman maintain her aspirin, cyanocobalamin, Bimatoprost (LUMIGAN OP), atorvastatin, potassium chloride SA, valsartan-hydrochlorothiazide, meloxicam, cyclobenzaprine, and Lifitegrast.  No orders of the defined types were placed in this encounter.    Follow-up: Return in about 3 weeks (around 06/17/2017).  Scarlette Calico, MD

## 2017-05-27 NOTE — Patient Instructions (Signed)

## 2017-06-10 ENCOUNTER — Ambulatory Visit
Admission: RE | Admit: 2017-06-10 | Discharge: 2017-06-10 | Disposition: A | Discharge status: Home / Self Care | Payer: Medicare Other | Source: Ambulatory Visit | Attending: Internal Medicine | Admitting: Internal Medicine

## 2017-06-10 DIAGNOSIS — R519 Headache, unspecified: Secondary | ICD-10-CM

## 2017-06-10 DIAGNOSIS — R51 Headache: Principal | ICD-10-CM

## 2017-08-10 DIAGNOSIS — H43813 Vitreous degeneration, bilateral: Secondary | ICD-10-CM | POA: Diagnosis not present

## 2017-08-10 DIAGNOSIS — H401131 Primary open-angle glaucoma, bilateral, mild stage: Secondary | ICD-10-CM | POA: Diagnosis not present

## 2017-08-10 DIAGNOSIS — H16223 Keratoconjunctivitis sicca, not specified as Sjogren's, bilateral: Secondary | ICD-10-CM | POA: Diagnosis not present

## 2017-09-14 ENCOUNTER — Telehealth: Payer: Self-pay | Admitting: Emergency Medicine

## 2017-09-14 NOTE — Telephone Encounter (Signed)
Called patient to schedule AWV. Patient will call back to schedule at a later date.

## 2017-11-11 DIAGNOSIS — H401131 Primary open-angle glaucoma, bilateral, mild stage: Secondary | ICD-10-CM | POA: Diagnosis not present

## 2017-11-11 DIAGNOSIS — H43813 Vitreous degeneration, bilateral: Secondary | ICD-10-CM | POA: Diagnosis not present

## 2017-11-11 DIAGNOSIS — H16223 Keratoconjunctivitis sicca, not specified as Sjogren's, bilateral: Secondary | ICD-10-CM | POA: Diagnosis not present

## 2017-11-26 DIAGNOSIS — H401131 Primary open-angle glaucoma, bilateral, mild stage: Secondary | ICD-10-CM | POA: Diagnosis not present

## 2017-12-03 ENCOUNTER — Other Ambulatory Visit: Payer: Self-pay | Admitting: Internal Medicine

## 2017-12-03 DIAGNOSIS — Z1231 Encounter for screening mammogram for malignant neoplasm of breast: Secondary | ICD-10-CM

## 2017-12-15 DIAGNOSIS — H401131 Primary open-angle glaucoma, bilateral, mild stage: Secondary | ICD-10-CM | POA: Diagnosis not present

## 2017-12-29 ENCOUNTER — Ambulatory Visit
Admission: RE | Admit: 2017-12-29 | Discharge: 2017-12-29 | Disposition: A | Discharge status: Home / Self Care | Payer: Medicare Other | Source: Ambulatory Visit | Attending: Internal Medicine | Admitting: Internal Medicine

## 2017-12-29 DIAGNOSIS — Z1231 Encounter for screening mammogram for malignant neoplasm of breast: Secondary | ICD-10-CM

## 2017-12-30 LAB — HM MAMMOGRAPHY

## 2018-02-18 ENCOUNTER — Telehealth: Payer: Self-pay | Admitting: Internal Medicine

## 2018-02-18 DIAGNOSIS — I10 Essential (primary) hypertension: Secondary | ICD-10-CM

## 2018-02-18 DIAGNOSIS — E876 Hypokalemia: Secondary | ICD-10-CM

## 2018-02-18 DIAGNOSIS — E785 Hyperlipidemia, unspecified: Secondary | ICD-10-CM

## 2018-02-18 MED ORDER — POTASSIUM CHLORIDE CRYS ER 20 MEQ PO TBCR: 20.0000 meq | EXTENDED_RELEASE_TABLET | Freq: Two times a day (BID) | ORAL | 0 refills | Status: DC

## 2018-02-18 MED ORDER — ATORVASTATIN CALCIUM 20 MG PO TABS: 20.0000 mg | ORAL_TABLET | Freq: Every day | ORAL | 0 refills | Status: DC

## 2018-02-18 MED ORDER — VALSARTAN-HYDROCHLOROTHIAZIDE 160-25 MG PO TABS: 1.0000 | ORAL_TABLET | Freq: Every day | ORAL | 0 refills | Status: DC

## 2018-02-18 NOTE — Telephone Encounter (Signed)
Called patient and informed. She will make appointment at another time.

## 2018-02-18 NOTE — Telephone Encounter (Signed)
Copied from [place] [id number]. Topic: Quick Communication - See Telephone Encounter >> Feb 18, 2018  9:35 AM Conception Chancy, NT wrote: CRM for notification. See Telephone encounter for: 02/18/18.  Patient is calling and states she gets all her prescriptions from the [place] and they are requesting them to all be updated. Patient states they are needing refills on them all but is unsure of the name of them.  [facility] - [place], [place] - [address] [place] [address] Phone: [phone] Fax: [phone]

## 2018-02-18 NOTE — Telephone Encounter (Signed)
Pt is due for follow up. Can you contact pt to schedule? Sent in a 90 day only.

## 2018-02-19 ENCOUNTER — Ambulatory Visit (INDEPENDENT_AMBULATORY_CARE_PROVIDER_SITE_OTHER): Payer: Medicare Other

## 2018-02-19 DIAGNOSIS — Z23 Encounter for immunization: Secondary | ICD-10-CM

## 2018-03-09 ENCOUNTER — Ambulatory Visit (INDEPENDENT_AMBULATORY_CARE_PROVIDER_SITE_OTHER): Payer: Medicare Other | Admitting: Internal Medicine

## 2018-03-09 ENCOUNTER — Encounter: Payer: Self-pay | Admitting: Internal Medicine

## 2018-03-09 ENCOUNTER — Other Ambulatory Visit (INDEPENDENT_AMBULATORY_CARE_PROVIDER_SITE_OTHER): Payer: Medicare Other

## 2018-03-09 VITALS — BP 140/70 | HR 83 | Temp 98.0°F | Resp 16 | Ht 66.0 in | Wt 198.0 lb

## 2018-03-09 DIAGNOSIS — D539 Nutritional anemia, unspecified: Secondary | ICD-10-CM | POA: Diagnosis not present

## 2018-03-09 DIAGNOSIS — M150 Primary generalized (osteo)arthritis: Secondary | ICD-10-CM | POA: Diagnosis not present

## 2018-03-09 DIAGNOSIS — M4710 Other spondylosis with myelopathy, site unspecified: Secondary | ICD-10-CM | POA: Diagnosis not present

## 2018-03-09 DIAGNOSIS — D508 Other iron deficiency anemias: Secondary | ICD-10-CM

## 2018-03-09 DIAGNOSIS — E785 Hyperlipidemia, unspecified: Secondary | ICD-10-CM

## 2018-03-09 DIAGNOSIS — E118 Type 2 diabetes mellitus with unspecified complications: Secondary | ICD-10-CM

## 2018-03-09 DIAGNOSIS — Z0000 Encounter for general adult medical examination without abnormal findings: Secondary | ICD-10-CM | POA: Diagnosis not present

## 2018-03-09 DIAGNOSIS — M159 Polyosteoarthritis, unspecified: Secondary | ICD-10-CM

## 2018-03-09 DIAGNOSIS — M5136 Other intervertebral disc degeneration, lumbar region: Secondary | ICD-10-CM | POA: Diagnosis not present

## 2018-03-09 DIAGNOSIS — E876 Hypokalemia: Secondary | ICD-10-CM | POA: Diagnosis not present

## 2018-03-09 DIAGNOSIS — I10 Essential (primary) hypertension: Secondary | ICD-10-CM

## 2018-03-09 LAB — MICROALBUMIN / CREATININE URINE RATIO
Creatinine,U: 66.7 mg/dL
Microalb Creat Ratio: 1 mg/g (ref 0.0–30.0)
Microalb, Ur: <0.7 mg/dL (ref 0.0–1.9)

## 2018-03-09 LAB — CBC WITH DIFFERENTIAL/PLATELET
Basophils Absolute: 0 10*3/uL (ref 0.0–0.1)
Basophils Relative: 0.6 % (ref 0.0–3.0)
Eosinophils Absolute: 0.1 10*3/uL (ref 0.0–0.7)
Eosinophils Relative: 3.2 % (ref 0.0–5.0)
HEMATOCRIT: 34.8 % — AB (ref 36.0–46.0)
Hemoglobin: 11.5 g/dL — ABNORMAL LOW (ref 12.0–15.0)
Lymphocytes Relative: 37 % (ref 12.0–46.0)
Lymphs Abs: 1.7 10*3/uL (ref 0.7–4.0)
MCHC: 33 g/dL (ref 30.0–36.0)
MCV: 85.7 fl (ref 78.0–100.0)
Monocytes Absolute: 0.3 10*3/uL (ref 0.1–1.0)
Monocytes Relative: 7.3 % (ref 3.0–12.0)
NEUTROS ABS: 2.3 10*3/uL (ref 1.4–7.7)
Neutrophils Relative %: 51.9 % (ref 43.0–77.0)
Platelets: 261 10*3/uL (ref 150.0–400.0)
RBC: 4.07 Mil/uL (ref 3.87–5.11)
RDW: 15.3 % (ref 11.5–15.5)
WBC: 4.5 10*3/uL (ref 4.0–10.5)

## 2018-03-09 LAB — LIPID PANEL
Cholesterol: 153 mg/dL (ref 0–200)
HDL: 58.7 mg/dL (ref >39.00)
LDL CALC: 70 mg/dL (ref 0–99)
NonHDL: 93.89
TRIGLYCERIDES: 118 mg/dL (ref 0.0–149.0)
Total CHOL/HDL Ratio: 3
VLDL: 23.6 mg/dL (ref 0.0–40.0)

## 2018-03-09 LAB — BASIC METABOLIC PANEL
BUN: 17 mg/dL (ref 6–23)
CHLORIDE: 105 meq/L (ref 96–112)
CO2: 31 mEq/L (ref 19–32)
Calcium: 9 mg/dL (ref 8.4–10.5)
Creatinine, Ser: 0.89 mg/dL (ref 0.40–1.20)
GFR: 79.35 mL/min (ref >60.00)
Glucose, Bld: 129 mg/dL — ABNORMAL HIGH (ref 70–99)
Potassium: 3.1 mEq/L — ABNORMAL LOW (ref 3.5–5.1)
Sodium: 142 mEq/L (ref 135–145)

## 2018-03-09 LAB — URINALYSIS, ROUTINE W REFLEX MICROSCOPIC
Bilirubin Urine: NEGATIVE
Hgb urine dipstick: NEGATIVE
Ketones, ur: NEGATIVE
Leukocytes, UA: NEGATIVE
Nitrite: NEGATIVE
RBC / HPF: NONE SEEN (ref 0–?)
Specific Gravity, Urine: 1.02 (ref 1.000–1.030)
TOTAL PROTEIN, URINE-UPE24: NEGATIVE
Urine Glucose: NEGATIVE
Urobilinogen, UA: 0.2 (ref 0.0–1.0)
pH: 5 (ref 5.0–8.0)

## 2018-03-09 LAB — POCT GLYCOSYLATED HEMOGLOBIN (HGB A1C): Hemoglobin A1C: 5.8 % — AB (ref 4.0–5.6)

## 2018-03-09 MED ORDER — POTASSIUM CHLORIDE CRYS ER 20 MEQ PO TBCR: 20.0000 meq | EXTENDED_RELEASE_TABLET | Freq: Two times a day (BID) | ORAL | 1 refills | Status: DC

## 2018-03-09 MED ORDER — ATORVASTATIN CALCIUM 20 MG PO TABS: 20.0000 mg | ORAL_TABLET | Freq: Every day | ORAL | 1 refills | Status: DC

## 2018-03-09 MED ORDER — VALSARTAN-HYDROCHLOROTHIAZIDE 160-25 MG PO TABS: 1.0000 | ORAL_TABLET | Freq: Every day | ORAL | 1 refills | Status: DC

## 2018-03-09 MED ORDER — MELOXICAM 15 MG PO TABS: 15.0000 mg | ORAL_TABLET | Freq: Every day | ORAL | 1 refills | Status: DC

## 2018-03-09 NOTE — Patient Instructions (Signed)

## 2018-03-09 NOTE — Progress Notes (Signed)
Subjective:  Patient ID: Alexandria [name], female    DOB: Sep 29, 1940  Age: 77 y.o. MRN: [id number]  CC: Hypertension; Hyperlipidemia; and Diabetes   HPI [name] presents for a CPX and f/up - She does not monitor her blood pressure or her blood sugars.  She feels well today and offers no complaints.  Past Medical History:  Diagnosis Date  . Anemia, iron deficiency   . Cataracts, bilateral   . Chest pain, unspecified   . Colon polyp 09/2006   adenomatous  . Fibrocystic breast disease   . GERD (gastroesophageal reflux disease)   . Glaucoma, right eye   . Hyperlipidemia   . Hypertension   . LBP (low back pain)   . Osteoarthritis   . Osteopenia   . Seasonal allergies    Past Surgical History:  Procedure Laterality Date  . ABDOMINAL HYSTERECTOMY    . BREAST LUMPECTOMY     RIGHT 22 years ago  . CHOLECYSTECTOMY      reports that she has never smoked. She has never used smokeless tobacco. She reports that she does not drink alcohol or use drugs. family history includes Arthritis in an other family member; Hypertension in an other family member. Allergies  Allergen Reactions  . Oxycodone-Acetaminophen     REACTION: nausea and vomiting  . Tramadol Hcl     REACTION: nausea and vomiting    Outpatient Medications Prior to Visit  Medication Sig Dispense Refill  . Bimatoprost (LUMIGAN OP) Apply to eye.    . cyanocobalamin 2000 MCG tablet Take 1 tablet (2,000 mcg total) by mouth daily. 90 tablet 3  . Lifitegrast (XIIDRA) 5 % SOLN Apply 1 drop to eye daily. Each eye. 5 each 0  . aspirin 81 MG tablet Take 1 tablet (81 mg total) by mouth daily. 30 tablet 11  . atorvastatin (LIPITOR) 20 MG tablet Take 1 tablet (20 mg total) by mouth daily. 90 tablet 0  . cyclobenzaprine (FLEXERIL) 10 MG tablet Take 1 tablet (10 mg total) by mouth at bedtime. 90 tablet 1  . meloxicam (MOBIC) 15 MG tablet Take 1 tablet (15 mg total) by mouth daily. 90 tablet 1  . potassium chloride SA  (K-DUR,KLOR-CON) 20 MEQ tablet Take 1 tablet (20 mEq total) by mouth 2 (two) times daily. 180 tablet 0  . valsartan-hydrochlorothiazide (DIOVAN-HCT) 160-25 MG tablet Take 1 tablet by mouth daily. 90 tablet 0   No facility-administered medications prior to visit.     ROS Review of Systems  Constitutional: Negative for diaphoresis, fatigue and unexpected weight change.  HENT: Negative.   Eyes: Negative for visual disturbance.  Respiratory: Negative for cough, chest tightness, shortness of breath and wheezing.   Cardiovascular: Negative for chest pain, palpitations and leg swelling.  Gastrointestinal: Negative for abdominal pain, constipation, diarrhea and vomiting.  Endocrine: Negative for polydipsia, polyphagia and polyuria.  Genitourinary: Negative.  Negative for difficulty urinating and dysuria.  Musculoskeletal: Positive for arthralgias and back pain. Negative for myalgias.  Skin: Negative.   Neurological: Negative.  Negative for dizziness, weakness, light-headedness and numbness.  Hematological: Negative for adenopathy. Does not bruise/bleed easily.  Psychiatric/Behavioral: Negative.     Objective:  BP 140/70 (BP Location: Left Arm, Patient Position: Sitting, Cuff Size: Large)   Pulse 83   Temp 98 F (36.7 C) (Oral)   Resp 16   Ht 5\' 6"  (1.676 m)   Wt 198 lb (89.8 kg)   SpO2 99%   BMI 31.96 kg/m   BP Readings  from Last 3 Encounters:  03/09/18 140/70  05/27/17 134/60  01/12/17 124/72    Wt Readings from Last 3 Encounters:  03/09/18 198 lb (89.8 kg)  05/27/17 191 lb 0.2 oz (86.6 kg)  01/12/17 193 lb (87.5 kg)    Physical Exam Vitals signs reviewed.  Constitutional:      Appearance: She is not ill-appearing or diaphoretic.  HENT:     Mouth/Throat:     Mouth: Mucous membranes are moist.  Neck:     Musculoskeletal: Normal range of motion and neck supple.  Cardiovascular:     Rate and Rhythm: Normal rate and regular rhythm.     Heart sounds: Normal heart sounds.  No murmur. No gallop.   Pulmonary:     Effort: Pulmonary effort is normal.     Breath sounds: Normal breath sounds. No stridor. No wheezing or rhonchi.  Abdominal:     General: Abdomen is flat. Bowel sounds are normal.     Palpations: Abdomen is soft.  Musculoskeletal: Normal range of motion.        General: No swelling or tenderness.  Skin:    General: Skin is warm and dry.  Neurological:     General: No focal deficit present.     Mental Status: She is oriented to person, place, and time.  Psychiatric:        Mood and Affect: Mood normal.        Behavior: Behavior normal.        Thought Content: Thought content normal.        Judgment: Judgment normal.     Lab Results  Component Value Date   WBC 4.5 03/09/2018   HGB 11.5 (L) 03/09/2018   HCT 34.8 (L) 03/09/2018   PLT 261.0 03/09/2018   GLUCOSE 129 (H) 03/09/2018   CHOL 153 03/09/2018   TRIG 118.0 03/09/2018   HDL 58.70 03/09/2018   LDLCALC 70 03/09/2018   ALT 10 01/12/2017   AST 12 01/12/2017   NA 142 03/09/2018   K 3.1 (L) 03/09/2018   CL 105 03/09/2018   CREATININE 0.89 03/09/2018   BUN 17 03/09/2018   CO2 31 03/09/2018   TSH 1.92 01/09/2016   HGBA1C 5.8 (A) 03/09/2018   MICROALBUR <0.7 03/09/2018    Mm 3d Screen Breast Bilateral  Result Date: 12/29/2017 CLINICAL DATA:  Screening. EXAM: DIGITAL SCREENING BILATERAL MAMMOGRAM WITH TOMO AND CAD COMPARISON:  Previous exam(s). ACR Breast Density Category c: The breast tissue is heterogeneously dense, which may obscure small masses. FINDINGS: There are no findings suspicious for malignancy. Images were processed with CAD. IMPRESSION: No mammographic evidence of malignancy. A result letter of this screening mammogram will be mailed directly to the patient. RECOMMENDATION: Screening mammogram in one year. (Code:SM-B-01Y) BI-RADS CATEGORY  1: Negative. Electronically Signed   By: Everlean Alstrom M.D.   On: 12/29/2017 14:32    Assessment & Plan:   Dallana was seen today  for hypertension, hyperlipidemia and diabetes.  Diagnoses and all orders for this visit:  Type 2 diabetes mellitus with complication, without long-term current use of insulin (HCC)-her A1c is at 5.8%.  Her blood sugars are adequately well controlled. -     POCT glycosylated hemoglobin (Hb A1C) -     Basic metabolic panel; Future -     Microalbumin / creatinine urine ratio; Future  Type II diabetes mellitus with manifestations ([place])  Essential hypertension- Her blood pressure is adequately well controlled but she has developed hypokalemia.  I have asked her  to start taking a potassium supplement. -     Basic metabolic panel; Future -     Urinalysis, Routine w reflex microscopic; Future -     valsartan-hydrochlorothiazide (DIOVAN-HCT) 160-25 MG tablet; Take 1 tablet by mouth daily. -     potassium chloride SA (K-DUR,KLOR-CON) 20 MEQ tablet; Take 1 tablet (20 mEq total) by mouth 2 (two) times daily.  Iron deficiency anemia secondary to inadequate dietary iron intake-her H&H are lower again.  I will recheck her iron level. -     CBC with Differential/Platelet; Future -     IBC panel; Future -     Ferritin; Future  Hyperlipidemia with target LDL less than 100- She has achieved her LDL goal and is doing well on the statin. -     Lipid panel; Future  Hypokalemia -     potassium chloride SA (K-DUR,KLOR-CON) 20 MEQ tablet; Take 1 tablet (20 mEq total) by mouth 2 (two) times daily. -     Magnesium; Future  Osteoarthritis of spine with myelopathy, unspecified spinal region -     meloxicam (MOBIC) 15 MG tablet; Take 1 tablet (15 mg total) by mouth daily.  Degenerative disc disease, lumbar -     meloxicam (MOBIC) 15 MG tablet; Take 1 tablet (15 mg total) by mouth daily.  Primary osteoarthritis involving multiple joints -     meloxicam (MOBIC) 15 MG tablet; Take 1 tablet (15 mg total) by mouth daily.  Hyperlipidemia, unspecified hyperlipidemia type -     atorvastatin (LIPITOR) 20 MG tablet;  Take 1 tablet (20 mg total) by mouth daily.  Deficiency anemia- I will screen her for vitamin deficiencies. -     IBC panel; Future -     Vitamin B12; Future -     Ferritin; Future -     Vitamin B1; Future -     Reticulocytes; Future -     Folate; Future   I have discontinued Roderic Ovens. Caponigro's aspirin and cyclobenzaprine. I am also having her maintain her cyanocobalamin, Bimatoprost (LUMIGAN OP), Lifitegrast, valsartan-hydrochlorothiazide, potassium chloride SA, meloxicam, and atorvastatin.  Meds ordered this encounter  Medications  . valsartan-hydrochlorothiazide (DIOVAN-HCT) 160-25 MG tablet    Sig: Take 1 tablet by mouth daily.    Dispense:  90 tablet    Refill:  1  . potassium chloride SA (K-DUR,KLOR-CON) 20 MEQ tablet    Sig: Take 1 tablet (20 mEq total) by mouth 2 (two) times daily.    Dispense:  180 tablet    Refill:  1  . meloxicam (MOBIC) 15 MG tablet    Sig: Take 1 tablet (15 mg total) by mouth daily.    Dispense:  90 tablet    Refill:  1  . atorvastatin (LIPITOR) 20 MG tablet    Sig: Take 1 tablet (20 mg total) by mouth daily.    Dispense:  90 tablet    Refill:  1   See AVS for instructions about healthy living and anticipatory guidance.  Follow-up: Return in about 6 months (around 09/08/2018).  Scarlette Calico, MD

## 2018-03-10 ENCOUNTER — Encounter: Payer: Self-pay | Admitting: Internal Medicine

## 2018-03-10 DIAGNOSIS — D539 Nutritional anemia, unspecified: Secondary | ICD-10-CM | POA: Insufficient documentation

## 2018-03-11 NOTE — Assessment & Plan Note (Signed)

## 2018-03-18 ENCOUNTER — Other Ambulatory Visit (INDEPENDENT_AMBULATORY_CARE_PROVIDER_SITE_OTHER): Payer: Medicare Other

## 2018-03-18 DIAGNOSIS — E876 Hypokalemia: Secondary | ICD-10-CM | POA: Diagnosis not present

## 2018-03-18 DIAGNOSIS — D508 Other iron deficiency anemias: Secondary | ICD-10-CM

## 2018-03-18 DIAGNOSIS — D539 Nutritional anemia, unspecified: Secondary | ICD-10-CM

## 2018-03-18 LAB — IBC PANEL
Iron: 59 ug/dL (ref 42–145)
Saturation Ratios: 17.9 % — ABNORMAL LOW (ref 20.0–50.0)
Transferrin: 235 mg/dL (ref 212.0–360.0)

## 2018-03-18 LAB — FOLATE: Folate: 10.3 ng/mL (ref >5.9)

## 2018-03-18 LAB — MAGNESIUM: Magnesium: 1.9 mg/dL (ref 1.5–2.5)

## 2018-03-18 LAB — FERRITIN: Ferritin: 165.1 ng/mL (ref 10.0–291.0)

## 2018-03-18 LAB — VITAMIN B12: Vitamin B-12: 340 pg/mL (ref 211–911)

## 2018-03-21 LAB — RETICULOCYTES
ABS Retic: 39240 cells/uL (ref 20000–8000)
Retic Ct Pct: 0.9 %

## 2018-03-21 LAB — VITAMIN B1: VITAMIN B1 (THIAMINE): 10 nmol/L (ref 8–30)

## 2018-03-24 ENCOUNTER — Encounter: Payer: Self-pay | Admitting: Internal Medicine

## 2018-04-19 DIAGNOSIS — H401131 Primary open-angle glaucoma, bilateral, mild stage: Secondary | ICD-10-CM | POA: Diagnosis not present

## 2018-04-19 DIAGNOSIS — H16223 Keratoconjunctivitis sicca, not specified as Sjogren's, bilateral: Secondary | ICD-10-CM | POA: Diagnosis not present

## 2018-05-03 ENCOUNTER — Telehealth: Payer: Self-pay | Admitting: Internal Medicine

## 2018-05-03 NOTE — Telephone Encounter (Signed)
Medication not delegated to NT to refill  

## 2018-05-03 NOTE — Telephone Encounter (Signed)
Copied from [facility] [id number]. Topic: Quick Communication - Rx Refill/Question >> May 03, 2018 10:44 AM Bea Graff, NT wrote: Medication: cyclobenzaprine (FLEXERIL) 10 MG tablet  Has the patient contacted their pharmacy? Yes.   (Agent: If no, request that the patient contact the pharmacy for the refill.) (Agent: If yes, when and what did the pharmacy advise?)  Preferred Pharmacy (with phone number or street name): [facility] - [place], [place] - [address] (Phone) [phone] (Fax)    Agent: Please be advised that RX refills may take up to 3 business days. We ask that you follow-up with your pharmacy.

## 2018-05-04 NOTE — Telephone Encounter (Signed)
Medication is not on patients medication list. Will wait for provider to return.

## 2018-05-05 ENCOUNTER — Other Ambulatory Visit: Payer: Self-pay | Admitting: Internal Medicine

## 2018-05-05 DIAGNOSIS — M51369 Other intervertebral disc degeneration, lumbar region without mention of lumbar back pain or lower extremity pain: Secondary | ICD-10-CM

## 2018-05-05 DIAGNOSIS — M5136 Other intervertebral disc degeneration, lumbar region: Secondary | ICD-10-CM

## 2018-05-05 MED ORDER — CYCLOBENZAPRINE HCL 5 MG PO TABS: 5.0000 mg | ORAL_TABLET | Freq: Three times a day (TID) | ORAL | 2 refills | Status: DC | PRN

## 2018-05-05 NOTE — Telephone Encounter (Signed)
Pt is requesting an rx for cyclobenzaprine. Pt stated that she is still taking the meloxicam.   Please advise if okay to rx.

## 2018-05-06 NOTE — Telephone Encounter (Signed)
PCP sent in flexeril per pt request.

## 2018-05-06 NOTE — Telephone Encounter (Signed)
Tried to call patient to inform of same. Line rang but then went dead.

## 2018-05-16 ENCOUNTER — Ambulatory Visit: Payer: Self-pay | Admitting: *Deleted

## 2018-05-16 NOTE — Telephone Encounter (Signed)
Noted  

## 2018-05-16 NOTE — Telephone Encounter (Signed)
Pt called with complaints of dizziness that started 05/13/2018 at 0200; she states that this continues but it better; she also has a headache rated 4 out of 10 intermittently since seen in office; recommendations made per nurse triage; pt normally sees Dr Scarlette Calico but he has no availibility; pt offered and accepted Dr Cathlean Cower, [place], 05/17/2018 at 31; she verbalized understanding; will route to office for notification.  Reason for Disposition . [1] MILD dizziness (e.g., walking normally) AND [2] has NOT been evaluated by physician for this  (Exception: dizziness caused by heat exposure, sudden standing, or poor fluid intake)  Answer Assessment - Initial Assessment Questions 1. DESCRIPTION: "Describe your dizziness."     lightheaded  2. LIGHTHEADED: "Do you feel lightheaded?" (e.g., somewhat faint, woozy, weak upon standing)   Room spinning at times; weak upon standing 3. VERTIGO: "Do you feel like either you or the room is spinning or tilting?" (i.e. vertigo)     yes 4. SEVERITY: "How bad is it?"  "Do you feel like you are going to faint?" "Can you stand and walk?"   - MILD - walking normally   - MODERATE - interferes with normal activities (e.g., work, school)    - SEVERE - unable to stand, requires support to walk, feels like passing out now.      mild 5. ONSET:  "When did the dizziness begin?"     05/13/2018 6. AGGRAVATING FACTORS: "Does anything make it worse?" (e.g., standing, change in head position)     Standing and changing position of her head 7. HEART RATE: "Can you tell me your heart rate?" "How many beats in 15 seconds?"  (Note: not all patients can do this)       21 beats in 15 seconds 8. CAUSE: "What do you think is causing the dizziness?"     Not sure 9. RECURRENT SYMPTOM: "Have you had dizziness before?" If so, ask: "When was the last time?" "What happened that time?"     no  10. OTHER SYMPTOMS: "Do you have any other symptoms?" (e.g., fever, chest pain, vomiting,  diarrhea, bleeding)       Headache, "light nausea" 11. PREGNANCY: "Is there any chance you are pregnant?" "When was your last menstrual period?"       no  Protocols used: [facility]

## 2018-05-17 ENCOUNTER — Ambulatory Visit (INDEPENDENT_AMBULATORY_CARE_PROVIDER_SITE_OTHER): Payer: Medicare Other | Admitting: Internal Medicine

## 2018-05-17 ENCOUNTER — Encounter: Payer: Self-pay | Admitting: Internal Medicine

## 2018-05-17 VITALS — BP 140/90 | HR 73 | Temp 98.0°F | Ht 66.0 in | Wt 191.0 lb

## 2018-05-17 DIAGNOSIS — R42 Dizziness and giddiness: Secondary | ICD-10-CM | POA: Diagnosis not present

## 2018-05-17 DIAGNOSIS — E118 Type 2 diabetes mellitus with unspecified complications: Secondary | ICD-10-CM | POA: Diagnosis not present

## 2018-05-17 DIAGNOSIS — M5136 Other intervertebral disc degeneration, lumbar region: Secondary | ICD-10-CM

## 2018-05-17 DIAGNOSIS — I10 Essential (primary) hypertension: Secondary | ICD-10-CM

## 2018-05-17 MED ORDER — MECLIZINE HCL 12.5 MG PO TABS: 12.5000 mg | ORAL_TABLET | Freq: Three times a day (TID) | ORAL | 1 refills | Status: AC | PRN

## 2018-05-17 MED ORDER — CYCLOBENZAPRINE HCL 5 MG PO TABS: 5.0000 mg | ORAL_TABLET | Freq: Three times a day (TID) | ORAL | 1 refills | Status: DC | PRN

## 2018-05-17 NOTE — Assessment & Plan Note (Signed)
stable overall by history and exam, recent data reviewed with pt, and pt to continue medical treatment as before,  to f/u any worsening symptoms or concerns  

## 2018-05-17 NOTE — Progress Notes (Signed)
Subjective:    Patient ID: Alexandria [name], female    DOB: [birth year], 78 y.o.   MRN: [id number]  HPI  Here to f/u with c/o vertigo like dizziness, weakness, and nausea; also has HA but seems like her chronic HA.  Dizziness is c/w room spinning with head movement; ongoing x 3 days, mild, intermittent, worse with sitting up or lying down, nothing makes better.  Seems to start after had her Hair done 3 wks ago with a short episode of a few minutes only .  Had MRI last mar 2019 due to HA neg for acute.  Pt denies new neurological symptoms such as new headache, or facial or extremity weakness or numbness  Pt denies polydipsia, polyuria.  Pt continues to have recurring LBP without change in severity, bowel or bladder change, fever, wt loss,  worsening LE pain/numbness/weakness, gait change or falls. Past Medical History:  Diagnosis Date  . Anemia, iron deficiency   . Cataracts, bilateral   . Chest pain, unspecified   . Colon polyp 09/2006   adenomatous  . Fibrocystic breast disease   . GERD (gastroesophageal reflux disease)   . Glaucoma, right eye   . Hyperlipidemia   . Hypertension   . LBP (low back pain)   . Osteoarthritis   . Osteopenia   . Seasonal allergies    Past Surgical History:  Procedure Laterality Date  . ABDOMINAL HYSTERECTOMY    . BREAST LUMPECTOMY     RIGHT 22 years ago  . CHOLECYSTECTOMY      reports that she has never smoked. She has never used smokeless tobacco. She reports that she does not drink alcohol or use drugs. family history includes Arthritis in an other family member; Hypertension in an other family member. Allergies  Allergen Reactions  . Oxycodone-Acetaminophen     REACTION: nausea and vomiting  . Tramadol Hcl     REACTION: nausea and vomiting   Current Outpatient Medications on File Prior to Visit  Medication Sig Dispense Refill  . atorvastatin (LIPITOR) 20 MG tablet Take 1 tablet (20 mg total) by mouth daily. 90 tablet 1  . Bimatoprost (LUMIGAN OP)  Apply to eye.    . cyanocobalamin 2000 MCG tablet Take 1 tablet (2,000 mcg total) by mouth daily. 90 tablet 3  . Lifitegrast (XIIDRA) 5 % SOLN Apply 1 drop to eye daily. Each eye. 5 each 0  . meloxicam (MOBIC) 15 MG tablet Take 1 tablet (15 mg total) by mouth daily. 90 tablet 1  . potassium chloride SA (K-DUR,KLOR-CON) 20 MEQ tablet Take 1 tablet (20 mEq total) by mouth 2 (two) times daily. 180 tablet 1  . valsartan-hydrochlorothiazide (DIOVAN-HCT) 160-25 MG tablet Take 1 tablet by mouth daily. 90 tablet 1   No current facility-administered medications on file prior to visit.    Review of Systems  Constitutional: Negative for other unusual diaphoresis or sweats HENT: Negative for ear discharge or swelling Eyes: Negative for other worsening visual disturbances Respiratory: Negative for stridor or other swelling  Gastrointestinal: Negative for worsening distension or other blood Genitourinary: Negative for retention or other urinary change Musculoskeletal: Negative for other MSK pain or swelling Skin: Negative for color change or other new lesions Neurological: Negative for worsening tremors and other numbness  Psychiatric/Behavioral: Negative for worsening agitation or other fatigue All other system neg per pt    Objective:   Physical Exam BP 140/90   Pulse 73   Temp 98 F (36.7 C) (Oral)   Ht 5'  6" (1.676 m)   Wt 191 lb (86.6 kg)   SpO2 95%   BMI 30.83 kg/m  VS noted,  Constitutional: Pt appears in NAD HENT: Head: NCAT.  Right Ear: External ear normal.  Left Ear: External ear normal.  Eyes: . Pupils are equal, round, and reactive to light. Conjunctivae and EOM are normal Nose: without d/c or deformity Neck: Neck supple. Gross normal ROM Cardiovascular: Normal rate and regular rhythm.   Pulmonary/Chest: Effort normal and breath sounds without rales or wheezing.  Abd:  Soft, NT, ND, + BS, no organomegaly Neurological: Pt is alert. At baseline orientation, motor grossly  intact Skin: Skin is warm. No rashes, other new lesions, no LE edema Psychiatric: Pt behavior is normal without agitation  No other exam findings Lab Results  Component Value Date   WBC 4.5 03/09/2018   HGB 11.5 (L) 03/09/2018   HCT 34.8 (L) 03/09/2018   PLT 261.0 03/09/2018   GLUCOSE 129 (H) 03/09/2018   CHOL 153 03/09/2018   TRIG 118.0 03/09/2018   HDL 58.70 03/09/2018   LDLCALC 70 03/09/2018   ALT 10 01/12/2017   AST 12 01/12/2017   NA 142 03/09/2018   K 3.1 (L) 03/09/2018   CL 105 03/09/2018   CREATININE 0.89 03/09/2018   BUN 17 03/09/2018   CO2 31 03/09/2018   TSH 1.92 01/09/2016   HGBA1C 5.8 (A) 03/09/2018   MICROALBUR <0.7 03/09/2018       Assessment & Plan:

## 2018-05-17 NOTE — Patient Instructions (Signed)
Please take all new medication as prescribed - the meclizine as needed  You will be contacted regarding the referral for: Vestibular Rehab  Please continue all other medications as before, and refills have been done if requested.  Please have the pharmacy call with any other refills you may need.  Please keep your appointments with your specialists as you may have planned

## 2018-05-17 NOTE — Assessment & Plan Note (Signed)
For flexeril prn

## 2018-05-17 NOTE — Assessment & Plan Note (Addendum)
C/ likely peripheral, neuro exam benign, c/w BPV, for meclizine prn,  Refer vestibular rehab clinic, to f/u any worsening symptoms or concerns

## 2018-06-22 ENCOUNTER — Telehealth: Payer: Self-pay | Admitting: Internal Medicine

## 2018-06-22 NOTE — Telephone Encounter (Signed)
Pt called and stated that she saw Dr. Jenny Reichmann 2/18 and she was called in Flexeril 5mg . Per pt in the past she has gotten flexeril 10mg  and this is what she would prefer. She does not have the rx for 5mg . Can someone send her in a new rx?

## 2018-06-22 NOTE — Telephone Encounter (Signed)
Pt is requesting refill of Flexeril 10 mg. Dr. Jenny Reichmann sent in 5 mg on 05/17/2018  #90 picked up on 06/16/2018.   Pt is wanting the rx for 10 mg flexeril to [facility] and the will mail when it is time for renew. Please advise.

## 2018-06-23 ENCOUNTER — Other Ambulatory Visit: Payer: Self-pay | Admitting: Internal Medicine

## 2018-06-23 DIAGNOSIS — M5136 Other intervertebral disc degeneration, lumbar region: Secondary | ICD-10-CM

## 2018-06-23 DIAGNOSIS — M51369 Other intervertebral disc degeneration, lumbar region without mention of lumbar back pain or lower extremity pain: Secondary | ICD-10-CM

## 2018-06-23 MED ORDER — CYCLOBENZAPRINE HCL 5 MG PO TABS: 5.0000 mg | ORAL_TABLET | Freq: Three times a day (TID) | ORAL | 1 refills | Status: DC | PRN

## 2018-06-23 NOTE — Telephone Encounter (Signed)
RX sent For safety concerns she has to remain on the 5 mg dose  TJ

## 2018-06-23 NOTE — Telephone Encounter (Signed)
Line is still busy

## 2018-06-23 NOTE — Telephone Encounter (Signed)
Tried to call pt but lines was busy.

## 2018-10-18 DIAGNOSIS — H16223 Keratoconjunctivitis sicca, not specified as Sjogren's, bilateral: Secondary | ICD-10-CM | POA: Diagnosis not present

## 2018-10-18 DIAGNOSIS — H401131 Primary open-angle glaucoma, bilateral, mild stage: Secondary | ICD-10-CM | POA: Diagnosis not present

## 2018-10-26 ENCOUNTER — Other Ambulatory Visit: Payer: Self-pay | Admitting: Internal Medicine

## 2018-10-26 ENCOUNTER — Telehealth: Payer: Self-pay | Admitting: Internal Medicine

## 2018-10-26 NOTE — Telephone Encounter (Deleted)
Forgot to mention patient is requesting this as an early fill because she is going out of town for two weeks.

## 2018-10-26 NOTE — Telephone Encounter (Addendum)
Medication Refill - Medication: meloxicam (MOBIC) 15 MG tablet (patient requesting early fill because she is going out of town for two weeks)  Has the patient contacted their pharmacy? Yes.   (Agent: If no, request that the patient contact the pharmacy for the refill.) (Agent: If yes, when and what did the pharmacy advise?)  Preferred Pharmacy (with phone number or street name):  [facility] - [place], [place] - [address]  [address] [place] [address]  Phone: [phone] Fax: [phone]   Agent: Please be advised that RX refills may take up to 3 business days. We ask that you follow-up with your pharmacy.

## 2018-10-29 NOTE — Telephone Encounter (Signed)
She is overdue for an appt  TJ

## 2018-10-31 NOTE — Telephone Encounter (Signed)
Appointment has been scheduled. She will be out of town for the next two weeks so the 18th would be the first thing she could do.

## 2018-10-31 NOTE — Telephone Encounter (Signed)
Called pt mobile, lvm for her to call back to schedule an appt.

## 2018-11-01 LAB — HM DIABETES EYE EXAM

## 2018-11-15 ENCOUNTER — Encounter: Payer: Self-pay | Admitting: Internal Medicine

## 2018-11-15 ENCOUNTER — Other Ambulatory Visit (INDEPENDENT_AMBULATORY_CARE_PROVIDER_SITE_OTHER): Payer: Medicare Other

## 2018-11-15 ENCOUNTER — Ambulatory Visit (INDEPENDENT_AMBULATORY_CARE_PROVIDER_SITE_OTHER): Payer: Medicare Other | Admitting: Internal Medicine

## 2018-11-15 ENCOUNTER — Other Ambulatory Visit: Payer: Self-pay

## 2018-11-15 VITALS — BP 152/82 | HR 73 | Temp 98.2°F | Resp 16 | Ht 66.0 in | Wt 198.0 lb

## 2018-11-15 DIAGNOSIS — E118 Type 2 diabetes mellitus with unspecified complications: Secondary | ICD-10-CM | POA: Diagnosis not present

## 2018-11-15 DIAGNOSIS — M150 Primary generalized (osteo)arthritis: Secondary | ICD-10-CM

## 2018-11-15 DIAGNOSIS — D508 Other iron deficiency anemias: Secondary | ICD-10-CM

## 2018-11-15 DIAGNOSIS — E785 Hyperlipidemia, unspecified: Secondary | ICD-10-CM

## 2018-11-15 DIAGNOSIS — M4710 Other spondylosis with myelopathy, site unspecified: Secondary | ICD-10-CM

## 2018-11-15 DIAGNOSIS — D539 Nutritional anemia, unspecified: Secondary | ICD-10-CM | POA: Diagnosis not present

## 2018-11-15 DIAGNOSIS — Z23 Encounter for immunization: Secondary | ICD-10-CM | POA: Diagnosis not present

## 2018-11-15 DIAGNOSIS — M159 Polyosteoarthritis, unspecified: Secondary | ICD-10-CM

## 2018-11-15 DIAGNOSIS — E559 Vitamin D deficiency, unspecified: Secondary | ICD-10-CM | POA: Diagnosis not present

## 2018-11-15 DIAGNOSIS — I10 Essential (primary) hypertension: Secondary | ICD-10-CM

## 2018-11-15 DIAGNOSIS — M5136 Other intervertebral disc degeneration, lumbar region: Secondary | ICD-10-CM

## 2018-11-15 LAB — CBC WITH DIFFERENTIAL/PLATELET
Basophils Absolute: 0 10*3/uL (ref 0.0–0.1)
Basophils Relative: 0.8 % (ref 0.0–3.0)
Eosinophils Absolute: 0.2 10*3/uL (ref 0.0–0.7)
Eosinophils Relative: 4.5 % (ref 0.0–5.0)
HCT: 35 % — ABNORMAL LOW (ref 36.0–46.0)
Hemoglobin: 11.4 g/dL — ABNORMAL LOW (ref 12.0–15.0)
Lymphocytes Relative: 38.1 % (ref 12.0–46.0)
Lymphs Abs: 1.5 10*3/uL (ref 0.7–4.0)
MCHC: 32.7 g/dL (ref 30.0–36.0)
MCV: 87 fl (ref 78.0–100.0)
Monocytes Absolute: 0.4 10*3/uL (ref 0.1–1.0)
Monocytes Relative: 9.4 % (ref 3.0–12.0)
Neutro Abs: 1.9 10*3/uL (ref 1.4–7.7)
Neutrophils Relative %: 47.2 % (ref 43.0–77.0)
Platelets: 251 10*3/uL (ref 150.0–400.0)
RBC: 4.02 Mil/uL (ref 3.87–5.11)
RDW: 14.9 % (ref 11.5–15.5)
WBC: 3.9 10*3/uL — ABNORMAL LOW (ref 4.0–10.5)

## 2018-11-15 LAB — TSH: TSH: 2.17 u[IU]/mL (ref 0.35–4.50)

## 2018-11-15 LAB — BASIC METABOLIC PANEL
BUN: 21 mg/dL (ref 6–23)
CO2: 28 mEq/L (ref 19–32)
Calcium: 9.1 mg/dL (ref 8.4–10.5)
Chloride: 105 mEq/L (ref 96–112)
Creatinine, Ser: 0.86 mg/dL (ref 0.40–1.20)
GFR: 77.52 mL/min (ref >60.00)
Glucose, Bld: 96 mg/dL (ref 70–99)
Potassium: 3.5 mEq/L (ref 3.5–5.1)
Sodium: 141 mEq/L (ref 135–145)

## 2018-11-15 LAB — IBC PANEL
Iron: 66 ug/dL (ref 42–145)
Saturation Ratios: 20 % (ref 20.0–50.0)
Transferrin: 236 mg/dL (ref 212.0–360.0)

## 2018-11-15 LAB — HEMOGLOBIN A1C: Hgb A1c MFr Bld: 6.6 % — ABNORMAL HIGH (ref 4.6–6.5)

## 2018-11-15 LAB — FERRITIN: Ferritin: 151.6 ng/mL (ref 10.0–291.0)

## 2018-11-15 LAB — VITAMIN D 25 HYDROXY (VIT D DEFICIENCY, FRACTURES): VITD: 39.19 ng/mL (ref 30.00–100.00)

## 2018-11-15 MED ORDER — INDAPAMIDE 2.5 MG PO TABS: 2.5000 mg | ORAL_TABLET | Freq: Every day | ORAL | 0 refills | Status: DC

## 2018-11-15 MED ORDER — MELOXICAM 15 MG PO TABS: 15.0000 mg | ORAL_TABLET | Freq: Every day | ORAL | 1 refills | Status: DC

## 2018-11-15 MED ORDER — IRBESARTAN 150 MG PO TABS: 150.0000 mg | ORAL_TABLET | Freq: Every day | ORAL | 1 refills | Status: DC

## 2018-11-15 MED ORDER — ATORVASTATIN CALCIUM 20 MG PO TABS: 20.0000 mg | ORAL_TABLET | Freq: Every day | ORAL | 1 refills | Status: DC

## 2018-11-15 MED ORDER — TURMERIC 500 MG PO CAPS: 1.0000 | ORAL_CAPSULE | Freq: Every day | ORAL | 1 refills | Status: DC

## 2018-11-15 NOTE — Patient Instructions (Signed)

## 2018-11-15 NOTE — Progress Notes (Signed)
Subjective:  Patient ID: Alexandria [name], female    DOB: [birth year]  Age: 78 y.o. MRN: [id number]  CC: Hypertension and Anemia   HPI Alexandria [name] presents for f/up - She complains that her blood pressure is not adequately well controlled.  She also complains of weight gain and mild lower extremity edema.  She has not been working on her lifestyle modifications recently.  She denies headache, blurred vision, CP, DOE, palpitations, dizziness, or lightheadedness.  Outpatient Medications Prior to Visit  Medication Sig Dispense Refill  . Bimatoprost (LUMIGAN OP) Apply to eye.    . cyanocobalamin 2000 MCG tablet Take 1 tablet (2,000 mcg total) by mouth daily. 90 tablet 3  . cyclobenzaprine (FLEXERIL) 5 MG tablet Take 1 tablet (5 mg total) by mouth 3 (three) times daily as needed for muscle spasms. 270 tablet 1  . Lifitegrast (XIIDRA) 5 % SOLN Apply 1 drop to eye daily. Each eye. 5 each 0  . meclizine (ANTIVERT) 12.5 MG tablet Take 1 tablet (12.5 mg total) by mouth 3 (three) times daily as needed for dizziness. 30 tablet 1  . potassium chloride SA (K-DUR,KLOR-CON) 20 MEQ tablet Take 1 tablet (20 mEq total) by mouth 2 (two) times daily. 180 tablet 1  . atorvastatin (LIPITOR) 20 MG tablet Take 1 tablet (20 mg total) by mouth daily. 90 tablet 1  . meloxicam (MOBIC) 15 MG tablet Take 1 tablet (15 mg total) by mouth daily. 90 tablet 1  . valsartan-hydrochlorothiazide (DIOVAN-HCT) 160-25 MG tablet Take 1 tablet by mouth daily. 90 tablet 1   No facility-administered medications prior to visit.     ROS Review of Systems  Constitutional: Positive for unexpected weight change (wt gain). Negative for diaphoresis and fatigue.  Respiratory: Negative for cough, chest tightness, shortness of breath and wheezing.   Cardiovascular: Positive for leg swelling.  Gastrointestinal: Negative for abdominal pain, blood in stool, constipation, diarrhea, nausea and vomiting.  Endocrine: Negative.   Genitourinary:  Negative.  Negative for difficulty urinating and hematuria.  Musculoskeletal: Positive for arthralgias and back pain. Negative for myalgias and neck pain.  Skin: Negative.   Neurological: Negative for dizziness, weakness and light-headedness.  Hematological: Negative for adenopathy. Does not bruise/bleed easily.  Psychiatric/Behavioral: Negative.     Objective:  BP (!) 152/82 (BP Location: Left Arm, Patient Position: Sitting, Cuff Size: Normal)   Pulse 73   Temp 98.2 F (36.8 C) (Oral)   Resp 16   Ht 5\' 6"  (1.676 m)   Wt 198 lb (89.8 kg)   SpO2 98%   BMI 31.96 kg/m   BP Readings from Last 3 Encounters:  11/15/18 (!) 152/82  05/17/18 140/90  03/09/18 140/70    Wt Readings from Last 3 Encounters:  11/15/18 198 lb (89.8 kg)  05/17/18 191 lb (86.6 kg)  03/09/18 198 lb (89.8 kg)    Physical Exam Vitals signs reviewed.  Constitutional:      Appearance: She is obese. She is not ill-appearing or diaphoretic.  HENT:     Nose: Nose normal.     Mouth/Throat:     Mouth: Mucous membranes are moist.     Pharynx: No oropharyngeal exudate or posterior oropharyngeal erythema.  Eyes:     General: No scleral icterus.    Conjunctiva/sclera: Conjunctivae normal.  Neck:     Musculoskeletal: Normal range of motion and neck supple. No neck rigidity or muscular tenderness.  Cardiovascular:     Rate and Rhythm: Normal rate and regular rhythm.  Heart sounds: Normal heart sounds. No murmur.  Pulmonary:     Effort: Pulmonary effort is normal. No respiratory distress.     Breath sounds: No stridor. No wheezing, rhonchi or rales.  Abdominal:     General: Abdomen is protuberant. Bowel sounds are normal. There is no distension.     Palpations: There is no hepatomegaly or splenomegaly.     Tenderness: There is no abdominal tenderness.  Musculoskeletal:     Right lower leg: No edema.     Left lower leg: No edema.  Lymphadenopathy:     Cervical: No cervical adenopathy.  Skin:    General:  Skin is warm and dry.     Coloration: Skin is not pale.  Neurological:     General: No focal deficit present.     Mental Status: She is oriented to person, place, and time. Mental status is at baseline.     Lab Results  Component Value Date   WBC 3.9 (L) 11/15/2018   HGB 11.4 (L) 11/15/2018   HCT 35.0 (L) 11/15/2018   PLT 251.0 11/15/2018   GLUCOSE 96 11/15/2018   CHOL 153 03/09/2018   TRIG 118.0 03/09/2018   HDL 58.70 03/09/2018   LDLCALC 70 03/09/2018   ALT 10 01/12/2017   AST 12 01/12/2017   NA 141 11/15/2018   K 3.5 11/15/2018   CL 105 11/15/2018   CREATININE 0.86 11/15/2018   BUN 21 11/15/2018   CO2 28 11/15/2018   TSH 2.17 11/15/2018   HGBA1C 6.6 (H) 11/15/2018   MICROALBUR <0.7 03/09/2018    Mm 3d Screen Breast Bilateral  Result Date: 12/29/2017 CLINICAL DATA:  Screening. EXAM: DIGITAL SCREENING BILATERAL MAMMOGRAM WITH TOMO AND CAD COMPARISON:  Previous exam(s). ACR Breast Density Category c: The breast tissue is heterogeneously dense, which may obscure small masses. FINDINGS: There are no findings suspicious for malignancy. Images were processed with CAD. IMPRESSION: No mammographic evidence of malignancy. A result letter of this screening mammogram will be mailed directly to the patient. RECOMMENDATION: Screening mammogram in one year. (Code:SM-B-01Y) BI-RADS CATEGORY  1: Negative. Electronically Signed   By: Everlean Alstrom M.D.   On: 12/29/2017 14:32    Assessment & Plan:   [name] was seen today for hypertension and anemia.  Diagnoses and all orders for this visit:  Essential hypertension-her blood pressure is not adequately well controlled.  I recommend that she stay on the current dose of the ARB but to upgrade to a more potent thiazide diuretic. -     Basic metabolic panel; Future -     TSH; Future -     indapamide (LOZOL) 2.5 MG tablet; Take 1 tablet (2.5 mg total) by mouth daily. -     irbesartan (AVAPRO) 150 MG tablet; Take 1 tablet (150 mg total) by  mouth daily.  Type II diabetes mellitus with manifestations (HCC)-her blood sugars are adequately well controlled. -     Hemoglobin A1c; Future -     Basic metabolic panel; Future -     irbesartan (AVAPRO) 150 MG tablet; Take 1 tablet (150 mg total) by mouth daily.  Deficiency anemia- She has a lifelong history of anemia.  I will screen her for hemoglobinopathies.  Iron deficiency anemia secondary to inadequate dietary iron intake -     CBC with Differential/Platelet; Future -     IBC panel; Future -     Ferritin; Future -     Cancel: Hemoglobinopathy evaluation; Future -     Hemoglobinopathy evaluation;  Future  Vitamin D deficiency -     VITAMIN D 25 Hydroxy (Vit-D Deficiency, Fractures); Future  Osteoarthritis of spine with myelopathy, unspecified spinal region -     meloxicam (MOBIC) 15 MG tablet; Take 1 tablet (15 mg total) by mouth daily.  Degenerative disc disease, lumbar -     meloxicam (MOBIC) 15 MG tablet; Take 1 tablet (15 mg total) by mouth daily. -     Turmeric 500 MG CAPS; Take 1 capsule by mouth daily.  Primary osteoarthritis involving multiple joints -     meloxicam (MOBIC) 15 MG tablet; Take 1 tablet (15 mg total) by mouth daily. -     Turmeric 500 MG CAPS; Take 1 capsule by mouth daily.  Hyperlipidemia, unspecified hyperlipidemia type -     atorvastatin (LIPITOR) 20 MG tablet; Take 1 tablet (20 mg total) by mouth daily. -     Turmeric 500 MG CAPS; Take 1 capsule by mouth daily.  Need for pneumococcal vaccination -     Pneumococcal polysaccharide vaccine 23-valent greater than or equal to 2yo subcutaneous/IM   I have discontinued Kadejah O. Garfield's valsartan-hydrochlorothiazide. I am also having her start on Turmeric, indapamide, and irbesartan. Additionally, I am having her maintain her cyanocobalamin, Bimatoprost (LUMIGAN OP), Lifitegrast, potassium chloride SA, meclizine, cyclobenzaprine, meloxicam, and atorvastatin.  Meds ordered this encounter  Medications   . meloxicam (MOBIC) 15 MG tablet    Sig: Take 1 tablet (15 mg total) by mouth daily.    Dispense:  90 tablet    Refill:  1  . atorvastatin (LIPITOR) 20 MG tablet    Sig: Take 1 tablet (20 mg total) by mouth daily.    Dispense:  90 tablet    Refill:  1  . Turmeric 500 MG CAPS    Sig: Take 1 capsule by mouth daily.    Dispense:  90 capsule    Refill:  1  . indapamide (LOZOL) 2.5 MG tablet    Sig: Take 1 tablet (2.5 mg total) by mouth daily.    Dispense:  90 tablet    Refill:  0  . irbesartan (AVAPRO) 150 MG tablet    Sig: Take 1 tablet (150 mg total) by mouth daily.    Dispense:  90 tablet    Refill:  1     Follow-up: Return in about 3 months (around 02/15/2019).  Scarlette Calico, MD

## 2018-11-17 LAB — HEMOGLOBINOPATHY EVALUATION
HGB C: 0 %
HGB S: 0 %
HGB VARIANT: 0 %
Hemoglobin A2 Quantitation: 2.5 % (ref 1.8–3.2)
Hemoglobin F Quantitation: 0 % (ref 0.0–2.0)
Hgb A: 97.5 % (ref 96.4–98.8)

## 2018-11-18 ENCOUNTER — Encounter: Payer: Self-pay | Admitting: Internal Medicine

## 2018-11-25 ENCOUNTER — Other Ambulatory Visit: Payer: Self-pay | Admitting: Internal Medicine

## 2018-11-25 DIAGNOSIS — Z1231 Encounter for screening mammogram for malignant neoplasm of breast: Secondary | ICD-10-CM

## 2018-12-16 ENCOUNTER — Other Ambulatory Visit: Payer: Self-pay | Admitting: *Deleted

## 2018-12-16 DIAGNOSIS — R6889 Other general symptoms and signs: Secondary | ICD-10-CM | POA: Diagnosis not present

## 2018-12-16 DIAGNOSIS — Z20822 Contact with and (suspected) exposure to covid-19: Secondary | ICD-10-CM

## 2018-12-18 LAB — NOVEL CORONAVIRUS, NAA: SARS-CoV-2, NAA: NOT DETECTED

## 2019-01-10 ENCOUNTER — Ambulatory Visit
Admission: RE | Admit: 2019-01-10 | Discharge: 2019-01-10 | Disposition: A | Discharge status: Home / Self Care | Payer: Medicare Other | Source: Ambulatory Visit | Attending: Internal Medicine | Admitting: Internal Medicine

## 2019-01-10 ENCOUNTER — Other Ambulatory Visit: Payer: Self-pay

## 2019-01-10 DIAGNOSIS — Z1231 Encounter for screening mammogram for malignant neoplasm of breast: Secondary | ICD-10-CM | POA: Diagnosis not present

## 2019-01-11 LAB — HM MAMMOGRAPHY

## 2019-03-04 ENCOUNTER — Encounter: Payer: Self-pay | Admitting: Emergency Medicine

## 2019-03-04 ENCOUNTER — Ambulatory Visit
Admission: EM | Admit: 2019-03-04 | Discharge: 2019-03-04 | Disposition: A | Discharge status: Home / Self Care | Payer: Medicare Other | Attending: Emergency Medicine | Admitting: Emergency Medicine

## 2019-03-04 ENCOUNTER — Other Ambulatory Visit: Payer: Self-pay

## 2019-03-04 DIAGNOSIS — Z20822 Contact with and (suspected) exposure to covid-19: Secondary | ICD-10-CM

## 2019-03-04 DIAGNOSIS — Z20828 Contact with and (suspected) exposure to other viral communicable diseases: Secondary | ICD-10-CM

## 2019-03-04 NOTE — ED Notes (Signed)
Patient able to ambulate independently  

## 2019-03-04 NOTE — ED Triage Notes (Signed)
Pt presents to [facility] for assessment after being exposed to daughter, who was COVID positive, on 03/02/19.  Denies symptoms at this time.

## 2019-03-04 NOTE — Discharge Instructions (Addendum)
Your COVID test is pending - it is important to quarantine / isolate at home until your results are back. °If you test positive and would like further evaluation for persistent or worsening symptoms, you may schedule an E-visit or virtual (video) visit throughout the [facility] MyChart app or website. ° °PLEASE NOTE: If you develop severe chest pain or shortness of breath please go to the ER or call [phone] for further evaluation --> DO NOT schedule electronic or virtual visits for this. °Please call our office for further guidance / recommendations as needed. °

## 2019-03-04 NOTE — ED Provider Notes (Signed)
[name]    CSN: [id number] Arrival date & time: 03/04/19  1429      History   Chief Complaint Chief Complaint  Patient presents with  . COVID Exposure    HPI Alexandria [name] is a 78 y.o. female   Presenting for Covid testing: Exposure: Daughter Date of exposure: 03/02/2019 Any fever, symptoms since exposure: No   Past Medical History:  Diagnosis Date  . Anemia, iron deficiency   . Cataracts, bilateral   . Chest pain, unspecified   . Colon polyp 09/2006   adenomatous  . Fibrocystic breast disease   . GERD (gastroesophageal reflux disease)   . Glaucoma, right eye   . Hyperlipidemia   . Hypertension   . LBP (low back pain)   . Osteoarthritis   . Osteopenia   . Seasonal allergies     Patient Active Problem List   Diagnosis Date Noted  . Vertigo 05/17/2018  . Intractable episodic headache 05/27/2017  . Primary osteoarthritis involving multiple joints 01/09/2016  . Gastroesophageal reflux disease with esophagitis 12/21/2013  . Vitamin D deficiency 10/30/2012  . Obesity (BMI 30.0-34.9) 10/28/2012  . Degenerative disc disease, lumbar 11/24/2011  . Type II diabetes mellitus with manifestations ([place]) 10/06/2011  . Routine general medical examination at a health care facility 10/06/2011  . Glaucoma 07/23/2008  . FIBROCYSTIC BREAST DISEASE 07/23/2008  . Hyperlipidemia with target LDL less than 100 04/19/2008  . Iron deficiency anemia 04/19/2008  . Essential hypertension 04/19/2008  . OA (osteoarthritis of spine) 04/19/2008  . OSTEOPENIA 04/19/2008    Past Surgical History:  Procedure Laterality Date  . ABDOMINAL HYSTERECTOMY    . BREAST LUMPECTOMY     RIGHT 22 years ago  . CHOLECYSTECTOMY      OB History   No obstetric history on file.      Home Medications    Prior to Admission medications   Medication Sig Start Date End Date Taking? Authorizing Provider  atorvastatin (LIPITOR) 20 MG tablet Take 1 tablet (20 mg total) by mouth daily.  11/15/18   Janith Lima, MD  Bimatoprost (LUMIGAN OP) Apply to eye.    [provider]  cyanocobalamin 2000 MCG tablet Take 1 tablet (2,000 mcg total) by mouth daily. 02/06/16   Janith Lima, MD  cyclobenzaprine (FLEXERIL) 5 MG tablet Take 1 tablet (5 mg total) by mouth 3 (three) times daily as needed for muscle spasms. 06/23/18   Janith Lima, MD  indapamide (LOZOL) 2.5 MG tablet Take 1 tablet (2.5 mg total) by mouth daily. 11/15/18   Janith Lima, MD  irbesartan (AVAPRO) 150 MG tablet Take 1 tablet (150 mg total) by mouth daily. 11/15/18   Janith Lima, MD  Lifitegrast Shirley Friar) 5 % SOLN Apply 1 drop to eye daily. Each eye. 02/24/17   Janith Lima, MD  meclizine (ANTIVERT) 12.5 MG tablet Take 1 tablet (12.5 mg total) by mouth 3 (three) times daily as needed for dizziness. 05/17/18 05/17/19  Biagio Borg, MD  meloxicam (MOBIC) 15 MG tablet Take 1 tablet (15 mg total) by mouth daily. 11/15/18   Janith Lima, MD  potassium chloride SA (K-DUR,KLOR-CON) 20 MEQ tablet Take 1 tablet (20 mEq total) by mouth 2 (two) times daily. 03/09/18   Janith Lima, MD  Turmeric 500 MG CAPS Take 1 capsule by mouth daily. 11/15/18   Janith Lima, MD    Family History Family History  Problem Relation Age of Onset  . Arthritis  Other   . Hypertension Other   . Colon cancer Neg Hx   . Esophageal cancer Neg Hx   . Rectal cancer Neg Hx   . Stomach cancer Neg Hx     Social History Social History   Tobacco Use  . Smoking status: Never Smoker  . Smokeless tobacco: Never Used  Substance Use Topics  . Alcohol use: No  . Drug use: No     Allergies   Oxycodone-acetaminophen and Tramadol hcl   Review of Systems Review of Systems  Constitutional: Negative for fatigue and fever.  HENT: Negative for ear pain, sinus pain, sore throat and voice change.   Eyes: Negative for pain, redness and visual disturbance.  Respiratory: Negative for cough and shortness of breath.    Cardiovascular: Negative for chest pain and palpitations.  Gastrointestinal: Negative for abdominal pain, diarrhea and vomiting.  Musculoskeletal: Negative for arthralgias and myalgias.  Skin: Negative for rash and wound.  Neurological: Negative for syncope and headaches.     Physical Exam Triage Vital Signs ED Triage Vitals  Enc Vitals Group     BP 03/04/19 1502 (!) 110/51     Pulse Rate 03/04/19 1502 83     Resp 03/04/19 1502 18     Temp 03/04/19 1502 97.7 F (36.5 C)     Temp Source 03/04/19 1502 Temporal     SpO2 --      Weight --      Height --      Head Circumference --      Peak Flow --      Pain Score 03/04/19 1503 0     Pain Loc --      Pain Edu? --      Excl. in [place]? --    No data found.  Updated Vital Signs BP (!) 110/51 (BP Location: Left Arm)   Pulse 83   Temp 97.7 F (36.5 C) (Temporal)   Resp 18   Visual Acuity Right Eye Distance:   Left Eye Distance:   Bilateral Distance:    Right Eye Near:   Left Eye Near:    Bilateral Near:     Physical Exam Constitutional:      General: She is not in acute distress. HENT:     Head: Normocephalic and atraumatic.  Eyes:     General: No scleral icterus.    Pupils: Pupils are equal, round, and reactive to light.  Cardiovascular:     Rate and Rhythm: Normal rate.  Pulmonary:     Effort: Pulmonary effort is normal.  Skin:    Coloration: Skin is not jaundiced or pale.  Neurological:     Mental Status: She is alert and oriented to person, place, and time.      UC Treatments / Results  Labs (all labs ordered are listed, but only abnormal results are displayed) Labs Reviewed  NOVEL CORONAVIRUS, NAA    EKG   Radiology No results found.  Procedures Procedures (including critical care time)  Medications Ordered in UC Medications - No data to display  Initial Impression / Assessment and Plan / UC Course  I have reviewed the triage vital signs and the nursing notes.  Pertinent labs & imaging  results that were available during my care of the patient were reviewed by me and considered in my medical decision making (see chart for details).     Covid PCR pending: Patient to quarantine for 2 weeks after exposure.  Return precautions discussed, patient verbalized understanding  and is agreeable to plan. Final Clinical Impressions(s) / UC Diagnoses   Final diagnoses:  Exposure to COVID-19 virus   Discharge Instructions   None    ED Prescriptions    None     PDMP not reviewed this encounter.   Hall[place], [place], [place] 03/04/19 1510

## 2019-03-06 LAB — NOVEL CORONAVIRUS, NAA: SARS-CoV-2, NAA: NOT DETECTED

## 2019-04-14 ENCOUNTER — Ambulatory Visit (INDEPENDENT_AMBULATORY_CARE_PROVIDER_SITE_OTHER): Payer: Medicare Other | Admitting: Family

## 2019-04-14 DIAGNOSIS — M2540 Effusion, unspecified joint: Secondary | ICD-10-CM

## 2019-04-14 DIAGNOSIS — T7840XA Allergy, unspecified, initial encounter: Secondary | ICD-10-CM | POA: Diagnosis not present

## 2019-04-14 NOTE — Progress Notes (Signed)
Alexandria [name] is a 79 y.o. female with the following history as recorded in EpicCare:  Patient Active Problem List   Diagnosis Date Noted  . Vertigo 05/17/2018  . Intractable episodic headache 05/27/2017  . Primary osteoarthritis involving multiple joints 01/09/2016  . Gastroesophageal reflux disease with esophagitis 12/21/2013  . Vitamin D deficiency 10/30/2012  . Obesity (BMI 30.0-34.9) 10/28/2012  . Degenerative disc disease, lumbar 11/24/2011  . Type II diabetes mellitus with manifestations ([place]) 10/06/2011  . Routine general medical examination at a health care facility 10/06/2011  . Glaucoma 07/23/2008  . FIBROCYSTIC BREAST DISEASE 07/23/2008  . Hyperlipidemia with target LDL less than 100 04/19/2008  . Iron deficiency anemia 04/19/2008  . Essential hypertension 04/19/2008  . OA (osteoarthritis of spine) 04/19/2008  . OSTEOPENIA 04/19/2008    Current Outpatient Medications  Medication Sig Dispense Refill  . atorvastatin (LIPITOR) 20 MG tablet Take 1 tablet (20 mg total) by mouth daily. 90 tablet 1  . Bimatoprost (LUMIGAN OP) Apply to eye.    . cyanocobalamin 2000 MCG tablet Take 1 tablet (2,000 mcg total) by mouth daily. 90 tablet 3  . cyclobenzaprine (FLEXERIL) 5 MG tablet Take 1 tablet (5 mg total) by mouth 3 (three) times daily as needed for muscle spasms. 270 tablet 1  . indapamide (LOZOL) 2.5 MG tablet Take 1 tablet (2.5 mg total) by mouth daily. 90 tablet 0  . irbesartan (AVAPRO) 150 MG tablet Take 1 tablet (150 mg total) by mouth daily. 90 tablet 1  . Lifitegrast (XIIDRA) 5 % SOLN Apply 1 drop to eye daily. Each eye. 5 each 0  . meclizine (ANTIVERT) 12.5 MG tablet Take 1 tablet (12.5 mg total) by mouth 3 (three) times daily as needed for dizziness. 30 tablet 1  . meloxicam (MOBIC) 15 MG tablet Take 1 tablet (15 mg total) by mouth daily. 90 tablet 1  . potassium chloride SA (K-DUR,KLOR-CON) 20 MEQ tablet Take 1 tablet (20 mEq total) by mouth 2 (two) times daily. 180  tablet 1  . Turmeric 500 MG CAPS Take 1 capsule by mouth daily. 90 capsule 1   No current facility-administered medications for this visit.    Allergies: Oxycodone-acetaminophen and Tramadol hcl  Past Medical History:  Diagnosis Date  . Anemia, iron deficiency   . Cataracts, bilateral   . Chest pain, unspecified   . Colon polyp 09/2006   adenomatous  . Fibrocystic breast disease   . GERD (gastroesophageal reflux disease)   . Glaucoma, right eye   . Hyperlipidemia   . Hypertension   . LBP (low back pain)   . Osteoarthritis   . Osteopenia   . Seasonal allergies     Past Surgical History:  Procedure Laterality Date  . ABDOMINAL HYSTERECTOMY    . BREAST LUMPECTOMY     RIGHT 22 years ago  . CHOLECYSTECTOMY      Family History  Problem Relation Age of Onset  . Arthritis Other   . Hypertension Other   . Colon cancer Neg Hx   . Esophageal cancer Neg Hx   . Rectal cancer Neg Hx   . Stomach cancer Neg Hx     Social History   Tobacco Use  . Smoking status: Never Smoker  . Smokeless tobacco: Never Used  Substance Use Topics  . Alcohol use: No    Subjective:    I connected with Alexandria [name] on 04/14/19 at  9:40 AM EST by a video enabled telemedicine application and verified that I am  speaking with the correct person using two identifiers. Provider in office/ patient is at home; provider and patient are only 2 people on video call.    I discussed the limitations of evaluation and management by telemedicine and the availability of in person appointments. The patient expressed understanding and agreed to proceed.  Patient notes that earlier this week she woke up with swelling around her eyes and in her fingers/ left arm; states that recurrent episodes of joint swelling have been occurring "for years." She has never discussed with her PCP. She typically takes Benadryl and the symptoms resolve in 1-2 days; she has now been symptom free for the past 3 days but is concerned because  she is scheduled for her COVID vaccine next Tuesday; she denies any fever, cough, shortness of breath; she is asking for reassurance that it is safe to get the COVID vaccine based on the symptoms she had earlier this week;     Objective:  There were no vitals filed for this visit.  General: Well developed, well nourished, in no acute distress  Skin : Warm and dry.  Head: Normocephalic and atraumatic  Eyes: Sclera and conjunctiva clear; no swelling noted Lungs: Respirations unlabored;  Neurologic: Alert and oriented; speech intact; face symmetrical;   Assessment:  1. Joint swelling   2. Allergic reaction, initial encounter     Plan:  Patient is asymptomatic today; notes symptoms have been occurring "on and off" for years; will send picture to her PCP from episode earlier this week; if she remains asymptomatic, okay to get COVID vaccine next week; follow-up to be determined.    No follow-ups on file.  No orders of the defined types were placed in this encounter.   Requested Prescriptions    No prescriptions requested or ordered in this encounter

## 2019-07-18 ENCOUNTER — Other Ambulatory Visit: Payer: Self-pay | Admitting: Internal Medicine

## 2019-07-18 ENCOUNTER — Telehealth: Payer: Self-pay | Admitting: Internal Medicine

## 2019-07-18 DIAGNOSIS — M5136 Other intervertebral disc degeneration, lumbar region: Secondary | ICD-10-CM

## 2019-07-18 DIAGNOSIS — I10 Essential (primary) hypertension: Secondary | ICD-10-CM

## 2019-07-18 DIAGNOSIS — E118 Type 2 diabetes mellitus with unspecified complications: Secondary | ICD-10-CM

## 2019-07-18 DIAGNOSIS — M4710 Other spondylosis with myelopathy, site unspecified: Secondary | ICD-10-CM

## 2019-07-18 DIAGNOSIS — M159 Polyosteoarthritis, unspecified: Secondary | ICD-10-CM

## 2019-07-18 MED ORDER — MELOXICAM 15 MG PO TABS: 15.0000 mg | ORAL_TABLET | Freq: Every day | ORAL | 0 refills | Status: DC

## 2019-07-18 MED ORDER — IRBESARTAN 150 MG PO TABS: 150.0000 mg | ORAL_TABLET | Freq: Every day | ORAL | 0 refills | Status: DC

## 2019-07-18 NOTE — Telephone Encounter (Signed)
Pt is coming in on Thursday for appt.   Request to refill the irbesartan and meloxicam.   Please advise

## 2019-07-18 NOTE — Telephone Encounter (Signed)
    1.Medication Requested: irbesartan (AVAPRO) 150 MG tablet meloxicam (MOBIC) 15 MG tablet  2. Pharmacy (Name, [address], [place]): [facility]  3. On Med List: yes  4. Last Visit with PCP: 11/15/18 5. Next visit date with PCP: 07/20/19   Agent: Please be advised that RX refills may take up to 3 business days. We ask that you follow-up with your pharmacy.

## 2019-07-20 ENCOUNTER — Encounter: Payer: Self-pay | Admitting: Internal Medicine

## 2019-07-20 ENCOUNTER — Other Ambulatory Visit: Payer: Self-pay

## 2019-07-20 ENCOUNTER — Ambulatory Visit (INDEPENDENT_AMBULATORY_CARE_PROVIDER_SITE_OTHER): Payer: Medicare Other | Admitting: Internal Medicine

## 2019-07-20 VITALS — BP 160/80 | HR 68 | Temp 97.8°F | Resp 16 | Ht 66.0 in | Wt 179.0 lb

## 2019-07-20 DIAGNOSIS — K2100 Gastro-esophageal reflux disease with esophagitis, without bleeding: Secondary | ICD-10-CM

## 2019-07-20 DIAGNOSIS — I10 Essential (primary) hypertension: Secondary | ICD-10-CM | POA: Diagnosis not present

## 2019-07-20 DIAGNOSIS — D508 Other iron deficiency anemias: Secondary | ICD-10-CM

## 2019-07-20 DIAGNOSIS — E118 Type 2 diabetes mellitus with unspecified complications: Secondary | ICD-10-CM | POA: Diagnosis not present

## 2019-07-20 DIAGNOSIS — Z0000 Encounter for general adult medical examination without abnormal findings: Secondary | ICD-10-CM | POA: Diagnosis not present

## 2019-07-20 DIAGNOSIS — Z780 Asymptomatic menopausal state: Secondary | ICD-10-CM

## 2019-07-20 DIAGNOSIS — Z23 Encounter for immunization: Secondary | ICD-10-CM

## 2019-07-20 DIAGNOSIS — E785 Hyperlipidemia, unspecified: Secondary | ICD-10-CM

## 2019-07-20 DIAGNOSIS — E559 Vitamin D deficiency, unspecified: Secondary | ICD-10-CM

## 2019-07-20 LAB — MICROALBUMIN / CREATININE URINE RATIO
Creatinine,U: 142.5 mg/dL
Microalb Creat Ratio: 0.8 mg/g (ref 0.0–30.0)
Microalb, Ur: 1.1 mg/dL (ref 0.0–1.9)

## 2019-07-20 LAB — CBC WITH DIFFERENTIAL/PLATELET
Basophils Absolute: 0 10*3/uL (ref 0.0–0.1)
Basophils Relative: 0.4 % (ref 0.0–3.0)
Eosinophils Absolute: 0.2 10*3/uL (ref 0.0–0.7)
Eosinophils Relative: 3.8 % (ref 0.0–5.0)
HCT: 33.9 % — ABNORMAL LOW (ref 36.0–46.0)
Hemoglobin: 11.2 g/dL — ABNORMAL LOW (ref 12.0–15.0)
Lymphocytes Relative: 30.8 % (ref 12.0–46.0)
Lymphs Abs: 1.5 10*3/uL (ref 0.7–4.0)
MCHC: 33 g/dL (ref 30.0–36.0)
MCV: 86.8 fl (ref 78.0–100.0)
Monocytes Absolute: 0.4 10*3/uL (ref 0.1–1.0)
Monocytes Relative: 9 % (ref 3.0–12.0)
Neutro Abs: 2.6 10*3/uL (ref 1.4–7.7)
Neutrophils Relative %: 56 % (ref 43.0–77.0)
Platelets: 253 10*3/uL (ref 150.0–400.0)
RBC: 3.9 Mil/uL (ref 3.87–5.11)
RDW: 15.2 % (ref 11.5–15.5)
WBC: 4.7 10*3/uL (ref 4.0–10.5)

## 2019-07-20 LAB — URINALYSIS, ROUTINE W REFLEX MICROSCOPIC
Bilirubin Urine: NEGATIVE
Hgb urine dipstick: NEGATIVE
Ketones, ur: NEGATIVE
Nitrite: NEGATIVE
RBC / HPF: NONE SEEN (ref 0–?)
Specific Gravity, Urine: 1.025 (ref 1.000–1.030)
Total Protein, Urine: NEGATIVE
Urine Glucose: NEGATIVE
Urobilinogen, UA: 0.2 (ref 0.0–1.0)
pH: 5.5 (ref 5.0–8.0)

## 2019-07-20 LAB — LIPID PANEL
Cholesterol: 143 mg/dL (ref 0–200)
HDL: 64.9 mg/dL (ref >39.00)
LDL Cholesterol: 59 mg/dL (ref 0–99)
NonHDL: 78.57
Total CHOL/HDL Ratio: 2
Triglycerides: 96 mg/dL (ref 0.0–149.0)
VLDL: 19.2 mg/dL (ref 0.0–40.0)

## 2019-07-20 LAB — BASIC METABOLIC PANEL
BUN: 21 mg/dL (ref 6–23)
CO2: 32 mEq/L (ref 19–32)
Calcium: 9.2 mg/dL (ref 8.4–10.5)
Chloride: 104 mEq/L (ref 96–112)
Creatinine, Ser: 0.95 mg/dL (ref 0.40–1.20)
GFR: 68.99 mL/min (ref >60.00)
Glucose, Bld: 92 mg/dL (ref 70–99)
Potassium: 4.6 mEq/L (ref 3.5–5.1)
Sodium: 141 mEq/L (ref 135–145)

## 2019-07-20 LAB — HEPATIC FUNCTION PANEL
ALT: 15 U/L (ref 0–35)
AST: 17 U/L (ref 0–37)
Albumin: 4 g/dL (ref 3.5–5.2)
Alkaline Phosphatase: 45 U/L (ref 39–117)
Bilirubin, Direct: 0.2 mg/dL (ref 0.0–0.3)
Total Bilirubin: 0.5 mg/dL (ref 0.2–1.2)
Total Protein: 7.2 g/dL (ref 6.0–8.3)

## 2019-07-20 LAB — VITAMIN D 25 HYDROXY (VIT D DEFICIENCY, FRACTURES): VITD: 42.02 ng/mL (ref 30.00–100.00)

## 2019-07-20 LAB — FERRITIN: Ferritin: 182.4 ng/mL (ref 10.0–291.0)

## 2019-07-20 LAB — TSH: TSH: 0.98 u[IU]/mL (ref 0.35–4.50)

## 2019-07-20 LAB — IBC PANEL
Iron: 37 ug/dL — ABNORMAL LOW (ref 42–145)
Saturation Ratios: 11.3 % — ABNORMAL LOW (ref 20.0–50.0)
Transferrin: 233 mg/dL (ref 212.0–360.0)

## 2019-07-20 LAB — HEMOGLOBIN A1C: Hgb A1c MFr Bld: 6.4 % (ref 4.6–6.5)

## 2019-07-20 MED ORDER — TETANUS-DIPHTH-ACELL PERTUSSIS 5-2.5-18.5 LF-MCG/0.5 IM SUSP: 0.5000 mL | Freq: Once | INTRAMUSCULAR | 0 refills | Status: DC

## 2019-07-20 MED ORDER — FUSION PLUS PO CAPS: 1.0000 | ORAL_CAPSULE | Freq: Every day | ORAL | 1 refills | Status: DC

## 2019-07-20 MED ORDER — ATORVASTATIN CALCIUM 20 MG PO TABS: 20.0000 mg | ORAL_TABLET | Freq: Every day | ORAL | 1 refills | Status: DC

## 2019-07-20 MED ORDER — INDAPAMIDE 1.25 MG PO TABS: 1.2500 mg | ORAL_TABLET | Freq: Every day | ORAL | 0 refills | Status: DC

## 2019-07-20 MED ORDER — IRBESARTAN 150 MG PO TABS: 150.0000 mg | ORAL_TABLET | Freq: Every day | ORAL | 0 refills | Status: DC

## 2019-07-20 MED ORDER — ZOSTER VAC RECOMB ADJUVANTED 50 MCG/0.5ML IM SUSR: 0.5000 mL | Freq: Once | INTRAMUSCULAR | 1 refills | Status: DC

## 2019-07-20 NOTE — Progress Notes (Signed)
Subjective:  Patient ID: Alexandria [name], female    DOB: 1940-06-11  Age: 79 y.o. MRN: [id number]  CC: Anemia, Annual Exam, Diabetes, Hyperlipidemia, Gastroesophageal Reflux, and Hypertension  This visit occurred during the SARS-CoV-2 public health emergency.  Safety protocols were in place, including screening questions prior to the visit, additional usage of staff PPE, and extensive cleaning of exam room while observing appropriate contact time as indicated for disinfecting solutions.    HPI Alexandria [name] presents for a CPX.  She tells me that she has intentionally lost weight over the last year with lifestyle modifications.  Her husband died a month ago from kidney failure.  She tells me she is managing her grief pretty well.  She denies any recent episodes of headache, blurred vision, chest pain, shortness of breath, palpitations, edema, or fatigue.  She has not taken irbesartan or indapamide over the last few days because she ran out.  Outpatient Medications Prior to Visit  Medication Sig Dispense Refill  . Bimatoprost (LUMIGAN OP) Apply to eye.    Marland Kitchen Lifitegrast (XIIDRA) 5 % SOLN Apply 1 drop to eye daily. Each eye. 5 each 0  . potassium chloride SA (K-DUR,KLOR-CON) 20 MEQ tablet Take 1 tablet (20 mEq total) by mouth 2 (two) times daily. 180 tablet 1  . Turmeric 500 MG CAPS Take 1 capsule by mouth daily. 90 capsule 1  . atorvastatin (LIPITOR) 20 MG tablet Take 1 tablet (20 mg total) by mouth daily. 90 tablet 1  . cyanocobalamin 2000 MCG tablet Take 1 tablet (2,000 mcg total) by mouth daily. 90 tablet 3  . cyclobenzaprine (FLEXERIL) 5 MG tablet Take 1 tablet (5 mg total) by mouth 3 (three) times daily as needed for muscle spasms. 270 tablet 1  . indapamide (LOZOL) 2.5 MG tablet Take 1 tablet (2.5 mg total) by mouth daily. 90 tablet 0  . irbesartan (AVAPRO) 150 MG tablet Take 1 tablet (150 mg total) by mouth daily. 90 tablet 0  . meloxicam (MOBIC) 15 MG tablet Take 1 tablet (15 mg total)  by mouth daily. 90 tablet 0   No facility-administered medications prior to visit.    ROS Review of Systems  Constitutional: Negative.  Negative for appetite change, diaphoresis, fatigue and unexpected weight change.  HENT: Negative.  Negative for trouble swallowing.   Eyes: Negative for visual disturbance.  Respiratory: Negative for cough, chest tightness, shortness of breath and wheezing.   Cardiovascular: Negative for chest pain, palpitations and leg swelling.  Gastrointestinal: Negative for abdominal pain, anal bleeding, blood in stool, constipation, diarrhea, nausea and vomiting.  Endocrine: Negative.   Genitourinary: Negative.  Negative for difficulty urinating and hematuria.  Musculoskeletal: Negative.  Negative for arthralgias and myalgias.  Skin: Negative.  Negative for color change, pallor and rash.  Neurological: Negative.  Negative for dizziness, weakness, light-headedness and numbness.  Hematological: Negative for adenopathy. Does not bruise/bleed easily.  Psychiatric/Behavioral: Negative.  Negative for dysphoric mood and sleep disturbance. The patient is not nervous/anxious.     Objective:  BP (!) 160/80 (BP Location: Left Arm, Patient Position: Sitting, Cuff Size: Large)   Pulse 68   Temp 97.8 F (36.6 C) (Oral)   Resp 16   Ht 5\' 6"  (1.676 m)   Wt 179 lb (81.2 kg)   SpO2 97%   BMI 28.89 kg/m   BP Readings from Last 3 Encounters:  07/20/19 (!) 160/80  03/04/19 (!) 110/51  11/15/18 (!) 152/82    Wt Readings from Last 3 Encounters:  07/20/19 179 lb (81.2 kg)  11/15/18 198 lb (89.8 kg)  05/17/18 191 lb (86.6 kg)    Physical Exam Vitals reviewed.  Constitutional:      Appearance: Normal appearance.  HENT:     Nose: Nose normal.     Mouth/Throat:     Mouth: Mucous membranes are moist.     Pharynx: No posterior oropharyngeal erythema.  Eyes:     General: No scleral icterus.    Conjunctiva/sclera: Conjunctivae normal.  Cardiovascular:     Rate and  Rhythm: Normal rate and regular rhythm.     Heart sounds: No murmur.  Pulmonary:     Effort: Pulmonary effort is normal.     Breath sounds: No stridor. No wheezing, rhonchi or rales.  Abdominal:     General: Abdomen is flat. Bowel sounds are normal. There is no distension.     Palpations: Abdomen is soft. There is no hepatomegaly, splenomegaly or mass.     Tenderness: There is no abdominal tenderness. There is no guarding.  Musculoskeletal:        General: Normal range of motion.     Cervical back: Neck supple.     Right lower leg: No edema.     Left lower leg: No edema.  Lymphadenopathy:     Cervical: No cervical adenopathy.  Skin:    General: Skin is warm and dry.     Coloration: Skin is not pale.  Neurological:     General: No focal deficit present.     Mental Status: She is alert and oriented to person, place, and time. Mental status is at baseline.  Psychiatric:        Mood and Affect: Mood normal.        Behavior: Behavior normal.     Lab Results  Component Value Date   WBC 4.7 07/20/2019   HGB 11.2 (L) 07/20/2019   HCT 33.9 (L) 07/20/2019   PLT 253.0 07/20/2019   GLUCOSE 92 07/20/2019   CHOL 143 07/20/2019   TRIG 96.0 07/20/2019   HDL 64.90 07/20/2019   LDLCALC 59 07/20/2019   ALT 15 07/20/2019   AST 17 07/20/2019   NA 141 07/20/2019   K 4.6 07/20/2019   CL 104 07/20/2019   CREATININE 0.95 07/20/2019   BUN 21 07/20/2019   CO2 32 07/20/2019   TSH 0.98 07/20/2019   HGBA1C 6.4 07/20/2019   MICROALBUR 1.1 07/20/2019    No results found.  Assessment & Plan:   Alexandria [name] was seen today for anemia, annual exam, diabetes, hyperlipidemia, gastroesophageal reflux and hypertension.  Diagnoses and all orders for this visit:  Essential hypertension- She has not achieved her blood pressure goal of 130/80.  She was praised for her lifestyle modifications and asked to restart irbesartan and indapamide.  Her labs are negative for secondary causes or endorgan damage. -      Basic metabolic panel; Future -     TSH; Future -     Urinalysis, Routine w reflex microscopic; Future -     irbesartan (AVAPRO) 150 MG tablet; Take 1 tablet (150 mg total) by mouth daily. -     indapamide (LOZOL) 1.25 MG tablet; Take 1 tablet (1.25 mg total) by mouth daily. -     Urinalysis, Routine w reflex microscopic -     TSH -     Basic metabolic panel  Type II diabetes mellitus with manifestations ([place])- Her A1c is at 6.4%.  Her blood sugars are adequately well controlled. -  Basic metabolic panel; Future -     Hemoglobin A1c; Future -     Microalbumin / creatinine urine ratio; Future -     HM Diabetes Foot Exam -     irbesartan (AVAPRO) 150 MG tablet; Take 1 tablet (150 mg total) by mouth daily. -     Microalbumin / creatinine urine ratio -     Hemoglobin A1c -     Basic metabolic panel  Gastroesophageal reflux disease with esophagitis without hemorrhage- I recommended that she stop taking NSAIDs.  She is having no symptoms related to this so I did not recommend that she take an H2 blocker or PPI. -     CBC with Differential/Platelet; Future -     CBC with Differential/Platelet  Routine general medical examination at a health care facility- Exam completed, labs reviewed, vaccines reviewed and updated, cervical cancer screening is not indicated, screening for breast and colon cancer is up-to-date, patient education was given.  Iron deficiency anemia secondary to inadequate dietary iron intake-she offers no sources of blood loss.  This is likely due to poor p.o. intake. -     IBC panel; Future -     CBC with Differential/Platelet; Future -     Ferritin; Future -     Ferritin -     CBC with Differential/Platelet -     IBC panel -     Iron-FA-B Cmp-C-Biot-Probiotic (FUSION PLUS) CAPS; Take 1 capsule by mouth daily.  Hyperlipidemia with target LDL less than 100-she has achieved her LDL goal and is doing well on the statin. -     Lipid panel; Future -     TSH; Future -      Hepatic function panel; Future -     Hepatic function panel -     TSH -     Lipid panel  Vitamin D deficiency-her vitamin D level is normal now. -     VITAMIN D 25 Hydroxy (Vit-D Deficiency, Fractures); Future -     VITAMIN D 25 Hydroxy (Vit-D Deficiency, Fractures)  Need for Tdap vaccination -     Cancel: Tdap vaccine greater than or equal to 7yo IM -     Discontinue: Tdap (BOOSTRIX) 5-2.5-18.5 LF-MCG/0.5 injection; Inject 0.5 mLs into the muscle once for 1 dose.  Need for shingles vaccine -     Discontinue: Zoster Vaccine Adjuvanted [facility]) injection; Inject 0.5 mLs into the muscle once for 1 dose.  Postmenopausal estrogen deficiency -     DG Bone Density; Future  Hyperlipidemia, unspecified hyperlipidemia type- She has achieved her LDL goal is doing well on the statin. -     atorvastatin (LIPITOR) 20 MG tablet; Take 1 tablet (20 mg total) by mouth daily.   I have discontinued Roderic Ovens. Carducci's cyanocobalamin, cyclobenzaprine, indapamide, meloxicam, Zoster Vaccine Adjuvanted, and Tdap. I am also having her start on indapamide and Fusion Plus. Additionally, I am having her maintain her Bimatoprost (LUMIGAN OP), Lifitegrast, potassium chloride SA, Turmeric, irbesartan, and atorvastatin.  Meds ordered this encounter  Medications  . DISCONTD: Zoster Vaccine Adjuvanted [facility]) injection    Sig: Inject 0.5 mLs into the muscle once for 1 dose.    Dispense:  0.5 mL    Refill:  1  . DISCONTD: Tdap (BOOSTRIX) 5-2.5-18.5 LF-MCG/0.5 injection    Sig: Inject 0.5 mLs into the muscle once for 1 dose.    Dispense:  0.5 mL    Refill:  0  . irbesartan (AVAPRO) 150 MG tablet  Sig: Take 1 tablet (150 mg total) by mouth daily.    Dispense:  90 tablet    Refill:  0  . atorvastatin (LIPITOR) 20 MG tablet    Sig: Take 1 tablet (20 mg total) by mouth daily.    Dispense:  90 tablet    Refill:  1  . indapamide (LOZOL) 1.25 MG tablet    Sig: Take 1 tablet (1.25 mg total) by mouth daily.     Dispense:  90 tablet    Refill:  0  . Iron-FA-B Cmp-C-Biot-Probiotic (FUSION PLUS) CAPS    Sig: Take 1 capsule by mouth daily.    Dispense:  90 capsule    Refill:  1   In addition to time spent on CPE, I spent 50 minutes in preparing to see the patient by review of recent labs, imaging and procedures, obtaining and reviewing separately obtained history, communicating with the patient and family or caregiver, ordering medications, tests or procedures, and documenting clinical information in the EHR including the differential Dx, treatment, and any further evaluation and other management of 1. Essential hypertension 2. Type II diabetes mellitus with manifestations ([place]) 3. Gastroesophageal reflux disease with esophagitis without hemorrhage 4. Iron deficiency anemia secondary to inadequate dietary iron intake 5. Hyperlipidemia with target LDL less than 100 7. Vitamin D deficiency 8. Postmenopausal estrogen deficiency     Follow-up: Return in about 3 months (around 10/19/2019).  Scarlette Calico, MD

## 2019-07-20 NOTE — Patient Instructions (Signed)
Health Maintenance, Female Adopting a healthy lifestyle and getting preventive care are important in promoting health and wellness. Ask your health care provider about:  The right schedule for you to have regular tests and exams.  Things you can do on your own to prevent diseases and keep yourself healthy. What should I know about diet, weight, and exercise? Eat a healthy diet   Eat a diet that includes plenty of vegetables, fruits, low-fat dairy products, and lean protein.  Do not eat a lot of foods that are high in solid fats, added sugars, or sodium. Maintain a healthy weight Body mass index (BMI) is used to identify weight problems. It estimates body fat based on height and weight. Your health care provider can help determine your BMI and help you achieve or maintain a healthy weight. Get regular exercise Get regular exercise. This is one of the most important things you can do for your health. Most adults should:  Exercise for at least 150 minutes each week. The exercise should increase your heart rate and make you sweat (moderate-intensity exercise).  Do strengthening exercises at least twice a week. This is in addition to the moderate-intensity exercise.  Spend less time sitting. Even light physical activity can be beneficial. Watch cholesterol and blood lipids Have your blood tested for lipids and cholesterol at 77 years of age, then have this test every 5 years. Have your cholesterol levels checked more often if:  Your lipid or cholesterol levels are high.  You are older than 77 years of age.  You are at high risk for heart disease. What should I know about cancer screening? Depending on your health history and family history, you may need to have cancer screening at various ages. This may include screening for:  Breast cancer.  Cervical cancer.  Colorectal cancer.  Skin cancer.  Lung cancer. What should I know about heart disease, diabetes, and high blood  pressure? Blood pressure and heart disease  High blood pressure causes heart disease and increases the risk of stroke. This is more likely to develop in people who have high blood pressure readings, are of African descent, or are overweight.  Have your blood pressure checked: ? Every 3-5 years if you are 18-39 years of age. ? Every year if you are 40 years old or older. Diabetes Have regular diabetes screenings. This checks your fasting blood sugar level. Have the screening done:  Once every three years after age 40 if you are at a normal weight and have a low risk for diabetes.  More often and at a younger age if you are overweight or have a high risk for diabetes. What should I know about preventing infection? Hepatitis B If you have a higher risk for hepatitis B, you should be screened for this virus. Talk with your health care provider to find out if you are at risk for hepatitis B infection. Hepatitis C Testing is recommended for:  Everyone born from 1945 through 1965.  Anyone with known risk factors for hepatitis C. Sexually transmitted infections (STIs)  Get screened for STIs, including gonorrhea and chlamydia, if: ? You are sexually active and are younger than 77 years of age. ? You are older than 77 years of age and your health care provider tells you that you are at risk for this type of infection. ? Your sexual activity has changed since you were last screened, and you are at increased risk for chlamydia or gonorrhea. Ask your health care provider if   you are at risk.  Ask your health care provider about whether you are at high risk for HIV. Your health care provider may recommend a prescription medicine to help prevent HIV infection. If you choose to take medicine to prevent HIV, you should first get tested for HIV. You should then be tested every 3 months for as long as you are taking the medicine. Pregnancy  If you are about to stop having your period (premenopausal) and  you may become pregnant, seek counseling before you get pregnant.  Take 400 to 800 micrograms (mcg) of folic acid every day if you become pregnant.  Ask for birth control (contraception) if you want to prevent pregnancy. Osteoporosis and menopause Osteoporosis is a disease in which the bones lose minerals and strength with aging. This can result in bone fractures. If you are 65 years old or older, or if you are at risk for osteoporosis and fractures, ask your health care provider if you should:  Be screened for bone loss.  Take a calcium or vitamin D supplement to lower your risk of fractures.  Be given hormone replacement therapy (HRT) to treat symptoms of menopause. Follow these instructions at home: Lifestyle  Do not use any products that contain nicotine or tobacco, such as cigarettes, e-cigarettes, and chewing tobacco. If you need help quitting, ask your health care provider.  Do not use street drugs.  Do not share needles.  Ask your health care provider for help if you need support or information about quitting drugs. Alcohol use  Do not drink alcohol if: ? Your health care provider tells you not to drink. ? You are pregnant, may be pregnant, or are planning to become pregnant.  If you drink alcohol: ? Limit how much you use to 0-1 drink a day. ? Limit intake if you are breastfeeding.  Be aware of how much alcohol is in your drink. In the [place] one drink equals one 12 oz bottle of beer (355 mL), one 5 oz glass of wine (148 mL), or one 1 oz glass of hard liquor (44 mL). General instructions  Schedule regular health, dental, and eye exams.  Stay current with your vaccines.  Tell your health care provider if: ? You often feel depressed. ? You have ever been abused or do not feel safe at home. Summary  Adopting a healthy lifestyle and getting preventive care are important in promoting health and wellness.  Follow your health care provider's instructions about healthy  diet, exercising, and getting tested or screened for diseases.  Follow your health care provider's instructions on monitoring your cholesterol and blood pressure. This information is not intended to replace advice given to you by your health care provider. Make sure you discuss any questions you have with your health care provider. Document Revised: 03/09/2018 Document Reviewed: 03/09/2018 Elsevier Patient Education  2020 [employer]  

## 2019-07-24 ENCOUNTER — Other Ambulatory Visit: Payer: Self-pay | Admitting: Internal Medicine

## 2019-07-24 DIAGNOSIS — E118 Type 2 diabetes mellitus with unspecified complications: Secondary | ICD-10-CM

## 2019-07-24 DIAGNOSIS — I10 Essential (primary) hypertension: Secondary | ICD-10-CM

## 2019-07-24 MED ORDER — LOSARTAN POTASSIUM 100 MG PO TABS: 100.0000 mg | ORAL_TABLET | Freq: Every day | ORAL | 1 refills | Status: DC

## 2019-07-27 ENCOUNTER — Other Ambulatory Visit: Payer: Self-pay | Admitting: Internal Medicine

## 2019-07-27 ENCOUNTER — Telehealth: Payer: Self-pay

## 2019-07-27 DIAGNOSIS — M150 Primary generalized (osteo)arthritis: Secondary | ICD-10-CM

## 2019-07-27 DIAGNOSIS — D508 Other iron deficiency anemias: Secondary | ICD-10-CM

## 2019-07-27 DIAGNOSIS — E785 Hyperlipidemia, unspecified: Secondary | ICD-10-CM

## 2019-07-27 DIAGNOSIS — E118 Type 2 diabetes mellitus with unspecified complications: Secondary | ICD-10-CM

## 2019-07-27 DIAGNOSIS — M5136 Other intervertebral disc degeneration, lumbar region: Secondary | ICD-10-CM

## 2019-07-27 DIAGNOSIS — M51369 Other intervertebral disc degeneration, lumbar region without mention of lumbar back pain or lower extremity pain: Secondary | ICD-10-CM

## 2019-07-27 DIAGNOSIS — E876 Hypokalemia: Secondary | ICD-10-CM

## 2019-07-27 DIAGNOSIS — I10 Essential (primary) hypertension: Secondary | ICD-10-CM

## 2019-07-27 DIAGNOSIS — M159 Polyosteoarthritis, unspecified: Secondary | ICD-10-CM

## 2019-07-27 MED ORDER — ATORVASTATIN CALCIUM 20 MG PO TABS: 20.0000 mg | ORAL_TABLET | Freq: Every day | ORAL | 1 refills | Status: DC

## 2019-07-27 MED ORDER — LOSARTAN POTASSIUM 100 MG PO TABS: 100.0000 mg | ORAL_TABLET | Freq: Every day | ORAL | 1 refills | Status: DC

## 2019-07-27 MED ORDER — TURMERIC 500 MG PO CAPS: 1.0000 | ORAL_CAPSULE | Freq: Every day | ORAL | 1 refills | Status: AC

## 2019-07-27 MED ORDER — INDAPAMIDE 1.25 MG PO TABS: 1.2500 mg | ORAL_TABLET | Freq: Every day | ORAL | 1 refills | Status: DC

## 2019-07-27 MED ORDER — POTASSIUM CHLORIDE CRYS ER 20 MEQ PO TBCR: 20.0000 meq | EXTENDED_RELEASE_TABLET | Freq: Two times a day (BID) | ORAL | 1 refills | Status: DC

## 2019-07-27 MED ORDER — FUSION PLUS PO CAPS: 1.0000 | ORAL_CAPSULE | Freq: Every day | ORAL | 1 refills | Status: DC

## 2019-07-27 NOTE — Telephone Encounter (Signed)
1.Medication Requested:  atorvastatin (LIPITOR) 20 MG tablet Bimatoprost (LUMIGAN OP) indapamide (LOZOL) 1.25 MG tablet Iron-FA-B Cmp-C-Biot-Probiotic (FUSION PLUS) CAPS Lifitegrast (XIIDRA) 5 % SOLN losartan (COZAAR) 100 MG tablet potassium chloride SA (K-DUR,KLOR-CON) 20 MEQ tablet Turmeric 500 MG CAPS  2. Pharmacy (Name, [place], City):[facility] - [place], [place] - [address]  3. On Med List: Yes   4. Last Visit with PCP: 4.22.2021   5. Next visit date with PCP: 7.22.21    Agent: Please be advised that RX refills may take up to 3 business days. We ask that you follow-up with your pharmacy.

## 2019-07-28 NOTE — Telephone Encounter (Signed)
Spoke with patient on yesterday and info given.

## 2019-08-01 ENCOUNTER — Ambulatory Visit (INDEPENDENT_AMBULATORY_CARE_PROVIDER_SITE_OTHER)
Admission: RE | Admit: 2019-08-01 | Discharge: 2019-08-01 | Disposition: A | Discharge status: Home / Self Care | Payer: Medicare Other | Source: Ambulatory Visit | Attending: Internal Medicine | Admitting: Internal Medicine

## 2019-08-01 ENCOUNTER — Other Ambulatory Visit: Payer: Self-pay

## 2019-08-01 DIAGNOSIS — Z780 Asymptomatic menopausal state: Secondary | ICD-10-CM | POA: Diagnosis not present

## 2019-08-09 DIAGNOSIS — D508 Other iron deficiency anemias: Secondary | ICD-10-CM | POA: Diagnosis not present

## 2019-08-09 DIAGNOSIS — K9049 Malabsorption due to intolerance, not elsewhere classified: Secondary | ICD-10-CM | POA: Diagnosis not present

## 2019-08-09 LAB — HM DEXA SCAN: HM Dexa Scan: NORMAL

## 2019-08-11 ENCOUNTER — Encounter: Payer: Self-pay | Admitting: Internal Medicine

## 2019-08-11 DIAGNOSIS — D508 Other iron deficiency anemias: Secondary | ICD-10-CM

## 2019-08-14 MED ORDER — FUSION PLUS PO CAPS: 1.0000 | ORAL_CAPSULE | Freq: Every day | ORAL | 1 refills | Status: DC

## 2019-08-16 DIAGNOSIS — K9049 Malabsorption due to intolerance, not elsewhere classified: Secondary | ICD-10-CM | POA: Diagnosis not present

## 2019-08-16 DIAGNOSIS — D508 Other iron deficiency anemias: Secondary | ICD-10-CM | POA: Diagnosis not present

## 2019-08-22 ENCOUNTER — Telehealth: Payer: Self-pay

## 2019-08-22 NOTE — Telephone Encounter (Signed)
Called patient to try and schedule Iron Infusion follow up, unable to leave voicemail. If patient's calls back, please schedule in about a week.

## 2019-08-22 NOTE — Telephone Encounter (Signed)
Erx was sent on 08/14/2019  Message for pt to schedule follow up from iron infusion within the next few weeks.

## 2019-08-22 NOTE — Telephone Encounter (Signed)
New message    The patient voiced had an iron infusion Are there any labs to follow up?   Please advise.

## 2019-08-22 NOTE — Telephone Encounter (Signed)
Can you schedule pt for an appointment in about a week to follow up on the iron infusions?

## 2019-08-22 NOTE — Telephone Encounter (Signed)
1.Medication Requested:Iron-FA-B Cmp-C-Biot-Probiotic (FUSION PLUS) CAPS  2. Pharmacy (Name, [place], City):[facility] - [place], [place] - [address]  3. On Med List: Yes   4. Last Visit with PCP: 4.22.21  5. Next visit date with PCP: 7.22.21    Agent: Please be advised that RX refills may take up to 3 business days. We ask that you follow-up with your pharmacy.

## 2019-10-12 ENCOUNTER — Other Ambulatory Visit: Payer: Self-pay | Admitting: Internal Medicine

## 2019-10-12 DIAGNOSIS — I10 Essential (primary) hypertension: Secondary | ICD-10-CM

## 2019-10-19 ENCOUNTER — Ambulatory Visit (INDEPENDENT_AMBULATORY_CARE_PROVIDER_SITE_OTHER): Payer: Medicare Other

## 2019-10-19 ENCOUNTER — Other Ambulatory Visit: Payer: Self-pay

## 2019-10-19 ENCOUNTER — Ambulatory Visit (INDEPENDENT_AMBULATORY_CARE_PROVIDER_SITE_OTHER): Payer: Medicare Other | Admitting: Internal Medicine

## 2019-10-19 ENCOUNTER — Encounter: Payer: Self-pay | Admitting: Internal Medicine

## 2019-10-19 VITALS — BP 126/78 | HR 69 | Temp 98.1°F | Ht 66.0 in | Wt 178.0 lb

## 2019-10-19 DIAGNOSIS — D508 Other iron deficiency anemias: Secondary | ICD-10-CM

## 2019-10-19 DIAGNOSIS — E118 Type 2 diabetes mellitus with unspecified complications: Secondary | ICD-10-CM

## 2019-10-19 DIAGNOSIS — Z1159 Encounter for screening for other viral diseases: Secondary | ICD-10-CM

## 2019-10-19 DIAGNOSIS — E876 Hypokalemia: Secondary | ICD-10-CM

## 2019-10-19 DIAGNOSIS — M150 Primary generalized (osteo)arthritis: Secondary | ICD-10-CM

## 2019-10-19 DIAGNOSIS — I10 Essential (primary) hypertension: Secondary | ICD-10-CM | POA: Diagnosis not present

## 2019-10-19 DIAGNOSIS — M79672 Pain in left foot: Secondary | ICD-10-CM | POA: Insufficient documentation

## 2019-10-19 DIAGNOSIS — M159 Polyosteoarthritis, unspecified: Secondary | ICD-10-CM

## 2019-10-19 DIAGNOSIS — N1831 Chronic kidney disease, stage 3a: Secondary | ICD-10-CM

## 2019-10-19 DIAGNOSIS — E785 Hyperlipidemia, unspecified: Secondary | ICD-10-CM

## 2019-10-19 DIAGNOSIS — M7989 Other specified soft tissue disorders: Secondary | ICD-10-CM | POA: Diagnosis not present

## 2019-10-19 DIAGNOSIS — M8949 Other hypertrophic osteoarthropathy, multiple sites: Secondary | ICD-10-CM

## 2019-10-19 MED ORDER — METHYLPREDNISOLONE 4 MG PO TBPK: ORAL_TABLET | ORAL | 0 refills | Status: DC

## 2019-10-19 MED ORDER — RELAFEN DS 1000 MG PO TABS: 1.0000 | ORAL_TABLET | Freq: Every day | ORAL | 0 refills | Status: DC | PRN

## 2019-10-19 NOTE — Progress Notes (Signed)
Subjective:  Patient ID: Alexandria [name], female    DOB: [birth year]  Age: 79 y.o. MRN: [id number]  CC: Osteoarthritis, Anemia, Hypertension, and Diabetes  This visit occurred during the SARS-CoV-2 public health emergency.  Safety protocols were in place, including screening questions prior to the visit, additional usage of staff PPE, and extensive cleaning of exam room while observing appropriate contact time as indicated for disinfecting solutions.   HPI Alexandria [name] presents for f/up - She complains of a 3-week history of nontraumatic, intermittent pain and swelling in her left ankle and foot.  She is not getting much symptom relief with Tylenol.  She tells me her blood pressure has been well controlled.  She is active and denies any recent episodes of headache, blurred vision, chest pain, shortness of breath, palpitations, edema, or fatigue.  Outpatient Medications Prior to Visit  Medication Sig Dispense Refill  . atorvastatin (LIPITOR) 20 MG tablet Take 1 tablet (20 mg total) by mouth daily. 90 tablet 1  . Bimatoprost (LUMIGAN OP) Apply to eye.    . indapamide (LOZOL) 1.25 MG tablet TAKE 1 TABLET BY MOUTH EVERY DAY 90 tablet 0  . Iron-FA-B Cmp-C-Biot-Probiotic (FUSION PLUS) CAPS Take 1 capsule by mouth daily. 90 capsule 1  . losartan (COZAAR) 100 MG tablet Take 1 tablet (100 mg total) by mouth daily. 90 tablet 1  . potassium chloride SA (KLOR-CON) 20 MEQ tablet Take 1 tablet (20 mEq total) by mouth 2 (two) times daily. 180 tablet 1  . Turmeric 500 MG CAPS Take 1 capsule by mouth daily. 90 capsule 1  . Lifitegrast (XIIDRA) 5 % SOLN Apply 1 drop to eye daily. Each eye. 5 each 0   No facility-administered medications prior to visit.    ROS Review of Systems  Constitutional: Negative.  Negative for chills, diaphoresis, fatigue and fever.  HENT: Negative.   Eyes: Negative.   Respiratory: Negative for cough, chest tightness, shortness of breath and wheezing.   Cardiovascular: Negative  for chest pain, palpitations and leg swelling.  Gastrointestinal: Negative for abdominal pain, constipation, diarrhea, nausea and vomiting.  Endocrine: Negative.   Genitourinary: Negative.  Negative for difficulty urinating.  Musculoskeletal: Positive for arthralgias. Negative for myalgias.  Skin: Negative for color change and pallor.  Neurological: Negative.  Negative for dizziness, weakness, light-headedness and numbness.  Hematological: Negative for adenopathy. Does not bruise/bleed easily.  Psychiatric/Behavioral: Negative.     Objective:  BP 126/78   Pulse 69   Temp 98.1 F (36.7 C) (Oral)   Ht 5\' 6"  (1.676 m)   Wt 178 lb (80.7 kg)   SpO2 98%   BMI 28.73 kg/m   BP Readings from Last 3 Encounters:  10/19/19 126/78  07/20/19 (!) 160/80  03/04/19 (!) 110/51    Wt Readings from Last 3 Encounters:  10/19/19 178 lb (80.7 kg)  07/20/19 179 lb (81.2 kg)  11/15/18 198 lb (89.8 kg)    Physical Exam Vitals reviewed.  Constitutional:      Appearance: Normal appearance.  HENT:     Nose: Nose normal.     Mouth/Throat:     Mouth: Mucous membranes are moist.  Eyes:     General: No scleral icterus.    Conjunctiva/sclera: Conjunctivae normal.  Cardiovascular:     Rate and Rhythm: Normal rate and regular rhythm.     Heart sounds: No murmur heard.   Pulmonary:     Effort: Pulmonary effort is normal.     Breath sounds: No wheezing, rhonchi  or rales.  Abdominal:     General: Abdomen is flat.     Palpations: There is no mass.     Tenderness: There is no abdominal tenderness. There is no guarding.  Musculoskeletal:     Cervical back: Neck supple.  Feet:     Comments: Degenerative changes and mild tenderness noted in the left ankle and foot.  There is no erythema, swelling, or point tenderness. Lymphadenopathy:     Cervical: No cervical adenopathy.  Skin:    General: Skin is warm and dry.     Coloration: Skin is not pale.  Neurological:     General: No focal deficit  present.     Mental Status: She is alert and oriented to person, place, and time. Mental status is at baseline.     Lab Results  Component Value Date   WBC 4.1 10/19/2019   HGB 11.5 (L) 10/19/2019   HCT 35.9 10/19/2019   PLT 301 10/19/2019   GLUCOSE 93 10/19/2019   CHOL 143 07/20/2019   TRIG 96.0 07/20/2019   HDL 64.90 07/20/2019   LDLCALC 59 07/20/2019   ALT 15 07/20/2019   AST 17 07/20/2019   NA 141 10/19/2019   K 4.1 10/19/2019   CL 103 10/19/2019   CREATININE 0.94 (H) 10/19/2019   BUN 29 (H) 10/19/2019   CO2 28 10/19/2019   TSH 0.98 07/20/2019   HGBA1C 6.0 (H) 10/19/2019   MICROALBUR 1.1 07/20/2019     DG Foot Complete Left  Result Date: 10/19/2019 CLINICAL DATA:  Left foot pain and swelling for 3 weeks, no known injury, initial encounter EXAM: LEFT FOOT - COMPLETE 3+ VIEW COMPARISON:  None. FINDINGS: Tarsal degenerative changes and calcaneal spurring is seen. Soft tissue swelling is noted over the distal metatarsals. Hallux valgus deformity is noted with degenerative changes at the first MTP joint. No acute fracture is noted. IMPRESSION: Chronic changes without acute abnormality. Electronically Signed   By: Inez Catalina M.D.   On: 10/19/2019 10:53    Assessment & Plan:   Alexandria [name] was seen today for osteoarthritis.  Diagnoses and all orders for this visit:  Iron deficiency anemia secondary to inadequate dietary iron intake- Her H&H have improved.  Her ferritin level is elevated so I think she has some component of anemia of chronic disease.  Will follow. -     Cancel: Iron, TIBC and Ferritin Panel; Future -     CBC with Differential/Platelet; Future -     CBC with Differential/Platelet -     Transferrin; Future -     Ferritin; Future -     Ferritin -     Transferrin  Hyperlipidemia, unspecified hyperlipidemia type  Essential hypertension- Her blood pressure is adequately well controlled.  Electrolytes are normal.  She has had a slight decline in her renal  function. -     BASIC METABOLIC PANEL WITH GFR; Future -     BASIC METABOLIC PANEL WITH GFR  Type II diabetes mellitus with manifestations ([place])- Her blood sugar is adequately well controlled. -     BASIC METABOLIC PANEL WITH GFR; Future -     Hemoglobin A1c; Future -     Hemoglobin A1c -     BASIC METABOLIC PANEL WITH GFR  Hypokalemia- Her potassium level is in the normal range. -     BASIC METABOLIC PANEL WITH GFR; Future -     BASIC METABOLIC PANEL WITH GFR  Acute pain of left foot- Based on her symptoms, exam,  lab work, and x-ray I think she is having a flareup of osteoarthritis.  I recommended a course of nabumetone and methylprednisolone. -     Sedimentation rate; Future -     Uric acid; Future -     DG Foot Complete Left; Future -     Uric acid -     Sedimentation rate  Primary osteoarthritis involving multiple joints -     Nabumetone (RELAFEN DS) 1000 MG TABS; Take 1 tablet by mouth daily as needed. -     methylPREDNISolone (MEDROL DOSEPAK) 4 MG TBPK tablet; TAKE AS DIRECTED  Need for hepatitis C screening test -     Hepatitis C antibody; Future -     Hepatitis C antibody  Stage 3a chronic kidney disease- She has had a slight decrease in her renal function.  Will use NSAIDs judiciously.   I have discontinued Roderic Ovens. Sou's Lifitegrast. I am also having her start on Relafen DS and methylPREDNISolone. Additionally, I am having her maintain her Bimatoprost (LUMIGAN OP), Turmeric, losartan, atorvastatin, potassium chloride SA, Fusion Plus, and indapamide.  Meds ordered this encounter  Medications  . Nabumetone (RELAFEN DS) 1000 MG TABS    Sig: Take 1 tablet by mouth daily as needed.    Dispense:  12 tablet    Refill:  0  . methylPREDNISolone (MEDROL DOSEPAK) 4 MG TBPK tablet    Sig: TAKE AS DIRECTED    Dispense:  21 tablet    Refill:  0   I spent 50 minutes in preparing to see the patient by review of recent labs, imaging and procedures, obtaining and reviewing  separately obtained history, communicating with the patient and family or caregiver, ordering medications, tests or procedures, and documenting clinical information in the EHR including the differential Dx, treatment, and any further evaluation and other management of 1. Hyperlipidemia, unspecified hyperlipidemia type 2. Essential hypertension 3. Type II diabetes mellitus with manifestations ([place]) 4. Hypokalemia 5. Iron deficiency anemia secondary to inadequate dietary iron intake 6. Acute pain of left foot 7. Primary osteoarthritis involving multiple joints 8. Stage 3a chronic kidney disease      Follow-up: Return in about 3 months (around 01/19/2020).  Scarlette Calico, MD

## 2019-10-19 NOTE — Patient Instructions (Signed)

## 2019-10-20 ENCOUNTER — Other Ambulatory Visit: Payer: Self-pay | Admitting: Internal Medicine

## 2019-10-20 DIAGNOSIS — Z1159 Encounter for screening for other viral diseases: Secondary | ICD-10-CM | POA: Insufficient documentation

## 2019-10-20 DIAGNOSIS — N1831 Chronic kidney disease, stage 3a: Secondary | ICD-10-CM | POA: Insufficient documentation

## 2019-10-20 DIAGNOSIS — M159 Polyosteoarthritis, unspecified: Secondary | ICD-10-CM

## 2019-10-20 DIAGNOSIS — M4710 Other spondylosis with myelopathy, site unspecified: Secondary | ICD-10-CM

## 2019-10-20 DIAGNOSIS — M5136 Other intervertebral disc degeneration, lumbar region: Secondary | ICD-10-CM

## 2019-10-20 LAB — CBC WITH DIFFERENTIAL/PLATELET
Absolute Monocytes: 340 cells/uL (ref 200–950)
Basophils Absolute: 21 cells/uL (ref 0–200)
Basophils Relative: 0.5 %
Eosinophils Absolute: 119 cells/uL (ref 15–500)
Eosinophils Relative: 2.9 %
HCT: 35.9 % (ref 35.0–45.0)
Hemoglobin: 11.5 g/dL — ABNORMAL LOW (ref 11.7–15.5)
Lymphs Abs: 1255 cells/uL (ref 850–3900)
MCH: 28.8 pg (ref 27.0–33.0)
MCHC: 32 g/dL (ref 32.0–36.0)
MCV: 89.8 fL (ref 80.0–100.0)
MPV: 10.2 fL (ref 7.5–12.5)
Monocytes Relative: 8.3 %
Neutro Abs: 2366 cells/uL (ref 1500–7800)
Neutrophils Relative %: 57.7 %
Platelets: 301 10*3/uL (ref 140–400)
RBC: 4 10*6/uL (ref 3.80–5.10)
RDW: 14 % (ref 11.0–15.0)
Total Lymphocyte: 30.6 %
WBC: 4.1 10*3/uL (ref 3.8–10.8)

## 2019-10-20 LAB — BASIC METABOLIC PANEL WITH GFR
BUN/Creatinine Ratio: 31 (calc) — ABNORMAL HIGH (ref 6–22)
BUN: 29 mg/dL — ABNORMAL HIGH (ref 7–25)
CO2: 28 mmol/L (ref 20–32)
Calcium: 9.7 mg/dL (ref 8.6–10.4)
Chloride: 103 mmol/L (ref 98–110)
Creat: 0.94 mg/dL — ABNORMAL HIGH (ref 0.60–0.93)
GFR, Est African American: 68 mL/min/{1.73_m2} (ref >=60)
GFR, Est Non African American: 59 mL/min/{1.73_m2} — ABNORMAL LOW (ref >=60)
Glucose, Bld: 93 mg/dL (ref 65–99)
Potassium: 4.1 mmol/L (ref 3.5–5.3)
Sodium: 141 mmol/L (ref 135–146)

## 2019-10-20 LAB — HEMOGLOBIN A1C
Hgb A1c MFr Bld: 6 % of total Hgb — ABNORMAL HIGH (ref <5.7)
Mean Plasma Glucose: 126 (calc)
eAG (mmol/L): 7 (calc)

## 2019-10-20 LAB — URIC ACID: Uric Acid, Serum: 5 mg/dL (ref 2.5–7.0)

## 2019-10-20 LAB — HEPATITIS C ANTIBODY
Hepatitis C Ab: NONREACTIVE
SIGNAL TO CUT-OFF: 0.02 (ref <1.00)

## 2019-10-20 LAB — FERRITIN: Ferritin: 661 ng/mL — ABNORMAL HIGH (ref 16–288)

## 2019-10-20 LAB — SEDIMENTATION RATE: Sed Rate: 36 mm/h — ABNORMAL HIGH (ref 0–30)

## 2019-10-20 LAB — TRANSFERRIN: Transferrin: 228 mg/dL (ref 188–341)

## 2019-10-25 ENCOUNTER — Other Ambulatory Visit: Payer: Self-pay | Admitting: Internal Medicine

## 2019-10-25 ENCOUNTER — Encounter: Payer: Self-pay | Admitting: Internal Medicine

## 2019-10-25 DIAGNOSIS — M159 Polyosteoarthritis, unspecified: Secondary | ICD-10-CM

## 2019-10-25 DIAGNOSIS — M4710 Other spondylosis with myelopathy, site unspecified: Secondary | ICD-10-CM

## 2019-10-25 DIAGNOSIS — M5136 Other intervertebral disc degeneration, lumbar region: Secondary | ICD-10-CM

## 2019-10-25 DIAGNOSIS — M150 Primary generalized (osteo)arthritis: Secondary | ICD-10-CM

## 2019-10-25 DIAGNOSIS — M51369 Other intervertebral disc degeneration, lumbar region without mention of lumbar back pain or lower extremity pain: Secondary | ICD-10-CM

## 2019-10-25 MED ORDER — NABUMETONE 500 MG PO TABS: 500.0000 mg | ORAL_TABLET | Freq: Two times a day (BID) | ORAL | 0 refills | Status: DC | PRN

## 2019-12-20 ENCOUNTER — Other Ambulatory Visit: Payer: Self-pay | Admitting: Internal Medicine

## 2019-12-20 DIAGNOSIS — Z1231 Encounter for screening mammogram for malignant neoplasm of breast: Secondary | ICD-10-CM

## 2020-01-15 ENCOUNTER — Ambulatory Visit
Admission: RE | Admit: 2020-01-15 | Discharge: 2020-01-15 | Disposition: A | Discharge status: Home / Self Care | Payer: Medicare Other | Source: Ambulatory Visit | Attending: Internal Medicine | Admitting: Internal Medicine

## 2020-01-15 ENCOUNTER — Other Ambulatory Visit: Payer: Self-pay

## 2020-01-15 DIAGNOSIS — Z1231 Encounter for screening mammogram for malignant neoplasm of breast: Secondary | ICD-10-CM | POA: Diagnosis not present

## 2020-01-17 LAB — HM MAMMOGRAPHY

## 2020-01-29 ENCOUNTER — Telehealth: Payer: Self-pay | Admitting: Internal Medicine

## 2020-01-29 NOTE — Telephone Encounter (Signed)
potassium chloride SA (KLOR-CON) 20 MEQ tablet losartan (COZAAR) 100 MG tablet indapamide (LOZOL) 1.25 MG tablet atorvastatin (LIPITOR) 20 MG tablet [facility] [place], [place] - [address] Phone:  [phone]  Fax:  [phone]

## 2020-01-30 ENCOUNTER — Other Ambulatory Visit: Payer: Self-pay | Admitting: Internal Medicine

## 2020-01-30 DIAGNOSIS — I10 Essential (primary) hypertension: Secondary | ICD-10-CM

## 2020-01-30 DIAGNOSIS — E785 Hyperlipidemia, unspecified: Secondary | ICD-10-CM

## 2020-01-30 DIAGNOSIS — E118 Type 2 diabetes mellitus with unspecified complications: Secondary | ICD-10-CM

## 2020-01-30 DIAGNOSIS — E876 Hypokalemia: Secondary | ICD-10-CM

## 2020-01-30 MED ORDER — ATORVASTATIN CALCIUM 20 MG PO TABS: 20.0000 mg | ORAL_TABLET | Freq: Every day | ORAL | 0 refills | Status: DC

## 2020-01-30 MED ORDER — POTASSIUM CHLORIDE CRYS ER 20 MEQ PO TBCR: 20.0000 meq | EXTENDED_RELEASE_TABLET | Freq: Two times a day (BID) | ORAL | 0 refills | Status: DC

## 2020-01-30 MED ORDER — LOSARTAN POTASSIUM 100 MG PO TABS: 100.0000 mg | ORAL_TABLET | Freq: Every day | ORAL | 0 refills | Status: DC

## 2020-01-30 MED ORDER — INDAPAMIDE 1.25 MG PO TABS: 1.2500 mg | ORAL_TABLET | Freq: Every day | ORAL | 0 refills | Status: DC

## 2020-02-06 DIAGNOSIS — H401131 Primary open-angle glaucoma, bilateral, mild stage: Secondary | ICD-10-CM | POA: Diagnosis not present

## 2020-02-29 ENCOUNTER — Other Ambulatory Visit: Payer: Self-pay | Admitting: Internal Medicine

## 2020-02-29 DIAGNOSIS — M8949 Other hypertrophic osteoarthropathy, multiple sites: Secondary | ICD-10-CM

## 2020-02-29 DIAGNOSIS — M159 Polyosteoarthritis, unspecified: Secondary | ICD-10-CM

## 2020-02-29 DIAGNOSIS — M5136 Other intervertebral disc degeneration, lumbar region: Secondary | ICD-10-CM

## 2020-02-29 DIAGNOSIS — M4710 Other spondylosis with myelopathy, site unspecified: Secondary | ICD-10-CM

## 2020-03-06 ENCOUNTER — Other Ambulatory Visit: Payer: Self-pay

## 2020-03-06 ENCOUNTER — Ambulatory Visit (INDEPENDENT_AMBULATORY_CARE_PROVIDER_SITE_OTHER): Payer: Medicare Other

## 2020-03-06 DIAGNOSIS — Z23 Encounter for immunization: Secondary | ICD-10-CM | POA: Diagnosis not present

## 2020-03-07 ENCOUNTER — Ambulatory Visit: Payer: Medicare Other | Attending: Internal Medicine

## 2020-03-07 DIAGNOSIS — Z23 Encounter for immunization: Secondary | ICD-10-CM

## 2020-03-07 NOTE — Progress Notes (Signed)
   Covid-19 Vaccination Clinic  Name:  Alexandria [name]    MRN: [id number] DOB: [birth year]  03/07/2020  [name] was observed post Covid-19 immunization for 15 minutes without incident. She was provided with Vaccine Information Sheet and instruction to access the V-Safe system.   [name] was instructed to call 911 with any severe reactions post vaccine: Marland Kitchen Difficulty breathing  . Swelling of face and throat  . A fast heartbeat  . A bad rash all over body  . Dizziness and weakness   Immunizations Administered    Name Date Dose VIS Date Route   Pfizer COVID-19 Vaccine 03/07/2020  1:10 PM 0.3 mL 01/17/2020 Intramuscular   Manufacturer: [place]   Lot: [id number]   [place]: [id number]

## 2020-04-09 ENCOUNTER — Encounter: Payer: Self-pay | Admitting: Internal Medicine

## 2020-04-09 ENCOUNTER — Other Ambulatory Visit: Payer: Self-pay

## 2020-04-09 ENCOUNTER — Ambulatory Visit (INDEPENDENT_AMBULATORY_CARE_PROVIDER_SITE_OTHER): Payer: Medicare Other

## 2020-04-09 ENCOUNTER — Ambulatory Visit (INDEPENDENT_AMBULATORY_CARE_PROVIDER_SITE_OTHER): Payer: Medicare Other | Admitting: Internal Medicine

## 2020-04-09 VITALS — BP 156/82 | HR 63 | Temp 98.3°F | Resp 16 | Ht 66.0 in | Wt 187.0 lb

## 2020-04-09 VITALS — BP 156/82 | HR 63 | Temp 98.3°F | Resp 16 | Ht 66.0 in | Wt 186.9 lb

## 2020-04-09 DIAGNOSIS — G8929 Other chronic pain: Secondary | ICD-10-CM | POA: Diagnosis not present

## 2020-04-09 DIAGNOSIS — D508 Other iron deficiency anemias: Secondary | ICD-10-CM

## 2020-04-09 DIAGNOSIS — Z0000 Encounter for general adult medical examination without abnormal findings: Secondary | ICD-10-CM

## 2020-04-09 DIAGNOSIS — N1831 Chronic kidney disease, stage 3a: Secondary | ICD-10-CM | POA: Diagnosis not present

## 2020-04-09 DIAGNOSIS — I10 Essential (primary) hypertension: Secondary | ICD-10-CM | POA: Diagnosis not present

## 2020-04-09 DIAGNOSIS — M545 Low back pain, unspecified: Secondary | ICD-10-CM | POA: Diagnosis not present

## 2020-04-09 DIAGNOSIS — G4701 Insomnia due to medical condition: Secondary | ICD-10-CM

## 2020-04-09 DIAGNOSIS — E118 Type 2 diabetes mellitus with unspecified complications: Secondary | ICD-10-CM

## 2020-04-09 LAB — URINALYSIS, ROUTINE W REFLEX MICROSCOPIC
Bilirubin Urine: NEGATIVE
Hgb urine dipstick: NEGATIVE
Ketones, ur: NEGATIVE
Leukocytes,Ua: NEGATIVE
Nitrite: NEGATIVE
RBC / HPF: NONE SEEN (ref 0–?)
Specific Gravity, Urine: >=1.03 — AB (ref 1.000–1.030)
Total Protein, Urine: NEGATIVE
Urine Glucose: NEGATIVE
Urobilinogen, UA: 0.2 (ref 0.0–1.0)
pH: 6 (ref 5.0–8.0)

## 2020-04-09 LAB — CBC WITH DIFFERENTIAL/PLATELET
Basophils Absolute: 0 10*3/uL (ref 0.0–0.1)
Basophils Relative: 0.8 % (ref 0.0–3.0)
Eosinophils Absolute: 0.1 10*3/uL (ref 0.0–0.7)
Eosinophils Relative: 3.4 % (ref 0.0–5.0)
HCT: 38.9 % (ref 36.0–46.0)
Hemoglobin: 12.8 g/dL (ref 12.0–15.0)
Lymphocytes Relative: 35.7 % (ref 12.0–46.0)
Lymphs Abs: 1.5 10*3/uL (ref 0.7–4.0)
MCHC: 32.7 g/dL (ref 30.0–36.0)
MCV: 88.9 fl (ref 78.0–100.0)
Monocytes Absolute: 0.4 10*3/uL (ref 0.1–1.0)
Monocytes Relative: 9.2 % (ref 3.0–12.0)
Neutro Abs: 2.1 10*3/uL (ref 1.4–7.7)
Neutrophils Relative %: 50.9 % (ref 43.0–77.0)
Platelets: 233 10*3/uL (ref 150.0–400.0)
RBC: 4.38 Mil/uL (ref 3.87–5.11)
RDW: 15 % (ref 11.5–15.5)
WBC: 4.1 10*3/uL (ref 4.0–10.5)

## 2020-04-09 LAB — BASIC METABOLIC PANEL
BUN: 16 mg/dL (ref 6–23)
CO2: 33 mEq/L — ABNORMAL HIGH (ref 19–32)
Calcium: 9.7 mg/dL (ref 8.4–10.5)
Chloride: 102 mEq/L (ref 96–112)
Creatinine, Ser: 0.93 mg/dL (ref 0.40–1.20)
GFR: 59.12 mL/min — ABNORMAL LOW (ref >60.00)
Glucose, Bld: 92 mg/dL (ref 70–99)
Potassium: 4 mEq/L (ref 3.5–5.1)
Sodium: 139 mEq/L (ref 135–145)

## 2020-04-09 LAB — FERRITIN: Ferritin: 428.2 ng/mL — ABNORMAL HIGH (ref 10.0–291.0)

## 2020-04-09 LAB — HEMOGLOBIN A1C: Hgb A1c MFr Bld: 6.2 % (ref 4.6–6.5)

## 2020-04-09 LAB — IRON: Iron: 66 ug/dL (ref 42–145)

## 2020-04-09 MED ORDER — ESZOPICLONE 2 MG PO TABS: 2.0000 mg | ORAL_TABLET | Freq: Every evening | ORAL | 1 refills | Status: DC | PRN

## 2020-04-09 MED ORDER — CYCLOBENZAPRINE HCL 5 MG PO TABS: 5.0000 mg | ORAL_TABLET | Freq: Three times a day (TID) | ORAL | 0 refills | Status: DC | PRN

## 2020-04-09 NOTE — Progress Notes (Signed)
Subjective:   Alexandria [name] is a 80 y.o. female who presents for Medicare Annual (Subsequent) preventive examination.  Review of Systems    No ROS. Medicare Wellness Visit. Additional risk factors are reflected in social history. Cardiac Risk Factors include: advanced age (>43men, >65 women);dyslipidemia;hypertension;obesity (BMI >30kg/m2)     Objective:    Today's Vitals   04/09/20 1151  BP: (!) 156/82  Pulse: 63  Resp: 16  Temp: 98.3 F (36.8 C)  SpO2: 96%  Weight: 186 lb 15.2 oz (84.8 kg)  Height: 5\' 6"  (1.676 m)  PainSc: 6   PainLoc: Back   Body mass index is 30.17 kg/m.  Advanced Directives 04/09/2020 01/15/2017 10/30/2016  Does Patient Have a Medical Advance Directive? No Yes No  Type of Advance Directive - [place] in Chart? - No - copy requested -  Would patient like information on creating a medical advance directive? No - Patient declined - -    Current Medications (verified) Outpatient Encounter Medications as of 04/09/2020  Medication Sig  . atorvastatin (LIPITOR) 20 MG tablet Take 1 tablet (20 mg total) by mouth daily.  . Bimatoprost (LUMIGAN OP) Apply to eye.  . indapamide (LOZOL) 1.25 MG tablet Take 1 tablet (1.25 mg total) by mouth daily.  . Iron-FA-B Cmp-C-Biot-Probiotic (FUSION PLUS) CAPS Take 1 capsule by mouth daily.  Marland Kitchen losartan (COZAAR) 100 MG tablet Take 1 tablet (100 mg total) by mouth daily.  . nabumetone (RELAFEN) 500 MG tablet TAKE 1 TABLET BY MOUTH TWICE A DAY AS NEEDED  . potassium chloride SA (KLOR-CON) 20 MEQ tablet Take 1 tablet (20 mEq total) by mouth 2 (two) times daily.  . Turmeric 500 MG CAPS Take 1 capsule by mouth daily.   No facility-administered encounter medications on file as of 04/09/2020.    Allergies (verified) Oxycodone-acetaminophen and Tramadol hcl   History: Past Medical History:  Diagnosis Date  . Anemia, iron deficiency   . Cataracts, bilateral   .  Chest pain, unspecified   . Colon polyp 09/2006   adenomatous  . Fibrocystic breast disease   . GERD (gastroesophageal reflux disease)   . Glaucoma, right eye   . Hyperlipidemia   . Hypertension   . LBP (low back pain)   . Osteoarthritis   . Osteopenia   . Seasonal allergies    Past Surgical History:  Procedure Laterality Date  . ABDOMINAL HYSTERECTOMY    . BREAST LUMPECTOMY     RIGHT 22 years ago  . CHOLECYSTECTOMY     Family History  Problem Relation Age of Onset  . Arthritis Other   . Hypertension Other   . Colon cancer Neg Hx   . Esophageal cancer Neg Hx   . Rectal cancer Neg Hx   . Stomach cancer Neg Hx    Social History   Socioeconomic History  . Marital status: Married    Spouse name: Not on file  . Number of children: Not on file  . Years of education: Not on file  . Highest education level: Not on file  Occupational History  . Occupation: Therapist, sports L&D [employer]  Tobacco Use  . Smoking status: Never Smoker  . Smokeless tobacco: Never Used  Substance and Sexual Activity  . Alcohol use: No  . Drug use: No  . Sexual activity: Not Currently    Birth control/protection: Surgical  Other Topics Concern  . Not on file  Social History Narrative  Regular Exercise -  YES   Social Determinants of Health   Financial Resource Strain: Low Risk   . Difficulty of Paying Living Expenses: Not hard at all  Food Insecurity: No Food Insecurity  . Worried About Charity fundraiser in the Last Year: Never true  . Ran Out of Food in the Last Year: Never true  Transportation Needs: No Transportation Needs  . Lack of Transportation (Medical): No  . Lack of Transportation (Non-Medical): No  Physical Activity: Inactive  . Days of Exercise per Week: 0 days  . Minutes of Exercise per Session: 0 min  Stress: No Stress Concern Present  . Feeling of Stress : Not at all  Social Connections: Moderately Integrated  . Frequency of Communication with Friends and Family: More than three  times a week  . Frequency of Social Gatherings with Friends and Family: Once a week  . Attends Religious Services: More than 4 times per year  . Active Member of Clubs or Organizations: No  . Attends Archivist Meetings: More than 4 times per year  . Marital Status: Widowed    Tobacco Counseling Counseling given: No   Clinical Intake:  Pre-visit preparation completed: Yes  Pain : 0-10 Pain Score: 6  Pain Type: Chronic pain Pain Location: Back Pain Orientation: Lower Pain Descriptors / Indicators: Aching,Throbbing,Discomfort Pain Onset: More than a month ago Pain Frequency: Constant Pain Relieving Factors: Flexeril and Nabumetone Effect of Pain on Daily Activities: Pain can diminish job performance, lower motivation to exercise, and prevent you from completing daily tasks. Pain produces disability and affects the quality of life.  Pain Relieving Factors: Flexeril and Nabumetone  BMI - recorded: 30.17 Nutritional Status: BMI > 30  Obese Nutritional Risks: None Diabetes: No  How often do you need to have someone help you when you read instructions, pamphlets, or other written materials from your doctor or pharmacy?: 1 - Never What is the last grade level you completed in school?: Bachelor's Degree  Diabetic? no  Interpreter Needed?: No  Information entered by :: Lisette Abu, LPN   Activities of Daily Living In your present state of health, do you have any difficulty performing the following activities: 04/09/2020  Hearing? N  Vision? N  Difficulty concentrating or making decisions? N  Walking or climbing stairs? N  Dressing or bathing? N  Doing errands, shopping? N  Preparing Food and eating ? N  Using the Toilet? N  In the past six months, have you accidently leaked urine? N  Do you have problems with loss of bowel control? N  Managing your Medications? N  Managing your Finances? N  Housekeeping or managing your Housekeeping? N  Some recent data  might be hidden    Patient Care Team: Janith Lima, MD as PCP - General  Indicate any recent Medical Services you may have received from other than [facility] providers in the past year (date may be approximate).     Assessment:   This is a routine wellness examination for [name].  Hearing/Vision screen No exam data present  Dietary issues and exercise activities discussed: Current Exercise Habits: The patient does not participate in regular exercise at present, Exercise limited by: None identified  Goals    .  Patient Stated (pt-stated)      My goal is to lose at least 15 pounds for this year.      Depression Screen PHQ 2/9 Scores 04/09/2020 07/20/2019 03/11/2018 03/09/2018 01/15/2017 01/12/2017 01/12/2016  PHQ - 2  Score 0 2 0 1 0 0 0  PHQ- 9 Score - 4 - 8 - - -    Fall Risk Fall Risk  04/09/2020 07/20/2019 11/15/2018 03/11/2018 03/09/2018  Falls in the past year? 0 0 0 0 0  Number falls in past yr: 0 0 0 - 0  Injury with Fall? 0 0 0 - 0  Risk for fall due to : No Fall Risks No Fall Risks - - -  Follow up - Falls evaluation completed Falls evaluation completed - Falls evaluation completed    FALL RISK PREVENTION PERTAINING TO THE HOME:  Any stairs in or around the home? Yes  If so, are there any without handrails? No  Home free of loose throw rugs in walkways, pet beds, electrical cords, etc? Yes  Adequate lighting in your home to reduce risk of falls? Yes   ASSISTIVE DEVICES UTILIZED TO PREVENT FALLS:  Life alert? Yes  Use of a cane, walker or w/c? No  Grab bars in the bathroom? No  Shower chair or bench in shower? Yes  Elevated toilet seat or a handicapped toilet? Yes   TIMED UP AND GO:  Was the test performed? No .  Length of time to ambulate 10 feet: 0 sec.   Gait steady and fast without use of assistive device  Cognitive Function: Normal cognitive status assessed by direct observation by this Nurse Health Advisor. No abnormalities found.           Immunizations Immunization History  Administered Date(s) Administered  . Fluad Quad(high Dose 65+) 03/06/2020  . Influenza, High Dose Seasonal PF 01/09/2016, 01/12/2017, 02/19/2018, 02/13/2019  . Influenza,inj,Quad PF,6+ Mos 12/21/2013, 01/09/2015  . PFIZER SARS-COV-2 Vaccination 04/18/2019, 05/09/2019, 03/07/2020  . Pneumococcal Conjugate-13 12/21/2013  . Pneumococcal Polysaccharide-23 10/06/2011, 11/15/2018  . Td 10/17/2009  . Zoster 10/17/2009    TDAP status: Due, Education has been provided regarding the importance of this vaccine. Advised may receive this vaccine at local pharmacy or Health Dept. Aware to provide a copy of the vaccination record if obtained from local pharmacy or Health Dept. Verbalized acceptance and understanding.  Flu Vaccine status: Up to date  Pneumococcal vaccine status: Up to date  Covid-19 vaccine status: Completed vaccines  Qualifies for Shingles Vaccine? Yes   Zostavax completed Yes   Shingrix Completed?: No.    Education has been provided regarding the importance of this vaccine. Patient has been advised to call insurance company to determine out of pocket expense if they have not yet received this vaccine. Advised may also receive vaccine at local pharmacy or Health Dept. Verbalized acceptance and understanding.  Screening Tests Health Maintenance  Topic Date Due  . TETANUS/TDAP  10/18/2019  . OPHTHALMOLOGY EXAM  11/01/2019  . HEMOGLOBIN A1C  04/20/2020  . FOOT EXAM  07/19/2020  . INFLUENZA VACCINE  Completed  . DEXA SCAN  Completed  . COVID-19 Vaccine  Completed  . Hepatitis C Screening  Completed  . PNA vac Low Risk Adult  Completed    Health Maintenance  Health Maintenance Due  Topic Date Due  . TETANUS/TDAP  10/18/2019  . OPHTHALMOLOGY EXAM  11/01/2019    Colorectal cancer screening: No longer required.   Mammogram status: Completed 01/17/2020. Repeat every year  Bone Density status: Completed 08/09/2019. Results reflect:  Bone density results: NORMAL. Repeat every 2 years.  Lung Cancer Screening: (Low Dose CT Chest recommended if Age 66-80 years, 30 pack-year currently smoking OR have quit w/in 15years.) does not qualify.  Lung Cancer Screening Referral: no  Additional Screening:  Hepatitis C Screening: does qualify; Completed yes  Vision Screening: Recommended annual ophthalmology exams for early detection of glaucoma and other disorders of the eye. Is the patient up to date with their annual eye exam?  Yes  Who is the provider or what is the name of the office in which the patient attends annual eye exams? Marylynn Pearson, MD If pt is not established with a provider, would they like to be referred to a provider to establish care? No .   Dental Screening: Recommended annual dental exams for proper oral hygiene  Community Resource Referral / Chronic Care Management: CRR required this visit?  No   CCM required this visit?  No      Plan:     I have personally reviewed and noted the following in the patient's chart:   . Medical and social history . Use of alcohol, tobacco or illicit drugs  . Current medications and supplements . Functional ability and status . Nutritional status . Physical activity . Advanced directives . List of other physicians . Hospitalizations, surgeries, and ER visits in previous 12 months . Vitals . Screenings to include cognitive, depression, and falls . Referrals and appointments  In addition, I have reviewed and discussed with patient certain preventive protocols, quality metrics, and best practice recommendations. A written personalized care plan for preventive services as well as general preventive health recommendations were provided to patient.     Sheral Flow, LPN   [id number]   Nurse Notes: n/a

## 2020-04-09 NOTE — Progress Notes (Unsigned)
Subjective:  Patient ID: Alexandria [name], female    DOB: 12-Jul-1940  Age: 80 y.o. MRN: [id number]  CC: Hypertension and Back Pain  This visit occurred during the SARS-CoV-2 public health emergency.  Safety protocols were in place, including screening questions prior to the visit, additional usage of staff PPE, and extensive cleaning of exam room while observing appropriate contact time as indicated for disinfecting solutions.    HPI Alexandria [name] presents for f/up - She complains of chronic, nonradiating low back pain.  She is getting some symptom relief with the NSAID.  She denies lower extremity paresthesias.  She tells me the pain keeps her awake at night.  She does not want to consider surgery or to take any narcotics to control the pain.  She is active and denies any recent episodes of chest pain, shortness of breath, palpitations, edema, or fatigue.  She tells me her blood pressure has been well controlled.  Outpatient Medications Prior to Visit  Medication Sig Dispense Refill  . atorvastatin (LIPITOR) 20 MG tablet Take 1 tablet (20 mg total) by mouth daily. 90 tablet 0  . Bimatoprost (LUMIGAN OP) Apply to eye.    . indapamide (LOZOL) 1.25 MG tablet Take 1 tablet (1.25 mg total) by mouth daily. 90 tablet 0  . Iron-FA-B Cmp-C-Biot-Probiotic (FUSION PLUS) CAPS Take 1 capsule by mouth daily. 90 capsule 1  . losartan (COZAAR) 100 MG tablet Take 1 tablet (100 mg total) by mouth daily. 90 tablet 0  . nabumetone (RELAFEN) 500 MG tablet TAKE 1 TABLET BY MOUTH TWICE A DAY AS NEEDED 180 tablet 0  . potassium chloride SA (KLOR-CON) 20 MEQ tablet Take 1 tablet (20 mEq total) by mouth 2 (two) times daily. 180 tablet 0  . Turmeric 500 MG CAPS Take 1 capsule by mouth daily. 90 capsule 1   No facility-administered medications prior to visit.    ROS Review of Systems  Constitutional: Negative for chills, diaphoresis, fatigue and fever.  HENT: Negative.   Eyes: Negative for visual disturbance.   Respiratory: Negative for cough, chest tightness, shortness of breath and wheezing.   Cardiovascular: Negative for chest pain, palpitations and leg swelling.  Gastrointestinal: Negative for abdominal pain, constipation, diarrhea, nausea and vomiting.  Endocrine: Negative.  Negative for polydipsia, polyphagia and polyuria.  Genitourinary: Negative.  Negative for difficulty urinating and dysuria.  Musculoskeletal: Positive for back pain. Negative for arthralgias and myalgias.  Skin: Negative for color change, pallor and rash.  Neurological: Negative.  Negative for dizziness and light-headedness.  Hematological: Negative for adenopathy. Does not bruise/bleed easily.  Psychiatric/Behavioral: Positive for sleep disturbance. Negative for behavioral problems. The patient is not nervous/anxious.     Objective:  BP (!) 156/82   Pulse 63   Temp 98.3 F (36.8 C) (Oral)   Resp 16   Ht 5\' 6"  (1.676 m)   Wt 187 lb (84.8 kg)   SpO2 96%   BMI 30.18 kg/m   BP Readings from Last 3 Encounters:  04/09/20 (!) 156/82  04/09/20 (!) 156/82  10/19/19 126/78    Wt Readings from Last 3 Encounters:  04/09/20 186 lb 15.2 oz (84.8 kg)  04/09/20 187 lb (84.8 kg)  10/19/19 178 lb (80.7 kg)    Physical Exam Vitals reviewed.  Constitutional:      Appearance: Normal appearance.  HENT:     Nose: Nose normal.     Mouth/Throat:     Mouth: Mucous membranes are moist.  Eyes:  General: No scleral icterus.    Conjunctiva/sclera: Conjunctivae normal.  Cardiovascular:     Rate and Rhythm: Normal rate and regular rhythm.     Heart sounds: No murmur heard.   Pulmonary:     Effort: Pulmonary effort is normal.     Breath sounds: No stridor. No wheezing, rhonchi or rales.  Abdominal:     General: Abdomen is flat. Bowel sounds are normal. There is no distension.     Palpations: Abdomen is soft. There is no hepatomegaly, splenomegaly or mass.     Tenderness: There is no abdominal tenderness.   Musculoskeletal:        General: Normal range of motion.     Cervical back: Neck supple.     Right lower leg: No edema.     Left lower leg: No edema.  Lymphadenopathy:     Cervical: No cervical adenopathy.  Skin:    General: Skin is warm and dry.     Coloration: Skin is not pale.  Neurological:     General: No focal deficit present.     Mental Status: She is alert. Mental status is at baseline.     Lab Results  Component Value Date   WBC 4.1 04/09/2020   HGB 12.8 04/09/2020   HCT 38.9 04/09/2020   PLT 233.0 04/09/2020   GLUCOSE 92 04/09/2020   CHOL 143 07/20/2019   TRIG 96.0 07/20/2019   HDL 64.90 07/20/2019   LDLCALC 59 07/20/2019   ALT 15 07/20/2019   AST 17 07/20/2019   NA 139 04/09/2020   K 4.0 04/09/2020   CL 102 04/09/2020   CREATININE 0.93 04/09/2020   BUN 16 04/09/2020   CO2 33 (H) 04/09/2020   TSH 0.98 07/20/2019   HGBA1C 6.2 04/09/2020   MICROALBUR 1.1 07/20/2019    MM 3D SCREEN BREAST BILATERAL  Result Date: 01/17/2020 CLINICAL DATA:  Screening. EXAM: DIGITAL SCREENING BILATERAL MAMMOGRAM WITH TOMO AND CAD COMPARISON:  Previous exam(s). ACR Breast Density Category c: The breast tissue is heterogeneously dense, which may obscure small masses. FINDINGS: There are no findings suspicious for malignancy. Images were processed with CAD. IMPRESSION: No mammographic evidence of malignancy. A result letter of this screening mammogram will be mailed directly to the patient. RECOMMENDATION: Screening mammogram in one year. (Code:[id number]-B-01Y) BI-RADS CATEGORY  1: Negative. Electronically Signed   By: Kristopher Oppenheim M.D.   On: 01/17/2020 16:13    Assessment & Plan:   Alexandria [name] was seen today for hypertension and back pain.  Diagnoses and all orders for this visit:  Type II diabetes mellitus with manifestations ([place])- Her blood sugar is adequately well controlled. -     Basic metabolic panel; Future -     Hemoglobin A1c; Future -     Hemoglobin A1c -     Basic  metabolic panel  Essential hypertension- Her blood pressure is adequately well controlled. -     CBC with Differential/Platelet; Future -     Basic metabolic panel; Future -     Urinalysis, Routine w reflex microscopic; Future -     Urinalysis, Routine w reflex microscopic -     Basic metabolic panel -     CBC with Differential/Platelet  Stage 3a chronic kidney disease ([place])- Her renal function is stable.  Will continue to maintain control of the blood pressure. -     Basic metabolic panel; Future -     Urinalysis, Routine w reflex microscopic; Future -     Urinalysis, Routine w  reflex microscopic -     Basic metabolic panel  Iron deficiency anemia secondary to inadequate dietary iron intake- Her H&H are normal now. -     CBC with Differential/Platelet; Future -     Iron; Future -     Ferritin; Future -     Ferritin -     Iron -     CBC with Differential/Platelet  Chronic bilateral low back pain without sciatica -     Ambulatory referral to Sports Medicine -     cyclobenzaprine (FLEXERIL) 5 MG tablet; Take 1 tablet (5 mg total) by mouth 3 (three) times daily as needed for muscle spasms.  Insomnia secondary to chronic pain -     eszopiclone (LUNESTA) 2 MG TABS tablet; Take 1 tablet (2 mg total) by mouth at bedtime as needed for sleep. Take immediately before bedtime   I am having Alexandria [name] start on cyclobenzaprine and eszopiclone. I am also having her maintain her Bimatoprost (LUMIGAN OP), Turmeric, Fusion Plus, atorvastatin, losartan, indapamide, potassium chloride SA, and nabumetone.  Meds ordered this encounter  Medications  . cyclobenzaprine (FLEXERIL) 5 MG tablet    Sig: Take 1 tablet (5 mg total) by mouth 3 (three) times daily as needed for muscle spasms.    Dispense:  270 tablet    Refill:  0  . eszopiclone (LUNESTA) 2 MG TABS tablet    Sig: Take 1 tablet (2 mg total) by mouth at bedtime as needed for sleep. Take immediately before bedtime    Dispense:  90 tablet     Refill:  1     Follow-up: Return in about 3 months (around 07/08/2020).  Scarlette Calico, MD

## 2020-04-09 NOTE — Patient Instructions (Signed)

## 2020-04-09 NOTE — Patient Instructions (Signed)
Alexandria [name] , Thank you for taking time to come for your Medicare Wellness Visit. I appreciate your ongoing commitment to your health goals. Please review the following plan we discussed and let me know if I can assist you in the future.   Screening recommendations/referrals: Colonoscopy: no repeat due to age 80: 01/17/2020 Bone Density: 08/09/2019 Recommended yearly ophthalmology/optometry visit for glaucoma screening and checkup Recommended yearly dental visit for hygiene and checkup  Vaccinations: Influenza vaccine: 03/06/2020 Pneumococcal vaccine: up to date Tdap vaccine: 10/17/2009; overdue Shingles vaccine: never done   Covid-19: up to date  Advanced directives: Advance directive discussed with you today. Even though you declined this today please call our office should you change your mind and we can give you the proper paperwork for you to fill out.  Conditions/risks identified: Yes; Reviewed health maintenance screenings with patient today and relevant education, vaccines, and/or referrals were provided. Please continue to do your personal lifestyle choices by: daily care of teeth and gums, regular physical activity (goal should be 5 days a week for 30 minutes), eat a healthy diet, avoid tobacco and drug use, limiting any alcohol intake, taking a low-dose aspirin (if not allergic or have been advised by your provider otherwise) and taking vitamins and minerals as recommended by your provider. Continue doing brain stimulating activities (puzzles, reading, adult coloring books, staying active) to keep memory sharp. Continue to eat heart healthy diet (full of fruits, vegetables, whole grains, lean protein, water--limit salt, fat, and sugar intake) and increase physical activity as tolerated.  Next appointment: Please schedule your next Medicare Wellness Visit with your Nurse Health Advisor in 1 year.  Preventive Care 76 Years and Older, Female Preventive care refers to lifestyle  choices and visits with your health care provider that can promote health and wellness. What does preventive care include?  A yearly physical exam. This is also called an annual well check.  Dental exams once or twice a year.  Routine eye exams. Ask your health care provider how often you should have your eyes checked.  Personal lifestyle choices, including:  Daily care of your teeth and gums.  Regular physical activity.  Eating a healthy diet.  Avoiding tobacco and drug use.  Limiting alcohol use.  Practicing safe sex.  Taking low-dose aspirin every day.  Taking vitamin and mineral supplements as recommended by your health care provider. What happens during an annual well check? The services and screenings done by your health care provider during your annual well check will depend on your age, overall health, lifestyle risk factors, and family history of disease. Counseling  Your health care provider may ask you questions about your:  Alcohol use.  Tobacco use.  Drug use.  Emotional well-being.  Home and relationship well-being.  Sexual activity.  Eating habits.  History of falls.  Memory and ability to understand (cognition).  Work and work Statistician.  Reproductive health. Screening  You may have the following tests or measurements:  Height, weight, and BMI.  Blood pressure.  Lipid and cholesterol levels. These may be checked every 5 years, or more frequently if you are over 20 years old.  Skin check.  Lung cancer screening. You may have this screening every year starting at age 31 if you have a 30-pack-year history of smoking and currently smoke or have quit within the past 15 years.  Fecal occult blood test (FOBT) of the stool. You may have this test every year starting at age 50.  Flexible sigmoidoscopy or colonoscopy.  You may have a sigmoidoscopy every 5 years or a colonoscopy every 10 years starting at age 47.  Hepatitis C blood  test.  Hepatitis B blood test.  Sexually transmitted disease (STD) testing.  Diabetes screening. This is done by checking your blood sugar (glucose) after you have not eaten for a while (fasting). You may have this done every 1-3 years.  Bone density scan. This is done to screen for osteoporosis. You may have this done starting at age 81.  Mammogram. This may be done every 1-2 years. Talk to your health care provider about how often you should have regular mammograms. Talk with your health care provider about your test results, treatment options, and if necessary, the need for more tests. Vaccines  Your health care provider may recommend certain vaccines, such as:  Influenza vaccine. This is recommended every year.  Tetanus, diphtheria, and acellular pertussis (Tdap, Td) vaccine. You may need a Td booster every 10 years.  Zoster vaccine. You may need this after age 71.  Pneumococcal 13-valent conjugate (PCV13) vaccine. One dose is recommended after age 62.  Pneumococcal polysaccharide (PPSV23) vaccine. One dose is recommended after age 27. Talk to your health care provider about which screenings and vaccines you need and how often you need them. This information is not intended to replace advice given to you by your health care provider. Make sure you discuss any questions you have with your health care provider. Document Released: 04/12/2015 Document Revised: 12/04/2015 Document Reviewed: 01/15/2015 Elsevier Interactive Patient Education  2017 [place] Prevention in the Home Falls can cause injuries. They can happen to people of all ages. There are many things you can do to make your home safe and to help prevent falls. What can I do on the outside of my home?  Regularly fix the edges of walkways and driveways and fix any cracks.  Remove anything that might make you trip as you walk through a door, such as a raised step or threshold.  Trim any bushes or trees on the  path to your home.  Use bright outdoor lighting.  Clear any walking paths of anything that might make someone trip, such as rocks or tools.  Regularly check to see if handrails are loose or broken. Make sure that both sides of any steps have handrails.  Any raised decks and porches should have guardrails on the edges.  Have any leaves, snow, or ice cleared regularly.  Use sand or salt on walking paths during winter.  Clean up any spills in your garage right away. This includes oil or grease spills. What can I do in the bathroom?  Use night lights.  Install grab bars by the toilet and in the tub and shower. Do not use towel bars as grab bars.  Use non-skid mats or decals in the tub or shower.  If you need to sit down in the shower, use a plastic, non-slip stool.  Keep the floor dry. Clean up any water that spills on the floor as soon as it happens.  Remove soap buildup in the tub or shower regularly.  Attach bath mats securely with double-sided non-slip rug tape.  Do not have throw rugs and other things on the floor that can make you trip. What can I do in the bedroom?  Use night lights.  Make sure that you have a light by your bed that is easy to reach.  Do not use any sheets or blankets that are too big  for your bed. They should not hang down onto the floor.  Have a firm chair that has side arms. You can use this for support while you get dressed.  Do not have throw rugs and other things on the floor that can make you trip. What can I do in the kitchen?  Clean up any spills right away.  Avoid walking on wet floors.  Keep items that you use a lot in easy-to-reach places.  If you need to reach something above you, use a strong step stool that has a grab bar.  Keep electrical cords out of the way.  Do not use floor polish or wax that makes floors slippery. If you must use wax, use non-skid floor wax.  Do not have throw rugs and other things on the floor that can  make you trip. What can I do with my stairs?  Do not leave any items on the stairs.  Make sure that there are handrails on both sides of the stairs and use them. Fix handrails that are broken or loose. Make sure that handrails are as long as the stairways.  Check any carpeting to make sure that it is firmly attached to the stairs. Fix any carpet that is loose or worn.  Avoid having throw rugs at the top or bottom of the stairs. If you do have throw rugs, attach them to the floor with carpet tape.  Make sure that you have a light switch at the top of the stairs and the bottom of the stairs. If you do not have them, ask someone to add them for you. What else can I do to help prevent falls?  Wear shoes that:  Do not have high heels.  Have rubber bottoms.  Are comfortable and fit you well.  Are closed at the toe. Do not wear sandals.  If you use a stepladder:  Make sure that it is fully opened. Do not climb a closed stepladder.  Make sure that both sides of the stepladder are locked into place.  Ask someone to hold it for you, if possible.  Clearly mark and make sure that you can see:  Any grab bars or handrails.  First and last steps.  Where the edge of each step is.  Use tools that help you move around (mobility aids) if they are needed. These include:  Canes.  Walkers.  Scooters.  Crutches.  Turn on the lights when you go into a dark area. Replace any light bulbs as soon as they burn out.  Set up your furniture so you have a clear path. Avoid moving your furniture around.  If any of your floors are uneven, fix them.  If there are any pets around you, be aware of where they are.  Review your medicines with your doctor. Some medicines can make you feel dizzy. This can increase your chance of falling. Ask your doctor what other things that you can do to help prevent falls. This information is not intended to replace advice given to you by your health care  provider. Make sure you discuss any questions you have with your health care provider. Document Released: 01/10/2009 Document Revised: 08/22/2015 Document Reviewed: 04/20/2014 Elsevier Interactive Patient Education  2017 [employer].

## 2020-04-16 ENCOUNTER — Ambulatory Visit: Payer: Medicare Other | Admitting: Family Medicine

## 2020-04-19 ENCOUNTER — Telehealth: Payer: Self-pay | Admitting: Internal Medicine

## 2020-04-19 ENCOUNTER — Other Ambulatory Visit: Payer: Self-pay | Admitting: Internal Medicine

## 2020-04-19 DIAGNOSIS — I10 Essential (primary) hypertension: Secondary | ICD-10-CM

## 2020-04-19 DIAGNOSIS — E785 Hyperlipidemia, unspecified: Secondary | ICD-10-CM

## 2020-04-19 DIAGNOSIS — E118 Type 2 diabetes mellitus with unspecified complications: Secondary | ICD-10-CM

## 2020-04-19 NOTE — Telephone Encounter (Signed)
Patient is requesting an updated medications list be sent to  [facility] [place], [place] - [address] Phone:  [phone]  Fax:  [phone]

## 2020-04-22 NOTE — Progress Notes (Signed)
Subjective:   I, Wendy Poet, LAT, ATC, am serving as scribe for Dr. Lynne Leader.  I'm seeing this patient as a consultation for Dr. Scarlette Calico. Note will be routed back to referring provider/PCP.  CC: Chronic low back pain  HPI: Pt is a 80 y/o female c/o chronic LBP that's been ongoing for years. Pt locates pain to her midline low back.  She notes pain does radiate to her peritoneum area.  She denies any bowel or bladder dysfunction or pain radiating down her legs.  No weakness or numbness.  She feels well otherwise.  No fevers or chills.  Radiating pn: yes into her pelvis and hips LE numbness/tingling: no LE weakness: No Aggravates: most painful at night; lifting things from the waist up Rx tried: cyclobenzaprine, Tylenol; NSAIDs; prior ESI; prior PT  Diagnostic testing: L-spine XR- 04/23/99  Past medical history, Surgical history, Family history, Social history, Allergies, and medications have been entered into the medical record, reviewed.   Review of Systems: No new headache, visual changes, nausea, vomiting, diarrhea, constipation, dizziness, abdominal pain, skin rash, fevers, chills, night sweats, weight loss, swollen lymph nodes, body aches, joint swelling, muscle aches, chest pain, shortness of breath, mood changes, visual or auditory hallucinations.   Objective:    Vitals:   04/23/20 1247  BP: 138/62  Pulse: 70  SpO2: 97%   General: Well Developed, well nourished, and in no acute distress.  Neuro/Psych: Alert and oriented x3, extra-ocular muscles intact, able to move all 4 extremities, sensation grossly intact. Skin: Warm and dry, no rashes noted.  Respiratory: Not using accessory muscles, speaking in full sentences, trachea midline.  Cardiovascular: Pulses palpable, no extremity edema. Abdomen: Does not appear distended. MSK: L-spine: Normal-appearing Nontender midline. Tender palpation left lumbar paraspinal musculature. Extremity strength reflexes and sensation  are equal normal throughout. Hips bilaterally normal motion normal strength.  Lab and Radiology Results  X-ray images L-spine and pelvis obtained today personally and independently interpreted.  L-spine: Diffuse degenerative disc disease worse at L5-S1.  No fractures.  Pelvis: No fractures or severe degeneration.  Await formal radiology review  Impression and Recommendations:    Assessment and Plan: 80 y.o. female with chronic low back pain with possible sacral radiculopathy.  Plan for physical therapy.  Also prescribed low-dose gabapentin for use at bedtime.  Physical therapy should help improve her situation but would not resolve her chronic pain.  If not improving will proceed to MRI to further characterize pain generators and plan for potential injections.  Explained that the goals for chronic pain management for low back pain are to improve function and minimize pain but generally not to resolve pain.  Recheck 6 to 8 weeks.Marland Kitchen  PDMP not reviewed this encounter. Orders Placed This Encounter  Procedures  . DG Lumbar Spine 2-3 Views    Standing Status:   Future    Number of Occurrences:   1    Standing Expiration Date:   04/23/2021    Order Specific Question:   Reason for Exam (SYMPTOM  OR DIAGNOSIS REQUIRED)    Answer:   eval chronic low back pain    Order Specific Question:   Preferred imaging location?    Answer:   [facility]  . DG Pelvis 1-2 Views    Standing Status:   Future    Number of Occurrences:   1    Standing Expiration Date:   04/23/2021    Order Specific Question:   Reason for Exam (SYMPTOM  OR DIAGNOSIS REQUIRED)    Answer:   eval pelvis pain    Order Specific Question:   Preferred imaging location?    Answer:   [facility]  . Ambulatory referral to Physical Therapy    Referral Priority:   Routine    Referral Type:   Physical Medicine    Referral Reason:   Specialty Services Required    Requested Specialty:   Physical Therapy   Meds ordered  this encounter  Medications  . gabapentin (NEURONTIN) 100 MG capsule    Sig: Take 1-3 capsules (100-300 mg total) by mouth 3 (three) times daily as needed (nerve pain).    Dispense:  90 capsule    Refill:  3    Discussed warning signs or symptoms. Please see discharge instructions. Patient expresses understanding.   The above documentation has been reviewed and is accurate and complete Lynne Leader, M.D.

## 2020-04-22 NOTE — Telephone Encounter (Signed)
Updated med list has been faxed

## 2020-04-23 ENCOUNTER — Encounter: Payer: Self-pay | Admitting: Family Medicine

## 2020-04-23 ENCOUNTER — Ambulatory Visit (INDEPENDENT_AMBULATORY_CARE_PROVIDER_SITE_OTHER): Payer: Medicare Other | Admitting: Family Medicine

## 2020-04-23 ENCOUNTER — Ambulatory Visit (INDEPENDENT_AMBULATORY_CARE_PROVIDER_SITE_OTHER): Payer: Medicare Other

## 2020-04-23 ENCOUNTER — Other Ambulatory Visit: Payer: Self-pay

## 2020-04-23 VITALS — BP 138/62 | HR 70 | Ht 66.0 in | Wt 185.6 lb

## 2020-04-23 DIAGNOSIS — M47817 Spondylosis without myelopathy or radiculopathy, lumbosacral region: Secondary | ICD-10-CM | POA: Diagnosis not present

## 2020-04-23 DIAGNOSIS — M545 Low back pain, unspecified: Secondary | ICD-10-CM | POA: Diagnosis not present

## 2020-04-23 DIAGNOSIS — M5136 Other intervertebral disc degeneration, lumbar region: Secondary | ICD-10-CM

## 2020-04-23 DIAGNOSIS — G8929 Other chronic pain: Secondary | ICD-10-CM | POA: Diagnosis not present

## 2020-04-23 DIAGNOSIS — M51369 Other intervertebral disc degeneration, lumbar region without mention of lumbar back pain or lower extremity pain: Secondary | ICD-10-CM

## 2020-04-23 MED ORDER — GABAPENTIN 100 MG PO CAPS: 100.0000 mg | ORAL_CAPSULE | Freq: Three times a day (TID) | ORAL | 3 refills | Status: DC | PRN

## 2020-04-23 NOTE — Patient Instructions (Addendum)
Thank you for coming in today.  I've referred you to Physical Therapy.  Let us know if you don't hear from them in one week.  Please get an Xray today before you leave  Try gabapentin mostly at bedtime as needed for that nerve pain.   Recheck with me in following PT course in about 8 weeks.   Return sooner or contact me sooner if you are not doing well.

## 2020-04-24 NOTE — Progress Notes (Signed)
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

## 2020-04-24 NOTE — Progress Notes (Signed)
X-ray lumbar spine shows multilevel arthritis.  This is worse at L4-5 and L5-S1.  Plan for physical therapy.

## 2020-05-08 ENCOUNTER — Other Ambulatory Visit: Payer: Self-pay

## 2020-05-08 ENCOUNTER — Encounter: Payer: Self-pay | Admitting: Rehabilitative and Restorative Service Providers"

## 2020-05-08 ENCOUNTER — Ambulatory Visit (INDEPENDENT_AMBULATORY_CARE_PROVIDER_SITE_OTHER): Payer: Medicare Other | Admitting: Rehabilitative and Restorative Service Providers"

## 2020-05-08 DIAGNOSIS — G8929 Other chronic pain: Secondary | ICD-10-CM

## 2020-05-08 DIAGNOSIS — M545 Low back pain, unspecified: Secondary | ICD-10-CM | POA: Diagnosis not present

## 2020-05-08 NOTE — Therapy (Signed)
[place], [place], [address] Phone: [phone]   Fax:  [phone]  Physical Therapy Evaluation  Patient Details  Name: Alexandria [name] MRN: [id number] Date of Birth: 12/04/1940 Referring Provider (PT): Dr. Lynne Leader   Encounter Date: 05/08/2020   PT End of Session - 05/08/20 0841    Visit Number 1    Number of Visits 16    Date for PT Re-Evaluation 07/17/20    Authorization Type Medicare/VA    Progress Note Due on Visit 10    PT Start Time 0845    PT Stop Time 0915    PT Time Calculation (min) 30 min    Activity Tolerance Patient tolerated treatment well    Behavior During Therapy [facility] for tasks assessed/performed           Past Medical History:  Diagnosis Date  . Anemia, iron deficiency   . Cataracts, bilateral   . Chest pain, unspecified   . Colon polyp 09/2006   adenomatous  . Fibrocystic breast disease   . GERD (gastroesophageal reflux disease)   . Glaucoma, right eye   . Hyperlipidemia   . Hypertension   . LBP (low back pain)   . Osteoarthritis   . Osteopenia   . Seasonal allergies     Past Surgical History:  Procedure Laterality Date  . ABDOMINAL HYSTERECTOMY    . BREAST LUMPECTOMY     RIGHT 22 years ago  . CHOLECYSTECTOMY      There were no vitals filed for this visit.    Subjective Assessment - 05/08/20 0844    Subjective Chronic low back pain "for years". Pain in midline low back, denied bowel or bladder symptoms or LE radicular symptoms.  Has treated with OTC medicine along the way with some muscle relaxers as well.  Spasms create lots of trouble at times.    Limitations Lifting;Standing;Walking;House hold activities    Patient Stated Goals Reduce pain    Currently in Pain? Yes    Pain Score 3    at worst 7/10   Pain Location Back    Pain Orientation Lower    Pain Descriptors / Indicators Spasm;Sharp;Nagging;Aching    Pain Type Chronic pain    Pain Radiating Towards perineal     Pain Onset More than a month ago    Pain Frequency Intermittent    Aggravating Factors  painful at night, lifting things, bending, reaching overhead/lifting    Pain Relieving Factors medicine at times    Effect of Pain on Daily Activities Limited in bending, lifting, sleeping              [facility] PT Assessment - 05/08/20 0001      Assessment   Medical Diagnosis DDD lumbar    Referring Provider (PT) Dr. Lynne Leader    Onset Date/Surgical Date 03/30/20    Hand Dominance Right      Precautions   Precautions None      Restrictions   Weight Bearing Restrictions No      Balance Screen   Has the patient fallen in the past 6 months No    Has the patient had a decrease in activity level because of a fear of falling?  No    Is the patient reluctant to leave their home because of a fear of falling?  No      Home Environment   Living Environment Private residence    Additional Comments Has stairs but has ramp to enter.  Second floor stairs but bedroom on first floor      Prior Function   Level of Independence Independent    Vocation Requirements Retired, Psychologist, occupational at center with group activity (sewing)    [employer], senior center exercise classes prior to [employer]      Observation/Other Assessments   Focus on Therapeutic Outcomes (FOTO)  intake 47%      Posture/Postural Control   Posture Comments Mild reduction lumbar lordosis, no obvious lateral shift noted, equal iliac crest      ROM / Strength   AROM / PROM / Strength Strength;PROM;AROM      AROM   AROM Assessment Site Lumbar    Lumbar Flexion to ankles ERP, x 3: still present, gower sign upon return    Lumbar Extension 50%      PROM   PROM Assessment Site Lumbar      Strength   Strength Assessment Site Hip;Knee;Ankle    Right/Left Hip Right;Left    Right Hip Flexion 5/5    Left Hip Flexion 5/5    Right/Left Knee Left;Right    Right Knee Flexion 5/5    Right Knee Extension 5/5    Left Knee Flexion 5/5    Left  Knee Extension 5/5    Right/Left Ankle Left;Right    Right Ankle Dorsiflexion 5/5    Left Ankle Dorsiflexion 5/5      Special Tests   Other special tests passive SLR bilateral to 90 deg no back complaints, negative crossed SLR, negative slump                      Objective measurements completed on examination: See above findings.       [facility] Adult PT Treatment/Exercise - 05/08/20 0001      Exercises   Exercises Lumbar;Other Exercises    Other Exercises  HEP instruction/performance c cues and trial set for lumbar extension x 10 standing, supine LTR 15 sec x 3 each side, supine bridge x 10, SKC 15 sec x 3 bilateral                  PT Education - 05/08/20 0844    Education Details HEP, POC    Person(s) Educated Patient    Methods Explanation;Demonstration;Verbal cues;Handout    Comprehension Returned demonstration;Verbalized understanding            PT Short Term Goals - 05/08/20 0841      PT SHORT TERM GOAL #1   Title Patient will demonstrate independent use of home exercise program to maintain progress from in clinic treatments.    Time 3    Period Weeks    Status New    Target Date 05/29/20             PT Long Term Goals - 05/08/20 0842      PT LONG TERM GOAL #1   Title Patient will demonstrate/report pain at worst less than or equal to 2/10 to facilitate minimal limitation in daily activity secondary to pain symptoms.    Time 10    Period Weeks    Status New    Target Date 07/17/20      PT LONG TERM GOAL #2   Title Patient will demonstrate independent use of home exercise program to facilitate ability to maintain/progress functional gains from skilled physical therapy services.    Time 10    Period Weeks    Status New    Target Date 07/17/20  PT LONG TERM GOAL #3   Title Pt. will demonstrate FOTO update 56 or greater to indicated reduction in disabiity due to condition.    Time 10    Period Weeks    Status New    Target  Date 07/17/20      PT LONG TERM GOAL #4   Title Pt. will demonstrate ability to flexion lumbar to floor to facilitate bending for shoe/sock don/doff.    Time 10    Period Weeks    Status New    Target Date 07/17/20      PT LONG TERM GOAL #5   Title Pt. will demonstrate lumbar extension 100 % WFL to facilitate standing/walking posture at PLOF.    Time 10    Period Weeks    Status New    Target Date 07/17/20                  Plan - 05/08/20 0842    Clinical Impression Statement Patient is a 80 y.o. female who comes to clinic with complaints of low back pain with mobility deficits that impair their ability to perform usual daily and recreational functional activities without increase difficulty/symptoms at this time.  Patient to benefit from skilled PT services to address impairments and limitations to improve to previous level of function without restriction secondary to condition.    Personal Factors and Comorbidities Comorbidity 3+    Comorbidities Hyperlipidemia, OA, osteopenia, HTN, GERD    Examination-Activity Limitations Sleep;Sit;Squat;Bend;Carry;Stand;Lift    Examination-Participation Restrictions Community Activity;Cleaning;Shop;Laundry;Meal Prep    Stability/Clinical Decision Making Stable/Uncomplicated    Clinical Decision Making Low    PT Frequency 2x / week    PT Duration Other (comment)   10 weeks   PT Treatment/Interventions ADLs/Self Care Home Management;Electrical Stimulation;Cryotherapy;Iontophoresis 4mg /ml Dexamethasone;Moist Heat;Traction;Balance training;Therapeutic exercise;Therapeutic activities;Functional mobility training;Stair training;Gait training;DME Instruction;Ultrasound;Neuromuscular re-education;Patient/family education;Manual techniques;Passive range of motion;Dry needling;Taping;Joint Manipulations;Spinal Manipulations    PT Next Visit Plan Reassess HEP, promote improved lumbar mobility as well as hip/core stabilization to improve activity  tolerance.    PT Home Exercise Plan [email or web address]    Consulted and Agree with Plan of Care Patient           Patient will benefit from skilled therapeutic intervention in order to improve the following deficits and impairments:  Hypomobility,Decreased activity tolerance,Decreased strength,Pain,Postural dysfunction,Decreased range of motion,Improper body mechanics,Impaired perceived functional ability,Impaired flexibility,Difficulty walking,Decreased mobility  Visit Diagnosis: Chronic midline low back pain without sciatica     Problem List Patient Active Problem List   Diagnosis Date Noted  . Chronic bilateral low back pain without sciatica 04/09/2020  . Insomnia secondary to chronic pain 04/09/2020  . Stage 3a chronic kidney disease ([place]) 10/20/2019  . Vertigo 05/17/2018  . Intractable episodic headache 05/27/2017  . Primary osteoarthritis involving multiple joints 01/09/2016  . Gastroesophageal reflux disease with esophagitis 12/21/2013  . Vitamin D deficiency 10/30/2012  . Obesity (BMI 30.0-34.9) 10/28/2012  . Degenerative disc disease, lumbar 11/24/2011  . Type II diabetes mellitus with manifestations ([place]) 10/06/2011  . Routine general medical examination at a health care facility 10/06/2011  . Glaucoma 07/23/2008  . FIBROCYSTIC BREAST DISEASE 07/23/2008  . Hyperlipidemia with target LDL less than 100 04/19/2008  . Iron deficiency anemia 04/19/2008  . Essential hypertension 04/19/2008  . OA (osteoarthritis of spine) 04/19/2008  . OSTEOPENIA 04/19/2008    Scot Jun, PT, DPT, OCS, ATC 05/08/20  9:24 AM    [facility] Physical Therapy [address] [place], [place], [address] Phone:  [phone]   Fax:  [phone]  Name: STACI DACK MRN: [id number] Date of Birth: 1940/06/17

## 2020-05-08 NOTE — Patient Instructions (Signed)
Access Code: [email or web address] URL: [email or web address] Date: 05/08/2020 Prepared by: Scot Jun  Exercises Supine Lower Trunk Rotation - 2 x daily - 7 x weekly - 1 sets - 5 reps - 15 hold Supine Bridge - 2 x daily - 7 x weekly - 3 sets - 10 reps - 2 hold Supine Single Knee to Chest Stretch - 2 x daily - 7 x weekly - 1 sets - 5 reps - 15 hold Standing Lumbar Extension at Wall - Forearms - 1 x daily - 7 x weekly - 2 sets - 10 reps

## 2020-05-13 ENCOUNTER — Encounter: Payer: Self-pay | Admitting: Physical Therapy

## 2020-05-13 ENCOUNTER — Ambulatory Visit (INDEPENDENT_AMBULATORY_CARE_PROVIDER_SITE_OTHER): Payer: Medicare Other | Admitting: Physical Therapy

## 2020-05-13 ENCOUNTER — Other Ambulatory Visit: Payer: Self-pay

## 2020-05-13 DIAGNOSIS — M545 Low back pain, unspecified: Secondary | ICD-10-CM | POA: Diagnosis not present

## 2020-05-13 DIAGNOSIS — G8929 Other chronic pain: Secondary | ICD-10-CM

## 2020-05-13 NOTE — Therapy (Signed)
[place], [place], [address] Phone: [phone]   Fax:  [phone]  Physical Therapy Treatment  Patient Details  Name: Alexandria [name] MRN: [id number] Date of Birth: [birth year] Referring Provider (PT): Dr. Lynne Leader   Encounter Date: 05/13/2020   PT End of Session - 05/13/20 0840    Visit Number 2    Number of Visits 16    Date for PT Re-Evaluation 07/17/20    Authorization Type Medicare/VA    Progress Note Due on Visit 10    PT Start Time 0802    PT Stop Time 0840    PT Time Calculation (min) 38 min    Activity Tolerance Patient tolerated treatment well    Behavior During Therapy [facility] for tasks assessed/performed           Past Medical History:  Diagnosis Date  . Anemia, iron deficiency   . Cataracts, bilateral   . Chest pain, unspecified   . Colon polyp 09/2006   adenomatous  . Fibrocystic breast disease   . GERD (gastroesophageal reflux disease)   . Glaucoma, right eye   . Hyperlipidemia   . Hypertension   . LBP (low back pain)   . Osteoarthritis   . Osteopenia   . Seasonal allergies     Past Surgical History:  Procedure Laterality Date  . ABDOMINAL HYSTERECTOMY    . BREAST LUMPECTOMY     RIGHT 22 years ago  . CHOLECYSTECTOMY      There were no vitals filed for this visit.   Subjective Assessment - 05/13/20 0802    Subjective back is about the same; did "some" exercises in the beginning, then has been out of town for 4 days for a funeral.    Limitations Lifting;Standing;Walking;House hold activities    Patient Stated Goals Reduce pain    Currently in Pain? Yes    Pain Score 3     Pain Location Back    Pain Orientation Lower    Pain Descriptors / Indicators Aching;Nagging;Spasm;Sharp    Pain Type Chronic pain    Pain Onset More than a month ago    Pain Frequency Intermittent    Aggravating Factors  worse at night, lifting things, bending, reaching overhead/lifting    Pain Relieving  Factors medicine at times                             [facility] Adult PT Treatment/Exercise - 05/13/20 0808      Lumbar Exercises: Stretches   Single Knee to Chest Stretch Right;Left;3 reps;20 seconds    Lower Trunk Rotation 5 reps   15 sec   Standing Extension 10 reps;5 seconds    Standing Extension Limitations forearms on wall      Lumbar Exercises: Aerobic   Nustep L5 x 8 min      Lumbar Exercises: Supine   Bridge 20 reps;5 seconds                    PT Short Term Goals - 05/13/20 0841      PT SHORT TERM GOAL #1   Title Patient will demonstrate independent use of home exercise program to maintain progress from in clinic treatments.    Baseline 2/14: min cues    Time 3    Period Weeks    Status On-going    Target Date 05/29/20  PT Long Term Goals - 05/08/20 3244      PT LONG TERM GOAL #1   Title Patient will demonstrate/report pain at worst less than or equal to 2/10 to facilitate minimal limitation in daily activity secondary to pain symptoms.    Time 10    Period Weeks    Status New    Target Date 07/17/20      PT LONG TERM GOAL #2   Title Patient will demonstrate independent use of home exercise program to facilitate ability to maintain/progress functional gains from skilled physical therapy services.    Time 10    Period Weeks    Status New    Target Date 07/17/20      PT LONG TERM GOAL #3   Title Pt. will demonstrate FOTO update 56 or greater to indicated reduction in disabiity due to condition.    Time 10    Period Weeks    Status New    Target Date 07/17/20      PT LONG TERM GOAL #4   Title Pt. will demonstrate ability to flexion lumbar to floor to facilitate bending for shoe/sock don/doff.    Time 10    Period Weeks    Status New    Target Date 07/17/20      PT LONG TERM GOAL #5   Title Pt. will demonstrate lumbar extension 100 % WFL to facilitate standing/walking posture at PLOF.    Time 10    Period  Weeks    Status New    Target Date 07/17/20                 Plan - 05/13/20 0841    Clinical Impression Statement Session today focused on HEP review with min cues needed.  All goals ongoing at this time.  Will continue to benefit from PT to maximize function.    Personal Factors and Comorbidities Comorbidity 3+    Comorbidities Hyperlipidemia, OA, osteopenia, HTN, GERD    Examination-Activity Limitations Sleep;Sit;Squat;Bend;Carry;Stand;Lift    Examination-Participation Restrictions Community Activity;Cleaning;Shop;Laundry;Meal Prep    Stability/Clinical Decision Making Stable/Uncomplicated    PT Frequency 2x / week    PT Duration Other (comment)   10 weeks   PT Treatment/Interventions ADLs/Self Care Home Management;Electrical Stimulation;Cryotherapy;Iontophoresis 4mg /ml Dexamethasone;Moist Heat;Traction;Balance training;Therapeutic exercise;Therapeutic activities;Functional mobility training;Stair training;Gait training;DME Instruction;Ultrasound;Neuromuscular re-education;Patient/family education;Manual techniques;Passive range of motion;Dry needling;Taping;Joint Manipulations;Spinal Manipulations    PT Next Visit Plan promote improved lumbar mobility as well as hip/core stabilization to improve activity tolerance.  interested in aquatics- may be able to see at [facility] for 1-2 visits    PT [place] and Agree with Plan of Care Patient           Patient will benefit from skilled therapeutic intervention in order to improve the following deficits and impairments:  Hypomobility,Decreased activity tolerance,Decreased strength,Pain,Postural dysfunction,Decreased range of motion,Improper body mechanics,Impaired perceived functional ability,Impaired flexibility,Difficulty walking,Decreased mobility  Visit Diagnosis: Chronic midline low back pain without sciatica     Problem List Patient Active Problem List   Diagnosis Date Noted  . Chronic bilateral low  back pain without sciatica 04/09/2020  . Insomnia secondary to chronic pain 04/09/2020  . Stage 3a chronic kidney disease ([place]) 10/20/2019  . Vertigo 05/17/2018  . Intractable episodic headache 05/27/2017  . Primary osteoarthritis involving multiple joints 01/09/2016  . Gastroesophageal reflux disease with esophagitis 12/21/2013  . Vitamin D deficiency 10/30/2012  . Obesity (BMI 30.0-34.9) 10/28/2012  . Degenerative disc disease, lumbar  11/24/2011  . Type II diabetes mellitus with manifestations ([place]) 10/06/2011  . Routine general medical examination at a health care facility 10/06/2011  . Glaucoma 07/23/2008  . FIBROCYSTIC BREAST DISEASE 07/23/2008  . Hyperlipidemia with target LDL less than 100 04/19/2008  . Iron deficiency anemia 04/19/2008  . Essential hypertension 04/19/2008  . OA (osteoarthritis of spine) 04/19/2008  . OSTEOPENIA 04/19/2008      Laureen Abrahams, PT, DPT 05/13/20 8:43 AM     [facility] [address] [place], [place], [address] Phone: [phone]   Fax:  [phone]  Name: Alexandria [name] MRN: [id number] Date of Birth: [birth year]

## 2020-05-14 DIAGNOSIS — H401131 Primary open-angle glaucoma, bilateral, mild stage: Secondary | ICD-10-CM | POA: Diagnosis not present

## 2020-05-17 ENCOUNTER — Encounter: Payer: Self-pay | Admitting: Physical Therapy

## 2020-05-17 ENCOUNTER — Other Ambulatory Visit: Payer: Self-pay

## 2020-05-17 ENCOUNTER — Ambulatory Visit (INDEPENDENT_AMBULATORY_CARE_PROVIDER_SITE_OTHER): Payer: Medicare Other | Admitting: Physical Therapy

## 2020-05-17 DIAGNOSIS — M545 Low back pain, unspecified: Secondary | ICD-10-CM

## 2020-05-17 DIAGNOSIS — G8929 Other chronic pain: Secondary | ICD-10-CM

## 2020-05-17 NOTE — Therapy (Signed)
[place], [place], [address] Phone: [phone]   Fax:  [phone]  Physical Therapy Treatment  Patient Details  Name: Alexandria [name] MRN: [id number] Date of Birth: 01-04-1941 Referring Provider (PT): Dr. Lynne Leader   Encounter Date: 05/17/2020   PT End of Session - 05/17/20 0849    Visit Number 3    Number of Visits 16    Date for PT Re-Evaluation 07/17/20    Authorization Type Medicare/VA    Progress Note Due on Visit 10    PT Start Time 0803    PT Stop Time 0842    PT Time Calculation (min) 39 min    Activity Tolerance Patient tolerated treatment well    Behavior During Therapy [facility] for tasks assessed/performed           Past Medical History:  Diagnosis Date  . Anemia, iron deficiency   . Cataracts, bilateral   . Chest pain, unspecified   . Colon polyp 09/2006   adenomatous  . Fibrocystic breast disease   . GERD (gastroesophageal reflux disease)   . Glaucoma, right eye   . Hyperlipidemia   . Hypertension   . LBP (low back pain)   . Osteoarthritis   . Osteopenia   . Seasonal allergies     Past Surgical History:  Procedure Laterality Date  . ABDOMINAL HYSTERECTOMY    . BREAST LUMPECTOMY     RIGHT 22 years ago  . CHOLECYSTECTOMY      There were no vitals filed for this visit.   Subjective Assessment - 05/17/20 0804    Subjective back is hurting a little bit - didn't take any medication and pain is manageable    Limitations Lifting;Standing;Walking;House hold activities    Patient Stated Goals Reduce pain    Currently in Pain? Yes    Pain Score 3     Pain Location Back    Pain Orientation Lower    Pain Descriptors / Indicators Aching;Nagging;Spasm;Sharp    Pain Type Chronic pain    Pain Onset More than a month ago    Pain Frequency Intermittent    Aggravating Factors  worse at night, lifting things, bending, reaching overhead/lifting    Pain Relieving Factors medicine                              [facility] Adult PT Treatment/Exercise - 05/17/20 0807      Lumbar Exercises: Stretches   Single Knee to Chest Stretch Right;Left;3 reps;20 seconds    Lower Trunk Rotation 5 reps   15 sec   Piriformis Stretch Right;Left;3 reps;20 seconds      Lumbar Exercises: Aerobic   Recumbent Bike L3 x 8 min      Lumbar Exercises: Machines for Strengthening   Leg Press 100# 3x10      Lumbar Exercises: Standing   Heel Raises 20 reps    Other Standing Lumbar Exercises standing hip abduction/extension 2x10 reps bil; 3#                    PT Short Term Goals - 05/13/20 0841      PT SHORT TERM GOAL #1   Title Patient will demonstrate independent use of home exercise program to maintain progress from in clinic treatments.    Baseline 2/14: min cues    Time 3    Period Weeks    Status On-going    Target Date  05/29/20             PT Long Term Goals - 05/17/20 0849      PT LONG TERM GOAL #1   Title Patient will demonstrate/report pain at worst less than or equal to 2/10 to facilitate minimal limitation in daily activity secondary to pain symptoms.    Time 10    Period Weeks    Status On-going      PT LONG TERM GOAL #2   Title Patient will demonstrate independent use of home exercise program to facilitate ability to maintain/progress functional gains from skilled physical therapy services.    Time 10    Period Weeks    Status On-going      PT LONG TERM GOAL #3   Title Pt. will demonstrate FOTO update 56 or greater to indicated reduction in disabiity due to condition.    Time 10    Period Weeks    Status On-going      PT LONG TERM GOAL #4   Title Pt. will demonstrate ability to flexion lumbar to floor to facilitate bending for shoe/sock don/doff.    Time 10    Period Weeks    Status On-going      PT LONG TERM GOAL #5   Title Pt. will demonstrate lumbar extension 100 % WFL to facilitate standing/walking posture at PLOF.    Time 10     Period Weeks    Status On-going                 Plan - 05/17/20 0850    Clinical Impression Statement Pt reporting feeling improved flexibility and overall improved function with continued exercise and has decreased use of pain medication.  All goals ongoing at this time, and will continue to benefit from PT to maximize function.  Initiated strengthening today and will see how she responds next week.    Personal Factors and Comorbidities Comorbidity 3+    Comorbidities Hyperlipidemia, OA, osteopenia, HTN, GERD    Examination-Activity Limitations Sleep;Sit;Squat;Bend;Carry;Stand;Lift    Examination-Participation Restrictions Community Activity;Cleaning;Shop;Laundry;Meal Prep    Stability/Clinical Decision Making Stable/Uncomplicated    PT Frequency 2x / week    PT Duration Other (comment)   10 weeks   PT Treatment/Interventions ADLs/Self Care Home Management;Electrical Stimulation;Cryotherapy;Iontophoresis 4mg /ml Dexamethasone;Moist Heat;Traction;Balance training;Therapeutic exercise;Therapeutic activities;Functional mobility training;Stair training;Gait training;DME Instruction;Ultrasound;Neuromuscular re-education;Patient/family education;Manual techniques;Passive range of motion;Dry needling;Taping;Joint Manipulations;Spinal Manipulations    PT Next Visit Plan promote improved lumbar mobility as well as hip/core stabilization to improve activity tolerance.  interested in aquatics- may be able to see at [facility] for 1-2 visits.  see how strengthening went - add to [place] and Agree with Plan of Care Patient           Patient will benefit from skilled therapeutic intervention in order to improve the following deficits and impairments:  Hypomobility,Decreased activity tolerance,Decreased strength,Pain,Postural dysfunction,Decreased range of motion,Improper body mechanics,Impaired perceived functional ability,Impaired flexibility,Difficulty  walking,Decreased mobility  Visit Diagnosis: Chronic midline low back pain without sciatica     Problem List Patient Active Problem List   Diagnosis Date Noted  . Chronic bilateral low back pain without sciatica 04/09/2020  . Insomnia secondary to chronic pain 04/09/2020  . Stage 3a chronic kidney disease ([place]) 10/20/2019  . Vertigo 05/17/2018  . Intractable episodic headache 05/27/2017  . Primary osteoarthritis involving multiple joints 01/09/2016  . Gastroesophageal reflux disease with esophagitis 12/21/2013  . Vitamin D deficiency  10/30/2012  . Obesity (BMI 30.0-34.9) 10/28/2012  . Degenerative disc disease, lumbar 11/24/2011  . Type II diabetes mellitus with manifestations ([place]) 10/06/2011  . Routine general medical examination at a health care facility 10/06/2011  . Glaucoma 07/23/2008  . FIBROCYSTIC BREAST DISEASE 07/23/2008  . Hyperlipidemia with target LDL less than 100 04/19/2008  . Iron deficiency anemia 04/19/2008  . Essential hypertension 04/19/2008  . OA (osteoarthritis of spine) 04/19/2008  . OSTEOPENIA 04/19/2008      Laureen Abrahams, PT, DPT 05/17/20 8:52 AM    [facility] Physical Therapy [address] [place], [place], [address] Phone: [phone]   Fax:  [phone]  Name: BRAELEY BUSKEY MRN: [id number] Date of Birth: Feb 23, 1941

## 2020-05-22 ENCOUNTER — Encounter: Payer: Self-pay | Admitting: Rehabilitative and Restorative Service Providers"

## 2020-05-22 ENCOUNTER — Ambulatory Visit (INDEPENDENT_AMBULATORY_CARE_PROVIDER_SITE_OTHER): Payer: Medicare Other | Admitting: Rehabilitative and Restorative Service Providers"

## 2020-05-22 ENCOUNTER — Other Ambulatory Visit: Payer: Self-pay

## 2020-05-22 DIAGNOSIS — M545 Low back pain, unspecified: Secondary | ICD-10-CM | POA: Diagnosis not present

## 2020-05-22 DIAGNOSIS — G8929 Other chronic pain: Secondary | ICD-10-CM | POA: Diagnosis not present

## 2020-05-22 NOTE — Therapy (Signed)
[place], [place], [address] Phone: [phone]   Fax:  [phone]  Physical Therapy Treatment  Patient Details  Name: Alexandria [name] MRN: [id number] Date of Birth: 10-05-1940 Referring Provider (PT): Dr. Lynne Leader   Encounter Date: 05/22/2020   PT End of Session - 05/22/20 1001    Visit Number 4    Number of Visits 16    Date for PT Re-Evaluation 07/17/20    Authorization Type Medicare/VA    Progress Note Due on Visit 10    PT Start Time 0930    PT Stop Time 1009    PT Time Calculation (min) 39 min    Activity Tolerance Patient tolerated treatment well    Behavior During Therapy [facility] for tasks assessed/performed           Past Medical History:  Diagnosis Date  . Anemia, iron deficiency   . Cataracts, bilateral   . Chest pain, unspecified   . Colon polyp 09/2006   adenomatous  . Fibrocystic breast disease   . GERD (gastroesophageal reflux disease)   . Glaucoma, right eye   . Hyperlipidemia   . Hypertension   . LBP (low back pain)   . Osteoarthritis   . Osteopenia   . Seasonal allergies     Past Surgical History:  Procedure Laterality Date  . ABDOMINAL HYSTERECTOMY    . BREAST LUMPECTOMY     RIGHT 22 years ago  . CHOLECYSTECTOMY      There were no vitals filed for this visit.   Subjective Assessment - 05/22/20 0945    Subjective Pt. indicated 3/10 ache upon arrival today, indicated weather complaints.  Pt. stated she does feel like movements have been helpful in managing symptoms.    Limitations Lifting;Standing;Walking;House hold activities    Patient Stated Goals Reduce pain    Currently in Pain? Yes    Pain Score 3     Pain Location Back    Pain Orientation Lower    Pain Descriptors / Indicators Aching    Pain Type Chronic pain    Pain Onset More than a month ago    Pain Frequency Intermittent    Aggravating Factors  morning, end of day    Pain Relieving Factors exercise movements help               [facility] PT Assessment - 05/22/20 0001      AROM   Lumbar Extension 75% no complaints, REIS x 5                         [facility] Adult PT Treatment/Exercise - 05/22/20 0001      Lumbar Exercises: Stretches   Single Knee to Chest Stretch 20 seconds;2 reps;Left;Right    Lower Trunk Rotation 3 reps   3 x 15 sec holds   Standing Extension 10 reps   hands on hip     Lumbar Exercises: Aerobic   Nustep Lvl 5 10 mins      Lumbar Exercises: Machines for Strengthening   Leg Press 100# 3x10      Lumbar Exercises: Standing   Other Standing Lumbar Exercises reviewed standing hip abd/ext (implement for HEP next visit)      Lumbar Exercises: Supine   Bridge 20 reps;5 seconds                    PT Short Term Goals - 05/13/20 3382  PT SHORT TERM GOAL #1   Title Patient will demonstrate independent use of home exercise program to maintain progress from in clinic treatments.    Baseline 2/14: min cues    Time 3    Period Weeks    Status On-going    Target Date 05/29/20             PT Long Term Goals - 05/17/20 0849      PT LONG TERM GOAL #1   Title Patient will demonstrate/report pain at worst less than or equal to 2/10 to facilitate minimal limitation in daily activity secondary to pain symptoms.    Time 10    Period Weeks    Status On-going      PT LONG TERM GOAL #2   Title Patient will demonstrate independent use of home exercise program to facilitate ability to maintain/progress functional gains from skilled physical therapy services.    Time 10    Period Weeks    Status On-going      PT LONG TERM GOAL #3   Title Pt. will demonstrate FOTO update 56 or greater to indicated reduction in disabiity due to condition.    Time 10    Period Weeks    Status On-going      PT LONG TERM GOAL #4   Title Pt. will demonstrate ability to flexion lumbar to floor to facilitate bending for shoe/sock don/doff.    Time 10    Period Weeks     Status On-going      PT LONG TERM GOAL #5   Title Pt. will demonstrate lumbar extension 100 % WFL to facilitate standing/walking posture at PLOF.    Time 10    Period Weeks    Status On-going                 Plan - 05/22/20 0941    Clinical Impression Statement Improved lumbar mobility noted in clinic today per assessment.  Favorable reduction in arrival symptoms c movement. Discussed with Pt. about water aerobics plan for long term exercise and Pt. indicated plan to join [facility] for use.    Personal Factors and Comorbidities Comorbidity 3+    Comorbidities Hyperlipidemia, OA, osteopenia, HTN, GERD    Examination-Activity Limitations Sleep;Sit;Squat;Bend;Carry;Stand;Lift    Examination-Participation Restrictions Community Activity;Cleaning;Shop;Laundry;Meal Prep    Stability/Clinical Decision Making Stable/Uncomplicated    PT Frequency 2x / week    PT Duration Other (comment)   10 weeks   PT Treatment/Interventions ADLs/Self Care Home Management;Electrical Stimulation;Cryotherapy;Iontophoresis 4mg /ml Dexamethasone;Moist Heat;Traction;Balance training;Therapeutic exercise;Therapeutic activities;Functional mobility training;Stair training;Gait training;DME Instruction;Ultrasound;Neuromuscular re-education;Patient/family education;Manual techniques;Passive range of motion;Dry needling;Taping;Joint Manipulations;Spinal Manipulations    PT Next Visit Plan Continue plan for HEP use for pain management/mobility strategy.  Continue working towards wellness type exercise program, water aerobics at [place] and Agree with Plan of Care Patient           Patient will benefit from skilled therapeutic intervention in order to improve the following deficits and impairments:  Hypomobility,Decreased activity tolerance,Decreased strength,Pain,Postural dysfunction,Decreased range of motion,Improper body mechanics,Impaired perceived functional ability,Impaired  flexibility,Difficulty walking,Decreased mobility  Visit Diagnosis: Chronic midline low back pain without sciatica     Problem List Patient Active Problem List   Diagnosis Date Noted  . Chronic bilateral low back pain without sciatica 04/09/2020  . Insomnia secondary to chronic pain 04/09/2020  . Stage 3a chronic kidney disease ([place]) 10/20/2019  . Vertigo 05/17/2018  .  Intractable episodic headache 05/27/2017  . Primary osteoarthritis involving multiple joints 01/09/2016  . Gastroesophageal reflux disease with esophagitis 12/21/2013  . Vitamin D deficiency 10/30/2012  . Obesity (BMI 30.0-34.9) 10/28/2012  . Degenerative disc disease, lumbar 11/24/2011  . Type II diabetes mellitus with manifestations ([place]) 10/06/2011  . Routine general medical examination at a health care facility 10/06/2011  . Glaucoma 07/23/2008  . FIBROCYSTIC BREAST DISEASE 07/23/2008  . Hyperlipidemia with target LDL less than 100 04/19/2008  . Iron deficiency anemia 04/19/2008  . Essential hypertension 04/19/2008  . OA (osteoarthritis of spine) 04/19/2008  . OSTEOPENIA 04/19/2008    Scot Jun, PT, DPT, OCS, ATC 05/22/20  10:04 AM    [facility] Physical Therapy [address] [place], [place], [address] Phone: [phone]   Fax:  [phone]  Name: Alexandria [name] MRN: [id number] Date of Birth: 1941/03/02

## 2020-05-24 ENCOUNTER — Ambulatory Visit (INDEPENDENT_AMBULATORY_CARE_PROVIDER_SITE_OTHER): Payer: Medicare Other | Admitting: Rehabilitative and Restorative Service Providers"

## 2020-05-24 ENCOUNTER — Other Ambulatory Visit: Payer: Self-pay

## 2020-05-24 ENCOUNTER — Encounter: Payer: Self-pay | Admitting: Rehabilitative and Restorative Service Providers"

## 2020-05-24 DIAGNOSIS — G8929 Other chronic pain: Secondary | ICD-10-CM

## 2020-05-24 DIAGNOSIS — M545 Low back pain, unspecified: Secondary | ICD-10-CM | POA: Diagnosis not present

## 2020-05-24 NOTE — Therapy (Signed)
[place], [place], [address] Phone: [phone]   Fax:  [phone]  Physical Therapy Treatment  Patient Details  Name: Alexandria [name] MRN: [id number] Date of Birth: 04-29-1940 Referring Provider (PT): Dr. Lynne Leader   Encounter Date: 05/24/2020   PT End of Session - 05/24/20 0949    Visit Number 5    Number of Visits 16    Date for PT Re-Evaluation 07/17/20    Authorization Type Medicare/VA    Progress Note Due on Visit 10    PT Start Time 0946    PT Stop Time 1026    PT Time Calculation (min) 40 min    Activity Tolerance Patient tolerated treatment well    Behavior During Therapy [facility] for tasks assessed/performed           Past Medical History:  Diagnosis Date  . Anemia, iron deficiency   . Cataracts, bilateral   . Chest pain, unspecified   . Colon polyp 09/2006   adenomatous  . Fibrocystic breast disease   . GERD (gastroesophageal reflux disease)   . Glaucoma, right eye   . Hyperlipidemia   . Hypertension   . LBP (low back pain)   . Osteoarthritis   . Osteopenia   . Seasonal allergies     Past Surgical History:  Procedure Laterality Date  . ABDOMINAL HYSTERECTOMY    . BREAST LUMPECTOMY     RIGHT 22 years ago  . CHOLECYSTECTOMY      There were no vitals filed for this visit.   Subjective Assessment - 05/24/20 0951    Subjective Pt. stated 1/10 or so discomfort bother from back today.  Exercises this morning were helpful again.    Limitations Lifting;Standing;Walking;House hold activities    Patient Stated Goals Reduce pain    Currently in Pain? Yes    Pain Score 1     Pain Location Back    Pain Orientation Lower    Pain Descriptors / Indicators Sore;Aching    Pain Onset More than a month ago    Pain Frequency Intermittent    Aggravating Factors  morning stiffness              [facility] PT Assessment - 05/24/20 0001      Assessment   Medical Diagnosis DDD lumbar    Referring Provider  (PT) Dr. Lynne Leader    Onset Date/Surgical Date 03/30/20    Hand Dominance Right                         [facility] Adult PT Treatment/Exercise - 05/24/20 0001      Lumbar Exercises: Stretches   Lower Trunk Rotation 3 reps   15 sec x 3 bilateral     Lumbar Exercises: Aerobic   Nustep Lvl 6 12 mins      Lumbar Exercises: Machines for Strengthening   Leg Press 100 lbs 3 x 12 DL      Lumbar Exercises: Standing   Heel Raises 20 reps   heel and toe raises   Other Standing Lumbar Exercises standing hip abd, ext 20x each bilateral HHA on bar      Lumbar Exercises: Seated   Sit to Stand Other (comment)   2 x 10                   PT Short Term Goals - 05/13/20 0841      PT SHORT TERM GOAL #1  Title Patient will demonstrate independent use of home exercise program to maintain progress from in clinic treatments.    Baseline 2/14: min cues    Time 3    Period Weeks    Status On-going    Target Date 05/29/20             PT Long Term Goals - 05/17/20 0849      PT LONG TERM GOAL #1   Title Patient will demonstrate/report pain at worst less than or equal to 2/10 to facilitate minimal limitation in daily activity secondary to pain symptoms.    Time 10    Period Weeks    Status On-going      PT LONG TERM GOAL #2   Title Patient will demonstrate independent use of home exercise program to facilitate ability to maintain/progress functional gains from skilled physical therapy services.    Time 10    Period Weeks    Status On-going      PT LONG TERM GOAL #3   Title Pt. will demonstrate FOTO update 56 or greater to indicated reduction in disabiity due to condition.    Time 10    Period Weeks    Status On-going      PT LONG TERM GOAL #4   Title Pt. will demonstrate ability to flexion lumbar to floor to facilitate bending for shoe/sock don/doff.    Time 10    Period Weeks    Status On-going      PT LONG TERM GOAL #5   Title Pt. will demonstrate lumbar  extension 100 % WFL to facilitate standing/walking posture at PLOF.    Time 10    Period Weeks    Status On-going                 Plan - 05/24/20 1023    Clinical Impression Statement Transitioning today c global strength/conditioning for overall function improvements  Continued benefit from use of HEP for management plan.   Good tolerance to intervention overall.    Personal Factors and Comorbidities Comorbidity 3+    Comorbidities Hyperlipidemia, OA, osteopenia, HTN, GERD    Examination-Activity Limitations Sleep;Sit;Squat;Bend;Carry;Stand;Lift    Examination-Participation Restrictions Community Activity;Cleaning;Shop;Laundry;Meal Prep    Stability/Clinical Decision Making Stable/Uncomplicated    PT Frequency 2x / week    PT Duration Other (comment)   10 weeks   PT Treatment/Interventions ADLs/Self Care Home Management;Electrical Stimulation;Cryotherapy;Iontophoresis 4mg /ml Dexamethasone;Moist Heat;Traction;Balance training;Therapeutic exercise;Therapeutic activities;Functional mobility training;Stair training;Gait training;DME Instruction;Ultrasound;Neuromuscular re-education;Patient/family education;Manual techniques;Passive range of motion;Dry needling;Taping;Joint Manipulations;Spinal Manipulations    PT Next Visit Plan Continue plan for HEP use for pain management/mobility strategy.  Continue working towards wellness type exercise program, water aerobics at [place] and Agree with Plan of Care Patient           Patient will benefit from skilled therapeutic intervention in order to improve the following deficits and impairments:  Hypomobility,Decreased activity tolerance,Decreased strength,Pain,Postural dysfunction,Decreased range of motion,Improper body mechanics,Impaired perceived functional ability,Impaired flexibility,Difficulty walking,Decreased mobility  Visit Diagnosis: Chronic midline low back pain without  sciatica     Problem List Patient Active Problem List   Diagnosis Date Noted  . Chronic bilateral low back pain without sciatica 04/09/2020  . Insomnia secondary to chronic pain 04/09/2020  . Stage 3a chronic kidney disease ([place]) 10/20/2019  . Vertigo 05/17/2018  . Intractable episodic headache 05/27/2017  . Primary osteoarthritis involving multiple joints 01/09/2016  . Gastroesophageal reflux disease with  esophagitis 12/21/2013  . Vitamin D deficiency 10/30/2012  . Obesity (BMI 30.0-34.9) 10/28/2012  . Degenerative disc disease, lumbar 11/24/2011  . Type II diabetes mellitus with manifestations ([place]) 10/06/2011  . Routine general medical examination at a health care facility 10/06/2011  . Glaucoma 07/23/2008  . FIBROCYSTIC BREAST DISEASE 07/23/2008  . Hyperlipidemia with target LDL less than 100 04/19/2008  . Iron deficiency anemia 04/19/2008  . Essential hypertension 04/19/2008  . OA (osteoarthritis of spine) 04/19/2008  . OSTEOPENIA 04/19/2008   Scot Jun, PT, DPT, OCS, ATC 05/24/20  10:27 AM    [facility] Physical Therapy [address] [place], [place], [address] Phone: [phone]   Fax:  [phone]  Name: ISALY FASCHING MRN: [id number] Date of Birth: [birth year]

## 2020-05-28 ENCOUNTER — Other Ambulatory Visit: Payer: Self-pay

## 2020-05-28 ENCOUNTER — Ambulatory Visit (INDEPENDENT_AMBULATORY_CARE_PROVIDER_SITE_OTHER): Payer: Medicare Other | Admitting: Rehabilitative and Restorative Service Providers"

## 2020-05-28 ENCOUNTER — Encounter: Payer: Self-pay | Admitting: Rehabilitative and Restorative Service Providers"

## 2020-05-28 DIAGNOSIS — G8929 Other chronic pain: Secondary | ICD-10-CM

## 2020-05-28 DIAGNOSIS — M545 Low back pain, unspecified: Secondary | ICD-10-CM

## 2020-05-28 NOTE — Therapy (Signed)
[place], [place], [address] Phone: [phone]   Fax:  [phone]  Physical Therapy Treatment  Patient Details  Name: Alexandria [name] MRN: [id number] Date of Birth: 11/07/1940 Referring Provider (PT): Dr. Lynne Leader   Encounter Date: 05/28/2020   PT End of Session - 05/28/20 0935    Visit Number 6    Number of Visits 16    Date for PT Re-Evaluation 07/17/20    Authorization Type Medicare/VA    Progress Note Due on Visit 10    PT Start Time 0930    PT Stop Time 1009    PT Time Calculation (min) 39 min    Activity Tolerance Patient tolerated treatment well    Behavior During Therapy [facility] for tasks assessed/performed           Past Medical History:  Diagnosis Date  . Anemia, iron deficiency   . Cataracts, bilateral   . Chest pain, unspecified   . Colon polyp 09/2006   adenomatous  . Fibrocystic breast disease   . GERD (gastroesophageal reflux disease)   . Glaucoma, right eye   . Hyperlipidemia   . Hypertension   . LBP (low back pain)   . Osteoarthritis   . Osteopenia   . Seasonal allergies     Past Surgical History:  Procedure Laterality Date  . ABDOMINAL HYSTERECTOMY    . BREAST LUMPECTOMY     RIGHT 22 years ago  . CHOLECYSTECTOMY      There were no vitals filed for this visit.   Subjective Assessment - 05/28/20 0932    Subjective Pt. indicated 1-2/10 pain upon arrival today.  Pt. stated having a couple nights of pain up to 6/10.  Required pain medicine to try to help.  Pt. indicated she didn't try HEP in nighttime pain.    Limitations Lifting;Standing;Walking;House hold activities    Patient Stated Goals Reduce pain    Currently in Pain? Yes    Pain Score 1     Pain Location Back    Pain Orientation Lower    Pain Onset More than a month ago    Aggravating Factors  nighttime pain for 2 nights    Pain Relieving Factors medicine, exercise can help at times                              [facility] Adult PT Treatment/Exercise - 05/28/20 0001      Lumbar Exercises: Stretches   Lower Trunk Rotation 3 reps   15 sec x 3 bilateral   Standing Extension 2 reps;10 reps    Piriformis Stretch Left;Right;3 reps   15 seconds bilateral     Lumbar Exercises: Aerobic   Nustep Lvl 6 12 mins      Lumbar Exercises: Supine   Bridge 5 seconds;20 reps      Manual Therapy   Manual therapy comments cPA L3-L5 g2-g3 (very mild restriction L4, L5 c discomfort noted)                    PT Short Term Goals - 05/13/20 0841      PT SHORT TERM GOAL #1   Title Patient will demonstrate independent use of home exercise program to maintain progress from in clinic treatments.    Baseline 2/14: min cues    Time 3    Period Weeks    Status On-going    Target Date 05/29/20  PT Long Term Goals - 05/17/20 0849      PT LONG TERM GOAL #1   Title Patient will demonstrate/report pain at worst less than or equal to 2/10 to facilitate minimal limitation in daily activity secondary to pain symptoms.    Time 10    Period Weeks    Status On-going      PT LONG TERM GOAL #2   Title Patient will demonstrate independent use of home exercise program to facilitate ability to maintain/progress functional gains from skilled physical therapy services.    Time 10    Period Weeks    Status On-going      PT LONG TERM GOAL #3   Title Pt. will demonstrate FOTO update 56 or greater to indicated reduction in disabiity due to condition.    Time 10    Period Weeks    Status On-going      PT LONG TERM GOAL #4   Title Pt. will demonstrate ability to flexion lumbar to floor to facilitate bending for shoe/sock don/doff.    Time 10    Period Weeks    Status On-going      PT LONG TERM GOAL #5   Title Pt. will demonstrate lumbar extension 100 % WFL to facilitate standing/walking posture at PLOF.    Time 10    Period Weeks    Status On-going                  Plan - 05/28/20 0934    Clinical Impression Statement Pt. had instances of nighttime pain requiring medicine since last visit with insidious onset.  Pt. may benefit from use of HEP in nighttime pain occurences (education provided today c cues/recommendation).  Education given about chronicity of condition and importance of continued self management c movements and easing factors in times where symptoms may be more prevalent despite goals of overall reduction.    Personal Factors and Comorbidities Comorbidity 3+    Comorbidities Hyperlipidemia, OA, osteopenia, HTN, GERD    Examination-Activity Limitations Sleep;Sit;Squat;Bend;Carry;Stand;Lift    Examination-Participation Restrictions Community Activity;Cleaning;Shop;Laundry;Meal Prep    Stability/Clinical Decision Making Stable/Uncomplicated    PT Frequency 2x / week    PT Duration Other (comment)   10 weeks   PT Treatment/Interventions ADLs/Self Care Home Management;Electrical Stimulation;Cryotherapy;Iontophoresis 4mg /ml Dexamethasone;Moist Heat;Traction;Balance training;Therapeutic exercise;Therapeutic activities;Functional mobility training;Stair training;Gait training;DME Instruction;Ultrasound;Neuromuscular re-education;Patient/family education;Manual techniques;Passive range of motion;Dry needling;Taping;Joint Manipulations;Spinal Manipulations    PT Next Visit Plan Continue plan for HEP use for pain management/mobility strategy.  Continue working towards wellness type exercise program, water aerobics at [place] and Agree with Plan of Care Patient           Patient will benefit from skilled therapeutic intervention in order to improve the following deficits and impairments:  Hypomobility,Decreased activity tolerance,Decreased strength,Pain,Postural dysfunction,Decreased range of motion,Improper body mechanics,Impaired perceived functional ability,Impaired flexibility,Difficulty  walking,Decreased mobility  Visit Diagnosis: Chronic midline low back pain without sciatica     Problem List Patient Active Problem List   Diagnosis Date Noted  . Chronic bilateral low back pain without sciatica 04/09/2020  . Insomnia secondary to chronic pain 04/09/2020  . Stage 3a chronic kidney disease ([place]) 10/20/2019  . Vertigo 05/17/2018  . Intractable episodic headache 05/27/2017  . Primary osteoarthritis involving multiple joints 01/09/2016  . Gastroesophageal reflux disease with esophagitis 12/21/2013  . Vitamin D deficiency 10/30/2012  . Obesity (BMI 30.0-34.9) 10/28/2012  . Degenerative disc disease, lumbar 11/24/2011  .  Type II diabetes mellitus with manifestations ([place]) 10/06/2011  . Routine general medical examination at a health care facility 10/06/2011  . Glaucoma 07/23/2008  . FIBROCYSTIC BREAST DISEASE 07/23/2008  . Hyperlipidemia with target LDL less than 100 04/19/2008  . Iron deficiency anemia 04/19/2008  . Essential hypertension 04/19/2008  . OA (osteoarthritis of spine) 04/19/2008  . OSTEOPENIA 04/19/2008    Scot Jun, PT, DPT, OCS, ATC 05/28/20  10:01 AM    [facility] Physical Therapy [address] [place], [place], [address] Phone: [phone]   Fax:  [phone]  Name: Alexandria [name] MRN: [id number] Date of Birth: 1940/04/08

## 2020-05-30 ENCOUNTER — Encounter: Payer: Medicare Other | Admitting: Rehabilitative and Restorative Service Providers"

## 2020-06-03 ENCOUNTER — Encounter: Payer: Self-pay | Admitting: Rehabilitative and Restorative Service Providers"

## 2020-06-03 ENCOUNTER — Other Ambulatory Visit: Payer: Self-pay

## 2020-06-03 ENCOUNTER — Ambulatory Visit (INDEPENDENT_AMBULATORY_CARE_PROVIDER_SITE_OTHER): Payer: Medicare Other | Admitting: Rehabilitative and Restorative Service Providers"

## 2020-06-03 DIAGNOSIS — G8929 Other chronic pain: Secondary | ICD-10-CM | POA: Diagnosis not present

## 2020-06-03 DIAGNOSIS — M545 Low back pain, unspecified: Secondary | ICD-10-CM | POA: Diagnosis not present

## 2020-06-03 NOTE — Therapy (Signed)
[place], [place], [address] Phone: [phone]   Fax:  [phone]  Physical Therapy Treatment  Patient Details  Name: Alexandria [name] MRN: [id number] Date of Birth: [birth year] Referring Provider (PT): Dr. Lynne Leader   Encounter Date: 06/03/2020   PT End of Session - 06/03/20 0846    Visit Number 7    Number of Visits 16    Date for PT Re-Evaluation 07/17/20    Authorization Type Medicare/VA    Progress Note Due on Visit 10    PT Start Time 0842    PT Stop Time 0922    PT Time Calculation (min) 40 min    Activity Tolerance Patient tolerated treatment well    Behavior During Therapy [facility] for tasks assessed/performed           Past Medical History:  Diagnosis Date  . Anemia, iron deficiency   . Cataracts, bilateral   . Chest pain, unspecified   . Colon polyp 09/2006   adenomatous  . Fibrocystic breast disease   . GERD (gastroesophageal reflux disease)   . Glaucoma, right eye   . Hyperlipidemia   . Hypertension   . LBP (low back pain)   . Osteoarthritis   . Osteopenia   . Seasonal allergies     Past Surgical History:  Procedure Laterality Date  . ABDOMINAL HYSTERECTOMY    . BREAST LUMPECTOMY     RIGHT 22 years ago  . CHOLECYSTECTOMY      There were no vitals filed for this visit.   Subjective Assessment - 06/03/20 0844    Subjective Pt. indicated feeling 2/10 or so upon arrival today.  Pt. stated similar overall presentation, c some increase in symptoms waking her at night up to 4/10 or so.  Able to do HEP and get back to sleep.  Daytime symptoms are lower than nighttime.    Limitations Lifting;Standing;Walking;House hold activities    Patient Stated Goals Reduce pain    Currently in Pain? Yes    Pain Score 2     Pain Location Back    Pain Orientation Lower    Pain Descriptors / Indicators Sore;Aching    Pain Onset More than a month ago    Aggravating Factors  nighttime insidious symptoms    Pain  Relieving Factors HEP to get back to sleep    Effect of Pain on Daily Activities sleeping                             [facility] Adult PT Treatment/Exercise - 06/03/20 0001      Lumbar Exercises: Stretches   Standing Extension 2 reps;10 reps      Lumbar Exercises: Aerobic   Nustep Lvl 6 12 mins UE/LE      Lumbar Exercises: Machines for Strengthening   Leg Press 100 lbs 3 x 12 DL      Lumbar Exercises: Standing   Heel Raises 20 reps    Other Standing Lumbar Exercises green band rows, gh ext 2 x 15 each    Other Standing Lumbar Exercises x 15 hip abd, ext each bilateral                  PT Education - 06/03/20 0845    Education Details Continued HEP for timing related to nighttime (pre bed time, in pain instances, upon waking)    Person(s) Educated Patient    Methods Demonstration;Explanation  Comprehension Verbalized understanding;Returned demonstration            PT Short Term Goals - 05/13/20 0841      PT SHORT TERM GOAL #1   Title Patient will demonstrate independent use of home exercise program to maintain progress from in clinic treatments.    Baseline 2/14: min cues    Time 3    Period Weeks    Status On-going    Target Date 05/29/20             PT Long Term Goals - 05/17/20 0849      PT LONG TERM GOAL #1   Title Patient will demonstrate/report pain at worst less than or equal to 2/10 to facilitate minimal limitation in daily activity secondary to pain symptoms.    Time 10    Period Weeks    Status On-going      PT LONG TERM GOAL #2   Title Patient will demonstrate independent use of home exercise program to facilitate ability to maintain/progress functional gains from skilled physical therapy services.    Time 10    Period Weeks    Status On-going      PT LONG TERM GOAL #3   Title Pt. will demonstrate FOTO update 56 or greater to indicated reduction in disabiity due to condition.    Time 10    Period Weeks    Status  On-going      PT LONG TERM GOAL #4   Title Pt. will demonstrate ability to flexion lumbar to floor to facilitate bending for shoe/sock don/doff.    Time 10    Period Weeks    Status On-going      PT LONG TERM GOAL #5   Title Pt. will demonstrate lumbar extension 100 % WFL to facilitate standing/walking posture at PLOF.    Time 10    Period Weeks    Status On-going                 Plan - 06/03/20 0915    Clinical Impression Statement Similar complaints of night time pain indicated but Pt. reported benefit from use of HEP in instances to allow her to resume sleep.  Continued focus at this time on improvement in lumbar mobility, overall core/hip strength and self care management c HEP.    Personal Factors and Comorbidities Comorbidity 3+    Comorbidities Hyperlipidemia, OA, osteopenia, HTN, GERD    Examination-Activity Limitations Sleep;Sit;Squat;Bend;Carry;Stand;Lift    Examination-Participation Restrictions Community Activity;Cleaning;Shop;Laundry;Meal Prep    Stability/Clinical Decision Making Stable/Uncomplicated    PT Frequency 2x / week    PT Duration Other (comment)   10 weeks   PT Treatment/Interventions ADLs/Self Care Home Management;Electrical Stimulation;Cryotherapy;Iontophoresis 4mg /ml Dexamethasone;Moist Heat;Traction;Balance training;Therapeutic exercise;Therapeutic activities;Functional mobility training;Stair training;Gait training;DME Instruction;Ultrasound;Neuromuscular re-education;Patient/family education;Manual techniques;Passive range of motion;Dry needling;Taping;Joint Manipulations;Spinal Manipulations    PT Next Visit Plan Continue working towards wellness type exercise program c strengthening core/LE, water aerobics at [place] and Agree with Plan of Care Patient           Patient will benefit from skilled therapeutic intervention in order to improve the following deficits and impairments:   Hypomobility,Decreased activity tolerance,Decreased strength,Pain,Postural dysfunction,Decreased range of motion,Improper body mechanics,Impaired perceived functional ability,Impaired flexibility,Difficulty walking,Decreased mobility  Visit Diagnosis: Chronic midline low back pain without sciatica     Problem List Patient Active Problem List   Diagnosis Date Noted  . Chronic bilateral low back pain  without sciatica 04/09/2020  . Insomnia secondary to chronic pain 04/09/2020  . Stage 3a chronic kidney disease ([place]) 10/20/2019  . Vertigo 05/17/2018  . Intractable episodic headache 05/27/2017  . Primary osteoarthritis involving multiple joints 01/09/2016  . Gastroesophageal reflux disease with esophagitis 12/21/2013  . Vitamin D deficiency 10/30/2012  . Obesity (BMI 30.0-34.9) 10/28/2012  . Degenerative disc disease, lumbar 11/24/2011  . Type II diabetes mellitus with manifestations ([place]) 10/06/2011  . Routine general medical examination at a health care facility 10/06/2011  . Glaucoma 07/23/2008  . FIBROCYSTIC BREAST DISEASE 07/23/2008  . Hyperlipidemia with target LDL less than 100 04/19/2008  . Iron deficiency anemia 04/19/2008  . Essential hypertension 04/19/2008  . OA (osteoarthritis of spine) 04/19/2008  . OSTEOPENIA 04/19/2008    Scot Jun, PT, DPT, OCS, ATC 06/03/20  9:18 AM    [facility] Physical Therapy [address] [place], [place], [address] Phone: [phone]   Fax:  [phone]  Name: Alexandria [name] MRN: [id number] Date of Birth: [birth year]

## 2020-06-05 ENCOUNTER — Ambulatory Visit (INDEPENDENT_AMBULATORY_CARE_PROVIDER_SITE_OTHER): Payer: Medicare Other | Admitting: Rehabilitative and Restorative Service Providers"

## 2020-06-05 ENCOUNTER — Other Ambulatory Visit: Payer: Self-pay

## 2020-06-05 ENCOUNTER — Encounter: Payer: Self-pay | Admitting: Rehabilitative and Restorative Service Providers"

## 2020-06-05 DIAGNOSIS — M545 Low back pain, unspecified: Secondary | ICD-10-CM | POA: Diagnosis not present

## 2020-06-05 DIAGNOSIS — G8929 Other chronic pain: Secondary | ICD-10-CM

## 2020-06-05 NOTE — Therapy (Signed)
[place], [place], [address] Phone: [phone]   Fax:  [phone]  Physical Therapy Treatment  Patient Details  Name: Alexandria [name] MRN: [id number] Date of Birth: [birth year] Referring Provider (PT): Dr. Lynne Leader   Encounter Date: 06/05/2020   PT End of Session - 06/05/20 0841    Visit Number 8    Number of Visits 16    Date for PT Re-Evaluation 07/17/20    Authorization Type Medicare/VA    Progress Note Due on Visit 10    PT Start Time 0843    PT Stop Time 0922    PT Time Calculation (min) 39 min    Activity Tolerance Patient tolerated treatment well    Behavior During Therapy [facility] for tasks assessed/performed           Past Medical History:  Diagnosis Date  . Anemia, iron deficiency   . Cataracts, bilateral   . Chest pain, unspecified   . Colon polyp 09/2006   adenomatous  . Fibrocystic breast disease   . GERD (gastroesophageal reflux disease)   . Glaucoma, right eye   . Hyperlipidemia   . Hypertension   . LBP (low back pain)   . Osteoarthritis   . Osteopenia   . Seasonal allergies     Past Surgical History:  Procedure Laterality Date  . ABDOMINAL HYSTERECTOMY    . BREAST LUMPECTOMY     RIGHT 22 years ago  . CHOLECYSTECTOMY      There were no vitals filed for this visit.   Subjective Assessment - 06/05/20 0846    Subjective Pt. indicated 2/10 today, sore and achy noting the weather. Pt. stated last night was 4/10 or so.  Pt. indicated she got signed up at the [facility] for some classes.    Limitations Lifting;Standing;Walking;House hold activities    Patient Stated Goals Reduce pain    Pain Score 2     Pain Location Back    Pain Orientation Lower    Pain Descriptors / Indicators Sore;Aching    Pain Onset More than a month ago    Aggravating Factors  nightime complaints, weather related complaints                             [facility] Adult PT Treatment/Exercise - 06/05/20 0001       Lumbar Exercises: Stretches   Standing Extension 2 reps;10 reps      Lumbar Exercises: Aerobic   Nustep Lvl 6 15 mins      Lumbar Exercises: Machines for Strengthening   Leg Press 100 lbs 3 x 12 DL      Lumbar Exercises: Standing   Other Standing Lumbar Exercises green band rows, gh ext 2 x 15 each                    PT Short Term Goals - 06/03/20 9449      PT SHORT TERM GOAL #1   Title Patient will demonstrate independent use of home exercise program to maintain progress from in clinic treatments.    Baseline 2/14: min cues    Time 3    Period Weeks    Status Achieved    Target Date 05/29/20             PT Long Term Goals - 06/05/20 0856      PT LONG TERM GOAL #1   Title Patient will demonstrate/report pain at  worst less than or equal to 2/10 to facilitate minimal limitation in daily activity secondary to pain symptoms.    Baseline 06/05/2020: 4/10 at nighttime    Time 10    Period Weeks    Status On-going    Target Date 07/17/20      PT LONG TERM GOAL #2   Title Patient will demonstrate independent use of home exercise program to facilitate ability to maintain/progress functional gains from skilled physical therapy services.    Time 10    Period Weeks    Status On-going    Target Date 07/17/20      PT LONG TERM GOAL #3   Title Pt. will demonstrate FOTO update 56 or greater to indicated reduction in disabiity due to condition.    Time 10    Period Weeks    Status On-going    Target Date 07/17/20      PT LONG TERM GOAL #4   Title Pt. will demonstrate ability to flexion lumbar to floor to facilitate bending for shoe/sock don/doff.    Time 10    Period Weeks    Status On-going      PT LONG TERM GOAL #5   Title Pt. will demonstrate lumbar extension 100 % WFL to facilitate standing/walking posture at PLOF.    Time 10    Period Weeks    Status On-going    Target Date 07/17/20                 Plan - 06/05/20 0854    Clinical  Impression Statement Pt. consistently leaves appointments with reduced complaints compared to arrival.  At this point, improvement is mostly due to graded mobility intervention and overall fitness program that has been pointed out to be a possible benefit for long term gains.    Personal Factors and Comorbidities Comorbidity 3+    Comorbidities Hyperlipidemia, OA, osteopenia, HTN, GERD    Examination-Activity Limitations Sleep;Sit;Squat;Bend;Carry;Stand;Lift    Examination-Participation Restrictions Community Activity;Cleaning;Shop;Laundry;Meal Prep    Stability/Clinical Decision Making Stable/Uncomplicated    PT Frequency 2x / week    PT Duration Other (comment)   10 weeks   PT Treatment/Interventions ADLs/Self Care Home Management;Electrical Stimulation;Cryotherapy;Iontophoresis 4mg /ml Dexamethasone;Moist Heat;Traction;Balance training;Therapeutic exercise;Therapeutic activities;Functional mobility training;Stair training;Gait training;DME Instruction;Ultrasound;Neuromuscular re-education;Patient/family education;Manual techniques;Passive range of motion;Dry needling;Taping;Joint Manipulations;Spinal Manipulations    PT Next Visit Plan Continue working towards wellness type exercise program c strengthening core/LE. 2 more visits scheduled at this time.    PT Home Exercise Plan [id number]    Consulted and Agree with Plan of Care Patient           Patient will benefit from skilled therapeutic intervention in order to improve the following deficits and impairments:  Hypomobility,Decreased activity tolerance,Decreased strength,Pain,Postural dysfunction,Decreased range of motion,Improper body mechanics,Impaired perceived functional ability,Impaired flexibility,Difficulty walking,Decreased mobility  Visit Diagnosis: Chronic midline low back pain without sciatica     Problem List Patient Active Problem List   Diagnosis Date Noted  . Chronic bilateral low back pain without sciatica 04/09/2020  .  Insomnia secondary to chronic pain 04/09/2020  . Stage 3a chronic kidney disease ([place]) 10/20/2019  . Vertigo 05/17/2018  . Intractable episodic headache 05/27/2017  . Primary osteoarthritis involving multiple joints 01/09/2016  . Gastroesophageal reflux disease with esophagitis 12/21/2013  . Vitamin D deficiency 10/30/2012  . Obesity (BMI 30.0-34.9) 10/28/2012  . Degenerative disc disease, lumbar 11/24/2011  . Type II diabetes mellitus with manifestations ([place]) 10/06/2011  . Routine general medical examination at a health  care facility 10/06/2011  . Glaucoma 07/23/2008  . FIBROCYSTIC BREAST DISEASE 07/23/2008  . Hyperlipidemia with target LDL less than 100 04/19/2008  . Iron deficiency anemia 04/19/2008  . Essential hypertension 04/19/2008  . OA (osteoarthritis of spine) 04/19/2008  . OSTEOPENIA 04/19/2008   Scot Jun, PT, DPT, OCS, ATC 06/05/20  9:20 AM    [facility] Physical Therapy [address] [place], [place], [address] Phone: [phone]   Fax:  [phone]  Name: Alexandria [name] MRN: [id number] Date of Birth: [birth year]

## 2020-06-10 ENCOUNTER — Ambulatory Visit (INDEPENDENT_AMBULATORY_CARE_PROVIDER_SITE_OTHER): Payer: Medicare Other | Admitting: Rehabilitative and Restorative Service Providers"

## 2020-06-10 ENCOUNTER — Other Ambulatory Visit: Payer: Self-pay

## 2020-06-10 ENCOUNTER — Encounter: Payer: Self-pay | Admitting: Rehabilitative and Restorative Service Providers"

## 2020-06-10 DIAGNOSIS — G8929 Other chronic pain: Secondary | ICD-10-CM

## 2020-06-10 DIAGNOSIS — M545 Low back pain, unspecified: Secondary | ICD-10-CM | POA: Diagnosis not present

## 2020-06-10 NOTE — Therapy (Signed)
[place], [place], [address] Phone: [phone]   Fax:  [phone]  Physical Therapy Treatment  Patient Details  Name: Alexandria [name] MRN: [id number] Date of Birth: [birth year] Referring Provider (PT): Dr. Lynne Leader   Encounter Date: 06/10/2020   PT End of Session - 06/10/20 0846    Visit Number 9    Number of Visits 16    Date for PT Re-Evaluation 07/17/20    Authorization Type Medicare/VA    Progress Note Due on Visit 10    PT Start Time 0842    PT Stop Time 0921    PT Time Calculation (min) 39 min    Activity Tolerance Patient tolerated treatment well    Behavior During Therapy [facility] for tasks assessed/performed           Past Medical History:  Diagnosis Date  . Anemia, iron deficiency   . Cataracts, bilateral   . Chest pain, unspecified   . Colon polyp 09/2006   adenomatous  . Fibrocystic breast disease   . GERD (gastroesophageal reflux disease)   . Glaucoma, right eye   . Hyperlipidemia   . Hypertension   . LBP (low back pain)   . Osteoarthritis   . Osteopenia   . Seasonal allergies     Past Surgical History:  Procedure Laterality Date  . ABDOMINAL HYSTERECTOMY    . BREAST LUMPECTOMY     RIGHT 22 years ago  . CHOLECYSTECTOMY      There were no vitals filed for this visit.   Subjective Assessment - 06/10/20 0844    Subjective Pt. indicated 2/10 tightness today.  Pt. stated one night over the weekend was the usually painful, related to bad weather night.    Limitations Lifting;Standing;Walking;House hold activities    Patient Stated Goals Reduce pain    Currently in Pain? Yes    Pain Score 2     Pain Location Back    Pain Orientation Lower    Pain Descriptors / Indicators Sore    Pain Onset More than a month ago    Aggravating Factors  nightime, bad weather night    Pain Relieving Factors HEP              [facility] PT Assessment - 06/10/20 0001      Assessment   Medical Diagnosis DDD  lumbar    Referring Provider (PT) Dr. Lynne Leader    Onset Date/Surgical Date 03/30/20    Hand Dominance Right      AROM   Lumbar Extension 75% upon arrival, 100 % after 10 reps extension standing                         [facility] Adult PT Treatment/Exercise - 06/10/20 0001      Lumbar Exercises: Stretches   Standing Extension 2 reps;10 reps      Lumbar Exercises: Aerobic   Nustep Lvl 6 15 mins      Lumbar Exercises: Machines for Strengthening   Leg Press 100 lbs 3 x 15 DL      Lumbar Exercises: Standing   Other Standing Lumbar Exercises green band rows, gh ext 2 x 15 each    Other Standing Lumbar Exercises x 15 hip abd, ext each bilateral      Lumbar Exercises: Seated   Sit to Stand 20 reps   18 inch chair height, slow eccentric focus on lowering  PT Short Term Goals - 06/03/20 2536      PT SHORT TERM GOAL #1   Title Patient will demonstrate independent use of home exercise program to maintain progress from in clinic treatments.    Baseline 2/14: min cues    Time 3    Period Weeks    Status Achieved    Target Date 05/29/20             PT Long Term Goals - 06/05/20 0856      PT LONG TERM GOAL #1   Title Patient will demonstrate/report pain at worst less than or equal to 2/10 to facilitate minimal limitation in daily activity secondary to pain symptoms.    Baseline 06/05/2020: 4/10 at nighttime    Time 10    Period Weeks    Status On-going    Target Date 07/17/20      PT LONG TERM GOAL #2   Title Patient will demonstrate independent use of home exercise program to facilitate ability to maintain/progress functional gains from skilled physical therapy services.    Time 10    Period Weeks    Status On-going    Target Date 07/17/20      PT LONG TERM GOAL #3   Title Pt. will demonstrate FOTO update 56 or greater to indicated reduction in disabiity due to condition.    Time 10    Period Weeks    Status On-going    Target  Date 07/17/20      PT LONG TERM GOAL #4   Title Pt. will demonstrate ability to flexion lumbar to floor to facilitate bending for shoe/sock don/doff.    Time 10    Period Weeks    Status On-going      PT LONG TERM GOAL #5   Title Pt. will demonstrate lumbar extension 100 % WFL to facilitate standing/walking posture at PLOF.    Time 10    Period Weeks    Status On-going    Target Date 07/17/20                 Plan - 06/10/20 0906    Clinical Impression Statement Pt. has established plan for attendance to workout classes and wellness program.  Mild complaints of lower lumbar today noted c extension mobility but overall showing better mobility in area.    Personal Factors and Comorbidities Comorbidity 3+    Comorbidities Hyperlipidemia, OA, osteopenia, HTN, GERD    Examination-Activity Limitations Sleep;Sit;Squat;Bend;Carry;Stand;Lift    Examination-Participation Restrictions Community Activity;Cleaning;Shop;Laundry;Meal Prep    Stability/Clinical Decision Making Stable/Uncomplicated    PT Frequency 2x / week    PT Duration Other (comment)   10 weeks   PT Treatment/Interventions ADLs/Self Care Home Management;Electrical Stimulation;Cryotherapy;Iontophoresis 4mg /ml Dexamethasone;Moist Heat;Traction;Balance training;Therapeutic exercise;Therapeutic activities;Functional mobility training;Stair training;Gait training;DME Instruction;Ultrasound;Neuromuscular re-education;Patient/family education;Manual techniques;Passive range of motion;Dry needling;Taping;Joint Manipulations;Spinal Manipulations    PT Next Visit Plan 10th visits reassessment, possible D/C to HEP?    PT Home Exercise Plan [id number]    Consulted and Agree with Plan of Care Patient           Patient will benefit from skilled therapeutic intervention in order to improve the following deficits and impairments:  Hypomobility,Decreased activity tolerance,Decreased strength,Pain,Postural dysfunction,Decreased range of  motion,Improper body mechanics,Impaired perceived functional ability,Impaired flexibility,Difficulty walking,Decreased mobility  Visit Diagnosis: Chronic midline low back pain without sciatica     Problem List Patient Active Problem List   Diagnosis Date Noted  . Chronic bilateral low back pain without sciatica 04/09/2020  .  Insomnia secondary to chronic pain 04/09/2020  . Stage 3a chronic kidney disease ([place]) 10/20/2019  . Vertigo 05/17/2018  . Intractable episodic headache 05/27/2017  . Primary osteoarthritis involving multiple joints 01/09/2016  . Gastroesophageal reflux disease with esophagitis 12/21/2013  . Vitamin D deficiency 10/30/2012  . Obesity (BMI 30.0-34.9) 10/28/2012  . Degenerative disc disease, lumbar 11/24/2011  . Type II diabetes mellitus with manifestations ([place]) 10/06/2011  . Routine general medical examination at a health care facility 10/06/2011  . Glaucoma 07/23/2008  . FIBROCYSTIC BREAST DISEASE 07/23/2008  . Hyperlipidemia with target LDL less than 100 04/19/2008  . Iron deficiency anemia 04/19/2008  . Essential hypertension 04/19/2008  . OA (osteoarthritis of spine) 04/19/2008  . OSTEOPENIA 04/19/2008   Scot Jun, PT, DPT, OCS, ATC 06/10/20  9:15 AM    [facility] Physical Therapy [address] [place], [place], [address] Phone: [phone]   Fax:  [phone]  Name: Alexandria [name] MRN: [id number] Date of Birth: Apr 12, 1940

## 2020-06-12 ENCOUNTER — Encounter: Payer: Self-pay | Admitting: Rehabilitative and Restorative Service Providers"

## 2020-06-12 ENCOUNTER — Ambulatory Visit (INDEPENDENT_AMBULATORY_CARE_PROVIDER_SITE_OTHER): Payer: Medicare Other | Admitting: Rehabilitative and Restorative Service Providers"

## 2020-06-12 ENCOUNTER — Other Ambulatory Visit: Payer: Self-pay

## 2020-06-12 DIAGNOSIS — G8929 Other chronic pain: Secondary | ICD-10-CM

## 2020-06-12 DIAGNOSIS — M545 Low back pain, unspecified: Secondary | ICD-10-CM | POA: Diagnosis not present

## 2020-06-12 NOTE — Therapy (Signed)
[facility] Physical Therapy [address] [place], [place], [address] Phone: [phone]   Fax:  [phone]  Physical Therapy Treatment/Discharge  Patient Details  Name: Alexandria [name] MRN: [id number] Date of Birth: 1940-08-19 Referring Provider (PT): Dr. Lynne Leader   Encounter Date: 06/12/2020   PHYSICAL THERAPY DISCHARGE SUMMARY  Visits from Start of Care: 10  Current functional level related to goals / functional outcomes: See note   Remaining deficits: See note   Education / Equipment: HEP Plan: Patient agrees to discharge.  Patient goals were met. Patient is being discharged due to being pleased with the current functional level.  ?????        PT End of Session - 06/12/20 0838    Visit Number 10    Number of Visits 16    Date for PT Re-Evaluation 07/17/20    Authorization Type Medicare/VA    Progress Note Due on Visit 20    PT Start Time 0841    PT Stop Time 0919    PT Time Calculation (min) 38 min    Activity Tolerance Patient tolerated treatment well    Behavior During Therapy [facility] for tasks assessed/performed           Past Medical History:  Diagnosis Date  . Anemia, iron deficiency   . Cataracts, bilateral   . Chest pain, unspecified   . Colon polyp 09/2006   adenomatous  . Fibrocystic breast disease   . GERD (gastroesophageal reflux disease)   . Glaucoma, right eye   . Hyperlipidemia   . Hypertension   . LBP (low back pain)   . Osteoarthritis   . Osteopenia   . Seasonal allergies     Past Surgical History:  Procedure Laterality Date  . ABDOMINAL HYSTERECTOMY    . BREAST LUMPECTOMY     RIGHT 22 years ago  . CHOLECYSTECTOMY      There were no vitals filed for this visit.   Subjective Assessment - 06/12/20 0842    Subjective Pt. stated similar overall presentation and feeling ready for trying HEP and wellness program.    Limitations Lifting;Standing;Walking;House hold activities    Patient Stated Goals Reduce pain     Currently in Pain? Yes    Pain Score 2     Pain Location Back    Pain Orientation Lower    Pain Descriptors / Indicators Sore    Pain Type Chronic pain    Pain Onset More than a month ago    Aggravating Factors  nighttime              [facility] PT Assessment - 06/12/20 0001      Assessment   Medical Diagnosis DDD lumbar    Referring Provider (PT) Dr. Lynne Leader    Onset Date/Surgical Date 03/30/20    Hand Dominance Right      Observation/Other Assessments   Focus on Therapeutic Outcomes (FOTO)  67      AROM   Lumbar Extension 75% upon arrival, 100 % after 10 reps extension standing                         [facility] Adult PT Treatment/Exercise - 06/12/20 0001      Exercises   Other Exercises  HEP review      Lumbar Exercises: Stretches   Standing Extension 2 reps;10 reps      Lumbar Exercises: Aerobic   Nustep lvl 6 15 mins  Lumbar Exercises: Machines for Strengthening   Leg Press 100 lbs 3 x 15 DL      Lumbar Exercises: Standing   Heel Raises 20 reps   heel raise/toe raise   Other Standing Lumbar Exercises green band rows, gh ext 2 x 15 each    Other Standing Lumbar Exercises x 15 hip abd, ext each bilateral      Lumbar Exercises: Seated   Sit to Stand 10 reps                  PT Education - 06/12/20 0843    Education Details D/C review, HEP    Person(s) Educated Patient    Methods Explanation;Demonstration;Verbal cues    Comprehension Returned demonstration;Verbalized understanding            PT Short Term Goals - 06/03/20 0918      PT SHORT TERM GOAL #1   Title Patient will demonstrate independent use of home exercise program to maintain progress from in clinic treatments.    Baseline 2/14: min cues    Time 3    Period Weeks    Status Achieved    Target Date 05/29/20             PT Long Term Goals - 06/12/20 0914      PT LONG TERM GOAL #1   Title Patient will demonstrate/report pain at worst less than or equal  to 2/10 to facilitate minimal limitation in daily activity secondary to pain symptoms.    Baseline 06/05/2020: 4/10 at nighttime    Time 10    Period Weeks    Status Partially Met      PT LONG TERM GOAL #2   Title Patient will demonstrate independent use of home exercise program to facilitate ability to maintain/progress functional gains from skilled physical therapy services.    Time 10    Period Weeks    Status Achieved      PT LONG TERM GOAL #3   Title Pt. will demonstrate FOTO update 56 or greater to indicated reduction in disabiity due to condition.    Time 10    Period Weeks    Status On-going      PT LONG TERM GOAL #4   Title Pt. will demonstrate ability to flexion lumbar to floor to facilitate bending for shoe/sock don/doff.    Time 10    Period Weeks    Status Achieved      PT LONG TERM GOAL #5   Title Pt. will demonstrate lumbar extension 100 % WFL to facilitate standing/walking posture at PLOF.    Time 10    Period Weeks    Status Achieved                 Plan - 06/12/20 0843    Clinical Impression Statement Pt. has attended 10 visits overall during course of treatment, reporting mild grade pain symptoms at worst in last few weeks.  See objective data for updated information.  Pt. has shown and reported improvement in symptoms overall and shown improved management of symptoms c use of HEP to this point.  Plan to D/C to HEP at this time c use of wellness programs at gym to continue progression.  Return to PT, MD per symptoms in future. .    Personal Factors and Comorbidities Comorbidity 3+    Comorbidities Hyperlipidemia, OA, osteopenia, HTN, GERD    Examination-Activity Limitations Sleep;Sit;Squat;Bend;Carry;Stand;Lift    Examination-Participation Restrictions Community Activity;Cleaning;Shop;Laundry;Meal Prep  Stability/Clinical Decision Making Stable/Uncomplicated    PT Treatment/Interventions ADLs/Self Care Home Management;Electrical  Stimulation;Cryotherapy;Iontophoresis 36m/ml Dexamethasone;Moist Heat;Traction;Balance training;Therapeutic exercise;Therapeutic activities;Functional mobility training;Stair training;Gait training;DME Instruction;Ultrasound;Neuromuscular re-education;Patient/family education;Manual techniques;Passive range of motion;Dry needling;Taping;Joint Manipulations;Spinal Manipulations    PT Next Visit Plan D/C to HEP, return prn.    PT Home Exercise Plan [id number]    Consulted and Agree with Plan of Care Patient           Patient will benefit from skilled therapeutic intervention in order to improve the following deficits and impairments:  Hypomobility,Decreased activity tolerance,Decreased strength,Pain,Postural dysfunction,Decreased range of motion,Improper body mechanics,Impaired perceived functional ability,Impaired flexibility,Difficulty walking,Decreased mobility  Visit Diagnosis: Chronic midline low back pain without sciatica     Problem List Patient Active Problem List   Diagnosis Date Noted  . Chronic bilateral low back pain without sciatica 04/09/2020  . Insomnia secondary to chronic pain 04/09/2020  . Stage 3a chronic kidney disease ([place] 10/20/2019  . Vertigo 05/17/2018  . Intractable episodic headache 05/27/2017  . Primary osteoarthritis involving multiple joints 01/09/2016  . Gastroesophageal reflux disease with esophagitis 12/21/2013  . Vitamin D deficiency 10/30/2012  . Obesity (BMI 30.0-34.9) 10/28/2012  . Degenerative disc disease, lumbar 11/24/2011  . Type II diabetes mellitus with manifestations ([place] 10/06/2011  . Routine general medical examination at a health care facility 10/06/2011  . Glaucoma 07/23/2008  . FIBROCYSTIC BREAST DISEASE 07/23/2008  . Hyperlipidemia with target LDL less than 100 04/19/2008  . Iron deficiency anemia 04/19/2008  . Essential hypertension 04/19/2008  . OA (osteoarthritis of spine) 04/19/2008  . OSTEOPENIA 04/19/2008    MScot Jun  PT, DPT, OCS, ATC 06/12/20  9:18 AM    [facility] [address] [address]Phone: [phone]  Fax:  [phone] Name: BJOURNEI THOMASSENMRN: [id number] of Birth: [birth year]

## 2020-10-17 ENCOUNTER — Other Ambulatory Visit: Payer: Self-pay | Admitting: Internal Medicine

## 2020-10-17 DIAGNOSIS — Z1231 Encounter for screening mammogram for malignant neoplasm of breast: Secondary | ICD-10-CM

## 2020-11-06 ENCOUNTER — Telehealth: Payer: Self-pay | Admitting: Internal Medicine

## 2020-11-06 NOTE — Telephone Encounter (Signed)
    Patient requesting refill for  potassium chloride SA (KLOR-CON) 20 MEQ tablet  losartan (COZAAR) 100 MG tablet  indapamide (LOZOL) 1.25 MG tablet  eszopiclone (LUNESTA) 2 MG TABS tablet  atorvastatin (LIPITOR) 20 MG tablet   [place] by Mail   Next appointment 8/17

## 2020-11-07 ENCOUNTER — Other Ambulatory Visit: Payer: Self-pay | Admitting: Internal Medicine

## 2020-11-07 DIAGNOSIS — E785 Hyperlipidemia, unspecified: Secondary | ICD-10-CM

## 2020-11-07 DIAGNOSIS — G4701 Insomnia due to medical condition: Secondary | ICD-10-CM

## 2020-11-07 DIAGNOSIS — I10 Essential (primary) hypertension: Secondary | ICD-10-CM

## 2020-11-07 DIAGNOSIS — E118 Type 2 diabetes mellitus with unspecified complications: Secondary | ICD-10-CM

## 2020-11-07 DIAGNOSIS — E876 Hypokalemia: Secondary | ICD-10-CM

## 2020-11-07 MED ORDER — LOSARTAN POTASSIUM 100 MG PO TABS: 100.0000 mg | ORAL_TABLET | Freq: Every day | ORAL | 0 refills | Status: DC

## 2020-11-07 MED ORDER — ESZOPICLONE 2 MG PO TABS: 2.0000 mg | ORAL_TABLET | Freq: Every evening | ORAL | 1 refills | Status: DC | PRN

## 2020-11-07 MED ORDER — INDAPAMIDE 1.25 MG PO TABS: 1.2500 mg | ORAL_TABLET | Freq: Every day | ORAL | 0 refills | Status: DC

## 2020-11-07 MED ORDER — POTASSIUM CHLORIDE CRYS ER 20 MEQ PO TBCR: 20.0000 meq | EXTENDED_RELEASE_TABLET | Freq: Two times a day (BID) | ORAL | 0 refills | Status: DC

## 2020-11-07 MED ORDER — ATORVASTATIN CALCIUM 20 MG PO TABS: 20.0000 mg | ORAL_TABLET | Freq: Every day | ORAL | 0 refills | Status: DC

## 2020-11-13 ENCOUNTER — Other Ambulatory Visit: Payer: Self-pay

## 2020-11-13 ENCOUNTER — Ambulatory Visit (INDEPENDENT_AMBULATORY_CARE_PROVIDER_SITE_OTHER): Payer: Medicare Other | Admitting: Internal Medicine

## 2020-11-13 ENCOUNTER — Encounter: Payer: Self-pay | Admitting: Internal Medicine

## 2020-11-13 VITALS — BP 128/86 | HR 73 | Temp 98.1°F | Ht 66.0 in | Wt 188.0 lb

## 2020-11-13 DIAGNOSIS — G8929 Other chronic pain: Secondary | ICD-10-CM | POA: Diagnosis not present

## 2020-11-13 DIAGNOSIS — Z0001 Encounter for general adult medical examination with abnormal findings: Secondary | ICD-10-CM

## 2020-11-13 DIAGNOSIS — E876 Hypokalemia: Secondary | ICD-10-CM

## 2020-11-13 DIAGNOSIS — E118 Type 2 diabetes mellitus with unspecified complications: Secondary | ICD-10-CM

## 2020-11-13 DIAGNOSIS — I10 Essential (primary) hypertension: Secondary | ICD-10-CM

## 2020-11-13 DIAGNOSIS — E785 Hyperlipidemia, unspecified: Secondary | ICD-10-CM

## 2020-11-13 DIAGNOSIS — G4701 Insomnia due to medical condition: Secondary | ICD-10-CM | POA: Diagnosis not present

## 2020-11-13 DIAGNOSIS — M545 Low back pain, unspecified: Secondary | ICD-10-CM | POA: Diagnosis not present

## 2020-11-13 DIAGNOSIS — Z23 Encounter for immunization: Secondary | ICD-10-CM

## 2020-11-13 LAB — URINALYSIS, ROUTINE W REFLEX MICROSCOPIC
Bilirubin Urine: NEGATIVE
Hgb urine dipstick: NEGATIVE
Ketones, ur: NEGATIVE
Leukocytes,Ua: NEGATIVE
Nitrite: NEGATIVE
RBC / HPF: NONE SEEN (ref 0–?)
Specific Gravity, Urine: 1.025 (ref 1.000–1.030)
Total Protein, Urine: NEGATIVE
Urine Glucose: NEGATIVE
Urobilinogen, UA: 0.2 (ref 0.0–1.0)
pH: 5.5 (ref 5.0–8.0)

## 2020-11-13 LAB — LIPID PANEL
Cholesterol: 241 mg/dL — ABNORMAL HIGH (ref 0–200)
HDL: 69.1 mg/dL (ref >39.00)
LDL Cholesterol: 151 mg/dL — ABNORMAL HIGH (ref 0–99)
NonHDL: 172.31
Total CHOL/HDL Ratio: 3
Triglycerides: 108 mg/dL (ref 0.0–149.0)
VLDL: 21.6 mg/dL (ref 0.0–40.0)

## 2020-11-13 LAB — CBC WITH DIFFERENTIAL/PLATELET
Basophils Absolute: 0 10*3/uL (ref 0.0–0.1)
Basophils Relative: 0.5 % (ref 0.0–3.0)
Eosinophils Absolute: 0.1 10*3/uL (ref 0.0–0.7)
Eosinophils Relative: 3.7 % (ref 0.0–5.0)
HCT: 36.5 % (ref 36.0–46.0)
Hemoglobin: 12 g/dL (ref 12.0–15.0)
Lymphocytes Relative: 31.7 % (ref 12.0–46.0)
Lymphs Abs: 1.2 10*3/uL (ref 0.7–4.0)
MCHC: 32.8 g/dL (ref 30.0–36.0)
MCV: 88.9 fl (ref 78.0–100.0)
Monocytes Absolute: 0.4 10*3/uL (ref 0.1–1.0)
Monocytes Relative: 10.2 % (ref 3.0–12.0)
Neutro Abs: 2.1 10*3/uL (ref 1.4–7.7)
Neutrophils Relative %: 53.9 % (ref 43.0–77.0)
Platelets: 253 10*3/uL (ref 150.0–400.0)
RBC: 4.11 Mil/uL (ref 3.87–5.11)
RDW: 14.8 % (ref 11.5–15.5)
WBC: 3.9 10*3/uL — ABNORMAL LOW (ref 4.0–10.5)

## 2020-11-13 LAB — HEMOGLOBIN A1C: Hgb A1c MFr Bld: 6.5 % (ref 4.6–6.5)

## 2020-11-13 LAB — BASIC METABOLIC PANEL
BUN: 19 mg/dL (ref 6–23)
CO2: 29 mEq/L (ref 19–32)
Calcium: 9.3 mg/dL (ref 8.4–10.5)
Chloride: 104 mEq/L (ref 96–112)
Creatinine, Ser: 0.95 mg/dL (ref 0.40–1.20)
GFR: 57.39 mL/min — ABNORMAL LOW (ref >60.00)
Glucose, Bld: 92 mg/dL (ref 70–99)
Potassium: 3.9 mEq/L (ref 3.5–5.1)
Sodium: 141 mEq/L (ref 135–145)

## 2020-11-13 LAB — HEPATIC FUNCTION PANEL
ALT: 13 U/L (ref 0–35)
AST: 13 U/L (ref 0–37)
Albumin: 4.1 g/dL (ref 3.5–5.2)
Alkaline Phosphatase: 49 U/L (ref 39–117)
Bilirubin, Direct: 0.1 mg/dL (ref 0.0–0.3)
Total Bilirubin: 0.7 mg/dL (ref 0.2–1.2)
Total Protein: 7.5 g/dL (ref 6.0–8.3)

## 2020-11-13 LAB — MICROALBUMIN / CREATININE URINE RATIO
Creatinine,U: 139.3 mg/dL
Microalb Creat Ratio: 0.5 mg/g (ref 0.0–30.0)
Microalb, Ur: <0.7 mg/dL (ref 0.0–1.9)

## 2020-11-13 LAB — TSH: TSH: 1.45 u[IU]/mL (ref 0.35–5.50)

## 2020-11-13 MED ORDER — LIDOCAINE 5 % EX PTCH: 1.0000 | MEDICATED_PATCH | CUTANEOUS | 1 refills | Status: DC

## 2020-11-13 MED ORDER — BOOSTRIX 5-2.5-18.5 LF-MCG/0.5 IM SUSP: 0.5000 mL | Freq: Once | INTRAMUSCULAR | 0 refills | Status: AC

## 2020-11-13 NOTE — Patient Instructions (Signed)
Health Maintenance, Female Adopting a healthy lifestyle and getting preventive care are important in promoting health and wellness. Ask your health care provider about: The right schedule for you to have regular tests and exams. Things you can do on your own to prevent diseases and keep yourself healthy. What should I know about diet, weight, and exercise? Eat a healthy diet  Eat a diet that includes plenty of vegetables, fruits, low-fat dairy products, and lean protein. Do not eat a lot of foods that are high in solid fats, added sugars, or sodium.  Maintain a healthy weight Body mass index (BMI) is used to identify weight problems. It estimates body fat based on height and weight. Your health care provider can help determineyour BMI and help you achieve or maintain a healthy weight. Get regular exercise Get regular exercise. This is one of the most important things you can do for your health. Most adults should: Exercise for at least 150 minutes each week. The exercise should increase your heart rate and make you sweat (moderate-intensity exercise). Do strengthening exercises at least twice a week. This is in addition to the moderate-intensity exercise. Spend less time sitting. Even light physical activity can be beneficial. Watch cholesterol and blood lipids Have your blood tested for lipids and cholesterol at 78 years of age, then havethis test every 5 years. Have your cholesterol levels checked more often if: Your lipid or cholesterol levels are high. You are older than 78 years of age. You are at high risk for heart disease. What should I know about cancer screening? Depending on your health history and family history, you may need to have cancer screening at various ages. This may include screening for: Breast cancer. Cervical cancer. Colorectal cancer. Skin cancer. Lung cancer. What should I know about heart disease, diabetes, and high blood pressure? Blood pressure and heart  disease High blood pressure causes heart disease and increases the risk of stroke. This is more likely to develop in people who have high blood pressure readings, are of African descent, or are overweight. Have your blood pressure checked: Every 3-5 years if you are 18-39 years of age. Every year if you are 40 years old or older. Diabetes Have regular diabetes screenings. This checks your fasting blood sugar level. Have the screening done: Once every three years after age 40 if you are at a normal weight and have a low risk for diabetes. More often and at a younger age if you are overweight or have a high risk for diabetes. What should I know about preventing infection? Hepatitis B If you have a higher risk for hepatitis B, you should be screened for this virus. Talk with your health care provider to find out if you are at risk forhepatitis B infection. Hepatitis C Testing is recommended for: Everyone born from 1945 through 1965. Anyone with known risk factors for hepatitis C. Sexually transmitted infections (STIs) Get screened for STIs, including gonorrhea and chlamydia, if: You are sexually active and are younger than 78 years of age. You are older than 78 years of age and your health care provider tells you that you are at risk for this type of infection. Your sexual activity has changed since you were last screened, and you are at increased risk for chlamydia or gonorrhea. Ask your health care provider if you are at risk. Ask your health care provider about whether you are at high risk for HIV. Your health care provider may recommend a prescription medicine to help   prevent HIV infection. If you choose to take medicine to prevent HIV, you should first get tested for HIV. You should then be tested every 3 months for as long as you are taking the medicine. Pregnancy If you are about to stop having your period (premenopausal) and you may become pregnant, seek counseling before you get  pregnant. Take 400 to 800 micrograms (mcg) of folic acid every day if you become pregnant. Ask for birth control (contraception) if you want to prevent pregnancy. Osteoporosis and menopause Osteoporosis is a disease in which the bones lose minerals and strength with aging. This can result in bone fractures. If you are 65 years old or older, or if you are at risk for osteoporosis and fractures, ask your health care provider if you should: Be screened for bone loss. Take a calcium or vitamin D supplement to lower your risk of fractures. Be given hormone replacement therapy (HRT) to treat symptoms of menopause. Follow these instructions at home: Lifestyle Do not use any products that contain nicotine or tobacco, such as cigarettes, e-cigarettes, and chewing tobacco. If you need help quitting, ask your health care provider. Do not use street drugs. Do not share needles. Ask your health care provider for help if you need support or information about quitting drugs. Alcohol use Do not drink alcohol if: Your health care provider tells you not to drink. You are pregnant, may be pregnant, or are planning to become pregnant. If you drink alcohol: Limit how much you use to 0-1 drink a day. Limit intake if you are breastfeeding. Be aware of how much alcohol is in your drink. In the [place] one drink equals one 12 oz bottle of beer (355 mL), one 5 oz glass of wine (148 mL), or one 1 oz glass of hard liquor (44 mL). General instructions Schedule regular health, dental, and eye exams. Stay current with your vaccines. Tell your health care provider if: You often feel depressed. You have ever been abused or do not feel safe at home. Summary Adopting a healthy lifestyle and getting preventive care are important in promoting health and wellness. Follow your health care provider's instructions about healthy diet, exercising, and getting tested or screened for diseases. Follow your health care provider's  instructions on monitoring your cholesterol and blood pressure. This information is not intended to replace advice given to you by your health care provider. Make sure you discuss any questions you have with your healthcare provider. Document Revised: 03/09/2018 Document Reviewed: 03/09/2018 Elsevier Patient Education  2022 Elsevier Inc.  

## 2020-11-13 NOTE — Progress Notes (Signed)
Subjective:  Patient ID: Alexandria [name], female    DOB: 11-05-1940  Age: 80 y.o. MRN: [id number]  CC: Annual Exam, Back Pain, Hypertension, Diabetes, and Hyperlipidemia  This visit occurred during the SARS-CoV-2 public health emergency.  Safety protocols were in place, including screening questions prior to the visit, additional usage of staff PPE, and extensive cleaning of exam room while observing appropriate contact time as indicated for disinfecting solutions.    HPI YANELY VALLADOLID presents for a CPX and f/up -   She complains of chronic low back pain that radiates into her hips.  She would like to try a lidocaine patch.  She denies lower extremity paresthesias.  She is active and denies any recent episodes of headache, blurred vision, chest pain, shortness of breath, dyspnea on exertion, diaphoresis, edema, or fatigue.  Outpatient Medications Prior to Visit  Medication Sig Dispense Refill   atorvastatin (LIPITOR) 20 MG tablet Take 1 tablet (20 mg total) by mouth daily. 90 tablet 0   Bimatoprost (LUMIGAN OP) Apply to eye.     cyclobenzaprine (FLEXERIL) 5 MG tablet Take 1 tablet (5 mg total) by mouth 3 (three) times daily as needed for muscle spasms. 270 tablet 0   eszopiclone (LUNESTA) 2 MG TABS tablet Take 1 tablet (2 mg total) by mouth at bedtime as needed for sleep. Take immediately before bedtime 90 tablet 1   indapamide (LOZOL) 1.25 MG tablet Take 1 tablet (1.25 mg total) by mouth daily. 90 tablet 0   losartan (COZAAR) 100 MG tablet Take 1 tablet (100 mg total) by mouth daily. 90 tablet 0   nabumetone (RELAFEN) 500 MG tablet TAKE 1 TABLET BY MOUTH TWICE A DAY AS NEEDED 180 tablet 0   potassium chloride SA (KLOR-CON) 20 MEQ tablet Take 1 tablet (20 mEq total) by mouth 2 (two) times daily. 180 tablet 0   Turmeric 500 MG CAPS Take 1 capsule by mouth daily. 90 capsule 1   gabapentin (NEURONTIN) 100 MG capsule Take 1-3 capsules (100-300 mg total) by mouth 3 (three) times daily as needed  (nerve pain). 90 capsule 3   Iron-FA-B Cmp-C-Biot-Probiotic (FUSION PLUS) CAPS Take 1 capsule by mouth daily. 90 capsule 1   No facility-administered medications prior to visit.    ROS Review of Systems  Constitutional:  Negative for diaphoresis and fatigue.  HENT: Negative.    Eyes: Negative.   Respiratory:  Negative for cough, chest tightness, shortness of breath and wheezing.   Cardiovascular:  Negative for chest pain, palpitations and leg swelling.  Gastrointestinal:  Negative for abdominal pain, diarrhea and nausea.  Endocrine: Negative.   Genitourinary: Negative.  Negative for difficulty urinating.  Musculoskeletal:  Positive for back pain. Negative for arthralgias and myalgias.  Skin: Negative.   Neurological: Negative.  Negative for dizziness, weakness, light-headedness, numbness and headaches.  Hematological:  Negative for adenopathy. Does not bruise/bleed easily.  Psychiatric/Behavioral: Negative.     Objective:  BP 128/86 (BP Location: Left Arm, Patient Position: Sitting, Cuff Size: Large)   Pulse 73   Temp 98.1 F (36.7 C) (Oral)   Ht '5\' 6"'$  (1.676 m)   Wt 188 lb (85.3 kg)   SpO2 98%   BMI 30.34 kg/m   BP Readings from Last 3 Encounters:  11/13/20 128/86  04/23/20 138/62  04/09/20 (!) 156/82    Wt Readings from Last 3 Encounters:  11/13/20 188 lb (85.3 kg)  04/23/20 185 lb 9.6 oz (84.2 kg)  04/09/20 186 lb 15.2 oz (84.8 kg)  Physical Exam Vitals reviewed.  HENT:     Nose: Nose normal.     Mouth/Throat:     Mouth: Mucous membranes are moist.  Eyes:     General: No scleral icterus.    Conjunctiva/sclera: Conjunctivae normal.  Cardiovascular:     Rate and Rhythm: Normal rate and regular rhythm.     Heart sounds: No murmur heard. Pulmonary:     Effort: Pulmonary effort is normal.     Breath sounds: No stridor. No wheezing, rhonchi or rales.  Abdominal:     General: Abdomen is flat.     Palpations: There is no mass.     Tenderness: There is no  abdominal tenderness. There is no guarding.     Hernia: No hernia is present.  Musculoskeletal:        General: Normal range of motion.     Cervical back: Normal and neck supple.     Thoracic back: Normal.     Lumbar back: Normal. No tenderness or bony tenderness. Normal range of motion. Negative right straight leg raise test and negative left straight leg raise test.     Right lower leg: No edema.     Left lower leg: No edema.  Lymphadenopathy:     Cervical: No cervical adenopathy.  Skin:    General: Skin is warm and dry.  Neurological:     General: No focal deficit present.     Mental Status: She is alert.  Psychiatric:        Mood and Affect: Mood normal.        Behavior: Behavior normal.    Lab Results  Component Value Date   WBC 3.9 (L) 11/13/2020   HGB 12.0 11/13/2020   HCT 36.5 11/13/2020   PLT 253.0 11/13/2020   GLUCOSE 92 11/13/2020   CHOL 241 (H) 11/13/2020   TRIG 108.0 11/13/2020   HDL 69.10 11/13/2020   LDLCALC 151 (H) 11/13/2020   ALT 13 11/13/2020   AST 13 11/13/2020   NA 141 11/13/2020   K 3.9 11/13/2020   CL 104 11/13/2020   CREATININE 0.95 11/13/2020   BUN 19 11/13/2020   CO2 29 11/13/2020   TSH 1.45 11/13/2020   HGBA1C 6.5 11/13/2020   MICROALBUR <0.7 11/13/2020    MM 3D SCREEN BREAST BILATERAL  Result Date: 01/17/2020 CLINICAL DATA:  Screening. EXAM: DIGITAL SCREENING BILATERAL MAMMOGRAM WITH TOMO AND CAD COMPARISON:  Previous exam(s). ACR Breast Density Category c: The breast tissue is heterogeneously dense, which may obscure small masses. FINDINGS: There are no findings suspicious for malignancy. Images were processed with CAD. IMPRESSION: No mammographic evidence of malignancy. A result letter of this screening mammogram will be mailed directly to the patient. RECOMMENDATION: Screening mammogram in one year. (Code:SM-B-01Y) BI-RADS CATEGORY  1: Negative. Electronically Signed   By: Kristopher Oppenheim M.D.   On: 01/17/2020 16:13    Assessment &  Plan:   Warda was seen today for annual exam, back pain, hypertension, diabetes and hyperlipidemia.  Diagnoses and all orders for this visit:  Insomnia secondary to chronic pain -     lidocaine (LIDODERM) 5 %; Place 1 patch onto the skin daily. Remove & Discard patch within 12 hours or as directed by MD  Essential hypertension- Her blood pressure is adequately well controlled. -     CBC with Differential/Platelet; Future -     Basic metabolic panel; Future -     Urinalysis, Routine w reflex microscopic; Future -     TSH;  Future -     TSH -     Urinalysis, Routine w reflex microscopic -     Basic metabolic panel -     CBC with Differential/Platelet  Type II diabetes mellitus with manifestations ([place])- Her blood sugar is adequately well controlled. -     Hemoglobin A1c; Future -     Microalbumin / creatinine urine ratio; Future -     Microalbumin / creatinine urine ratio -     Hemoglobin A1c  Hypokalemia- Her potassium level is normal. -     Basic metabolic panel; Future -     Basic metabolic panel  Chronic bilateral low back pain without sciatica -     lidocaine (LIDODERM) 5 %; Place 1 patch onto the skin daily. Remove & Discard patch within 12 hours or as directed by MD  Need for prophylactic vaccination with combined diphtheria-tetanus-pertussis (DTP) vaccine -     Tdap (BOOSTRIX) 5-2.5-18.5 LF-MCG/0.5 injection; Inject 0.5 mLs into the muscle once for 1 dose.  Hyperlipidemia with target LDL less than 100- She has not achieved her LDL goal.  I recommended that she be more compliant with the statin. -     Lipid panel; Future -     TSH; Future -     Hepatic function panel; Future -     Hepatic function panel -     TSH -     Lipid panel  Encounter for general adult medical examination with abnormal findings- Exam completed, labs reviewed, vaccines reviewed and updated, no cancer screenings indicated patient education was given.  I have discontinued Roderic Ovens. Hey's Fusion  Plus and gabapentin. I am also having her start on lidocaine and Boostrix. Additionally, I am having her maintain her Bimatoprost (LUMIGAN OP), Turmeric, nabumetone, cyclobenzaprine, atorvastatin, eszopiclone, indapamide, potassium chloride SA, and losartan.  Meds ordered this encounter  Medications   lidocaine (LIDODERM) 5 %    Sig: Place 1 patch onto the skin daily. Remove & Discard patch within 12 hours or as directed by MD    Dispense:  90 patch    Refill:  1   Tdap (BOOSTRIX) 5-2.5-18.5 LF-MCG/0.5 injection    Sig: Inject 0.5 mLs into the muscle once for 1 dose.    Dispense:  0.5 mL    Refill:  0     Follow-up: Return in about 6 months (around 05/16/2021).  Scarlette Calico, MD

## 2020-11-14 DIAGNOSIS — Z0001 Encounter for general adult medical examination with abnormal findings: Secondary | ICD-10-CM | POA: Insufficient documentation

## 2020-11-14 DIAGNOSIS — Z23 Encounter for immunization: Secondary | ICD-10-CM | POA: Insufficient documentation

## 2020-11-15 ENCOUNTER — Other Ambulatory Visit: Payer: Self-pay | Admitting: Internal Medicine

## 2020-11-15 DIAGNOSIS — D508 Other iron deficiency anemias: Secondary | ICD-10-CM

## 2020-12-03 DIAGNOSIS — H401131 Primary open-angle glaucoma, bilateral, mild stage: Secondary | ICD-10-CM | POA: Diagnosis not present

## 2020-12-03 DIAGNOSIS — H16223 Keratoconjunctivitis sicca, not specified as Sjogren's, bilateral: Secondary | ICD-10-CM | POA: Diagnosis not present

## 2020-12-12 ENCOUNTER — Telehealth: Payer: Self-pay | Admitting: Internal Medicine

## 2020-12-12 DIAGNOSIS — M51369 Other intervertebral disc degeneration, lumbar region without mention of lumbar back pain or lower extremity pain: Secondary | ICD-10-CM

## 2020-12-12 DIAGNOSIS — M8949 Other hypertrophic osteoarthropathy, multiple sites: Secondary | ICD-10-CM

## 2020-12-12 DIAGNOSIS — M4710 Other spondylosis with myelopathy, site unspecified: Secondary | ICD-10-CM

## 2020-12-12 DIAGNOSIS — M150 Primary generalized (osteo)arthritis: Secondary | ICD-10-CM

## 2020-12-12 DIAGNOSIS — M159 Polyosteoarthritis, unspecified: Secondary | ICD-10-CM

## 2020-12-12 DIAGNOSIS — M5136 Other intervertebral disc degeneration, lumbar region: Secondary | ICD-10-CM

## 2020-12-12 MED ORDER — NABUMETONE 500 MG PO TABS: 500.0000 mg | ORAL_TABLET | Freq: Two times a day (BID) | ORAL | 0 refills | Status: DC | PRN

## 2020-12-12 NOTE — Telephone Encounter (Signed)
Patient requesting refill for nabumetone (RELAFEN) 500 MG tablet   Pharmacy [facility] - [place], [place] - [address]

## 2021-01-17 ENCOUNTER — Ambulatory Visit: Payer: Medicare Other

## 2021-01-23 ENCOUNTER — Other Ambulatory Visit: Payer: Self-pay

## 2021-01-23 ENCOUNTER — Ambulatory Visit
Admission: RE | Admit: 2021-01-23 | Discharge: 2021-01-23 | Disposition: A | Discharge status: Home / Self Care | Payer: Medicare Other | Source: Ambulatory Visit | Attending: Internal Medicine | Admitting: Internal Medicine

## 2021-01-23 DIAGNOSIS — Z1231 Encounter for screening mammogram for malignant neoplasm of breast: Secondary | ICD-10-CM

## 2021-02-28 ENCOUNTER — Telehealth: Payer: Self-pay | Admitting: Internal Medicine

## 2021-02-28 NOTE — Telephone Encounter (Signed)
I have spoke to the pt. She stated that she is feeling much better. She said she had came down with something from her daughter while she while she was in town for the holidays.

## 2021-02-28 NOTE — Telephone Encounter (Signed)
Pt. Called and states that she is fatigued, has a sore throat, no taste, chills and slight headache at night. States she took COVID booster and flu shot on Wednesday, and wants to know if it's too early to go out. Suspects she may have the flu or COVID, but it may just be resulting from shots.     Callback #- [phone]

## 2021-04-03 DIAGNOSIS — H16223 Keratoconjunctivitis sicca, not specified as Sjogren's, bilateral: Secondary | ICD-10-CM | POA: Diagnosis not present

## 2021-04-03 DIAGNOSIS — H401131 Primary open-angle glaucoma, bilateral, mild stage: Secondary | ICD-10-CM | POA: Diagnosis not present

## 2021-04-15 ENCOUNTER — Telehealth: Payer: Self-pay | Admitting: Internal Medicine

## 2021-04-15 NOTE — Telephone Encounter (Signed)
N/A unable to leave a message for patient to call back to schedule Medicare Annual Wellness Visit   Last AWV  04/09/20  Please schedule at anytime with [facility] if patient calls the office back.    40 Minutes appointment   Any questions, please call me at [phone]

## 2021-04-28 ENCOUNTER — Other Ambulatory Visit: Payer: Self-pay | Admitting: Internal Medicine

## 2021-04-28 ENCOUNTER — Telehealth: Payer: Self-pay

## 2021-04-28 DIAGNOSIS — E118 Type 2 diabetes mellitus with unspecified complications: Secondary | ICD-10-CM

## 2021-04-28 DIAGNOSIS — I10 Essential (primary) hypertension: Secondary | ICD-10-CM

## 2021-04-28 DIAGNOSIS — M5136 Other intervertebral disc degeneration, lumbar region: Secondary | ICD-10-CM

## 2021-04-28 DIAGNOSIS — M159 Polyosteoarthritis, unspecified: Secondary | ICD-10-CM

## 2021-04-28 DIAGNOSIS — M4710 Other spondylosis with myelopathy, site unspecified: Secondary | ICD-10-CM

## 2021-04-28 DIAGNOSIS — G8929 Other chronic pain: Secondary | ICD-10-CM

## 2021-04-28 DIAGNOSIS — M545 Low back pain, unspecified: Secondary | ICD-10-CM

## 2021-04-28 DIAGNOSIS — G4701 Insomnia due to medical condition: Secondary | ICD-10-CM

## 2021-04-28 DIAGNOSIS — E785 Hyperlipidemia, unspecified: Secondary | ICD-10-CM

## 2021-04-28 MED ORDER — ESZOPICLONE 2 MG PO TABS: 2.0000 mg | ORAL_TABLET | Freq: Every evening | ORAL | 1 refills | Status: DC | PRN

## 2021-04-28 MED ORDER — CYCLOBENZAPRINE HCL 5 MG PO TABS: 5.0000 mg | ORAL_TABLET | Freq: Three times a day (TID) | ORAL | 0 refills | Status: DC | PRN

## 2021-04-28 MED ORDER — LOSARTAN POTASSIUM 100 MG PO TABS: 100.0000 mg | ORAL_TABLET | Freq: Every day | ORAL | 0 refills | Status: DC

## 2021-04-28 MED ORDER — INDAPAMIDE 1.25 MG PO TABS: 1.2500 mg | ORAL_TABLET | Freq: Every day | ORAL | 0 refills | Status: DC

## 2021-04-28 MED ORDER — NABUMETONE 500 MG PO TABS: 500.0000 mg | ORAL_TABLET | Freq: Two times a day (BID) | ORAL | 0 refills | Status: DC | PRN

## 2021-04-28 MED ORDER — ATORVASTATIN CALCIUM 20 MG PO TABS: 20.0000 mg | ORAL_TABLET | Freq: Every day | ORAL | 0 refills | Status: DC

## 2021-04-28 NOTE — Telephone Encounter (Signed)
Pt calling in to request a refill on: nabumetone (RELAFEN) 500 MG tablet indapamide (LOZOL) 1.25 MG tablet eszopiclone (LUNESTA) 2 MG TABS tablet atorvastatin (LIPITOR) 20 MG tablet cyclobenzaprine (FLEXERIL) 5 MG tablet losartan (COZAAR) 100 MG tablet  Pharmacy: [facility] [place], [place] - [address]  [place]  11/13/20 Pt scheduled F/U appt for 06/12/21  Pt CB [phone]

## 2021-05-05 ENCOUNTER — Other Ambulatory Visit: Payer: Self-pay | Admitting: Internal Medicine

## 2021-05-05 DIAGNOSIS — E785 Hyperlipidemia, unspecified: Secondary | ICD-10-CM

## 2021-05-05 DIAGNOSIS — M159 Polyosteoarthritis, unspecified: Secondary | ICD-10-CM

## 2021-05-05 DIAGNOSIS — M5136 Other intervertebral disc degeneration, lumbar region: Secondary | ICD-10-CM

## 2021-05-05 DIAGNOSIS — M545 Low back pain, unspecified: Secondary | ICD-10-CM

## 2021-05-05 DIAGNOSIS — E876 Hypokalemia: Secondary | ICD-10-CM

## 2021-05-05 DIAGNOSIS — I10 Essential (primary) hypertension: Secondary | ICD-10-CM

## 2021-05-05 DIAGNOSIS — E118 Type 2 diabetes mellitus with unspecified complications: Secondary | ICD-10-CM

## 2021-05-05 DIAGNOSIS — M4710 Other spondylosis with myelopathy, site unspecified: Secondary | ICD-10-CM

## 2021-05-06 MED ORDER — ATORVASTATIN CALCIUM 20 MG PO TABS: 20.0000 mg | ORAL_TABLET | Freq: Every day | ORAL | 0 refills | Status: DC

## 2021-05-06 MED ORDER — NABUMETONE 500 MG PO TABS: 500.0000 mg | ORAL_TABLET | Freq: Two times a day (BID) | ORAL | 0 refills | Status: DC | PRN

## 2021-05-06 MED ORDER — INDAPAMIDE 1.25 MG PO TABS: 1.2500 mg | ORAL_TABLET | Freq: Every day | ORAL | 0 refills | Status: DC

## 2021-05-06 MED ORDER — CYCLOBENZAPRINE HCL 5 MG PO TABS: 5.0000 mg | ORAL_TABLET | Freq: Three times a day (TID) | ORAL | 0 refills | Status: DC | PRN

## 2021-05-06 MED ORDER — POTASSIUM CHLORIDE CRYS ER 20 MEQ PO TBCR: 20.0000 meq | EXTENDED_RELEASE_TABLET | Freq: Two times a day (BID) | ORAL | 0 refills | Status: DC

## 2021-05-06 MED ORDER — LOSARTAN POTASSIUM 100 MG PO TABS: 100.0000 mg | ORAL_TABLET | Freq: Every day | ORAL | 0 refills | Status: DC

## 2021-06-12 ENCOUNTER — Ambulatory Visit: Payer: Medicare Other | Admitting: Internal Medicine

## 2021-06-12 ENCOUNTER — Encounter: Payer: Self-pay | Admitting: Internal Medicine

## 2021-06-12 ENCOUNTER — Other Ambulatory Visit: Payer: Self-pay

## 2021-06-12 ENCOUNTER — Other Ambulatory Visit: Payer: Self-pay | Admitting: Internal Medicine

## 2021-06-12 ENCOUNTER — Ambulatory Visit (INDEPENDENT_AMBULATORY_CARE_PROVIDER_SITE_OTHER): Payer: Medicare Other | Admitting: Internal Medicine

## 2021-06-12 VITALS — BP 142/62 | HR 77 | Temp 98.0°F | Resp 16 | Ht 66.0 in | Wt 191.0 lb

## 2021-06-12 DIAGNOSIS — L2084 Intrinsic (allergic) eczema: Secondary | ICD-10-CM

## 2021-06-12 DIAGNOSIS — M21612 Bunion of left foot: Secondary | ICD-10-CM

## 2021-06-12 DIAGNOSIS — I10 Essential (primary) hypertension: Secondary | ICD-10-CM

## 2021-06-12 DIAGNOSIS — N1831 Chronic kidney disease, stage 3a: Secondary | ICD-10-CM

## 2021-06-12 DIAGNOSIS — R011 Cardiac murmur, unspecified: Secondary | ICD-10-CM

## 2021-06-12 DIAGNOSIS — E118 Type 2 diabetes mellitus with unspecified complications: Secondary | ICD-10-CM

## 2021-06-12 DIAGNOSIS — R9431 Abnormal electrocardiogram [ECG] [EKG]: Secondary | ICD-10-CM | POA: Insufficient documentation

## 2021-06-12 DIAGNOSIS — R0609 Other forms of dyspnea: Secondary | ICD-10-CM | POA: Diagnosis not present

## 2021-06-12 LAB — CBC WITH DIFFERENTIAL/PLATELET
Basophils Absolute: 0 10*3/uL (ref 0.0–0.1)
Basophils Relative: 0.7 % (ref 0.0–3.0)
Eosinophils Absolute: 0.2 10*3/uL (ref 0.0–0.7)
Eosinophils Relative: 3.6 % (ref 0.0–5.0)
HCT: 34.8 % — ABNORMAL LOW (ref 36.0–46.0)
Hemoglobin: 11.3 g/dL — ABNORMAL LOW (ref 12.0–15.0)
Lymphocytes Relative: 30 % (ref 12.0–46.0)
Lymphs Abs: 1.4 10*3/uL (ref 0.7–4.0)
MCHC: 32.5 g/dL (ref 30.0–36.0)
MCV: 88.9 fl (ref 78.0–100.0)
Monocytes Absolute: 0.4 10*3/uL (ref 0.1–1.0)
Monocytes Relative: 8.9 % (ref 3.0–12.0)
Neutro Abs: 2.7 10*3/uL (ref 1.4–7.7)
Neutrophils Relative %: 56.8 % (ref 43.0–77.0)
Platelets: 264 10*3/uL (ref 150.0–400.0)
RBC: 3.91 Mil/uL (ref 3.87–5.11)
RDW: 14.9 % (ref 11.5–15.5)
WBC: 4.8 10*3/uL (ref 4.0–10.5)

## 2021-06-12 LAB — BASIC METABOLIC PANEL
BUN: 26 mg/dL — ABNORMAL HIGH (ref 6–23)
CO2: 31 mEq/L (ref 19–32)
Calcium: 9.3 mg/dL (ref 8.4–10.5)
Chloride: 105 mEq/L (ref 96–112)
Creatinine, Ser: 1.01 mg/dL (ref 0.40–1.20)
GFR: 53.11 mL/min — ABNORMAL LOW (ref >60.00)
Glucose, Bld: 102 mg/dL — ABNORMAL HIGH (ref 70–99)
Potassium: 3.7 mEq/L (ref 3.5–5.1)
Sodium: 142 mEq/L (ref 135–145)

## 2021-06-12 LAB — HEMOGLOBIN A1C: Hgb A1c MFr Bld: 6.5 % (ref 4.6–6.5)

## 2021-06-12 LAB — TROPONIN I (HIGH SENSITIVITY): High Sens Troponin I: 9 ng/L (ref 2–17)

## 2021-06-12 MED ORDER — TRIAMCINOLONE ACETONIDE 0.5 % EX CREA
1.0000 | TOPICAL_CREAM | Freq: Three times a day (TID) | CUTANEOUS | 3 refills | Status: DC
Start: 2021-06-12 — End: 2021-06-16

## 2021-06-12 NOTE — Progress Notes (Signed)
? ?Subjective:  ?Patient ID: Alexandria [name], female    DOB: 22-Mar-1941  Age: 81 y.o. MRN: [id number] ? ?CC: Hypertension and Diabetes ? ?This visit occurred during the SARS-CoV-2 public health emergency.  Safety protocols were in place, including screening questions prior to the visit, additional usage of staff PPE, and extensive cleaning of exam room while observing appropriate contact time as indicated for disinfecting solutions.   ? ?HPI ?Alexandria [name] presents for f/up -  ? ?She complains of a 52-monthhistory of dyspnea on exertion.  She denies chest pain, diaphoresis, dizziness, lightheadedness, palpitations, or edema. ? ?Outpatient Medications Prior to Visit  ?Medication Sig Dispense Refill  ? atorvastatin (LIPITOR) 20 MG tablet Take 1 tablet (20 mg total) by mouth daily. 90 tablet 0  ? Bimatoprost (LUMIGAN OP) Apply to eye.    ? cyclobenzaprine (FLEXERIL) 5 MG tablet Take 1 tablet (5 mg total) by mouth 3 (three) times daily as needed for muscle spasms. 270 tablet 0  ? eszopiclone (LUNESTA) 2 MG TABS tablet Take 1 tablet (2 mg total) by mouth at bedtime as needed for sleep. Take immediately before bedtime 90 tablet 1  ? indapamide (LOZOL) 1.25 MG tablet Take 1 tablet (1.25 mg total) by mouth daily. 90 tablet 0  ? lidocaine (LIDODERM) 5 % Place 1 patch onto the skin daily. Remove & Discard patch within 12 hours or as directed by MD 90 patch 1  ? losartan (COZAAR) 100 MG tablet Take 1 tablet (100 mg total) by mouth daily. 90 tablet 0  ? nabumetone (RELAFEN) 500 MG tablet Take 1 tablet (500 mg total) by mouth 2 (two) times daily as needed. 180 tablet 0  ? potassium chloride SA (KLOR-CON M) 20 MEQ tablet Take 1 tablet (20 mEq total) by mouth 2 (two) times daily. 180 tablet 0  ? Turmeric 500 MG CAPS Take 1 capsule by mouth daily. 90 capsule 1  ? ?No facility-administered medications prior to visit.  ? ? ?ROS ?Review of Systems  ?Constitutional:  Positive for unexpected weight change (wt gain). Negative for chills,  diaphoresis and fatigue.  ?HENT: Negative.    ?Respiratory:  Positive for shortness of breath. Negative for cough, chest tightness and wheezing.   ?Cardiovascular:  Negative for chest pain, palpitations and leg swelling.  ?Gastrointestinal:  Negative for abdominal pain and nausea.  ?Genitourinary: Negative.  Negative for difficulty urinating.  ?Musculoskeletal:  Positive for arthralgias. Negative for myalgias.  ?Skin:  Positive for rash.  ?     Itchy rash on upper back  ?Neurological: Negative.  Negative for dizziness, weakness and light-headedness.  ?Hematological:  Negative for adenopathy. Does not bruise/bleed easily.  ?Psychiatric/Behavioral: Negative.    ? ?Objective:  ?BP (!) 142/62 (BP Location: Left Arm, Patient Position: Sitting, Cuff Size: Large)   Pulse 77   Temp 98 ?F (36.7 ?C) (Oral)   Resp 16   Ht '5\' 6"'$  (1.676 m)   Wt 191 lb (86.6 kg)   SpO2 98%   BMI 30.83 kg/m?  ? ?BP Readings from Last 3 Encounters:  ?06/12/21 (!) 142/62  ?11/13/20 128/86  ?04/23/20 138/62  ? ? ?Wt Readings from Last 3 Encounters:  ?06/12/21 191 lb (86.6 kg)  ?11/13/20 188 lb (85.3 kg)  ?04/23/20 185 lb 9.6 oz (84.2 kg)  ? ? ?Physical Exam ?Vitals reviewed.  ?Constitutional:   ?   Appearance: She is not ill-appearing.  ?HENT:  ?   Nose: Nose normal.  ?   Mouth/Throat:  ?  Mouth: Mucous membranes are moist.  ?Eyes:  ?   General: No scleral icterus. ?   Conjunctiva/sclera: Conjunctivae normal.  ?Cardiovascular:  ?   Rate and Rhythm: Normal rate and regular rhythm.  ?   Heart sounds: Murmur heard.  ?Systolic murmur is present with a grade of 1/6.  ?No diastolic murmur is present.  ?  No friction rub. No gallop.  ?   Comments: 1/6 SEM LUSB ?Pulmonary:  ?   Effort: Pulmonary effort is normal.  ?   Breath sounds: No stridor. No wheezing, rhonchi or rales.  ?Chest:  ?   Comments: EKG- ?NSR, 69 bpm ?Moderate LVH ?TWI in anterior leads is new ?No Q waves ?Abdominal:  ?   General: Abdomen is flat.  ?   Palpations: There is no mass.  ?    Tenderness: There is no abdominal tenderness. There is no guarding.  ?   Hernia: No hernia is present.  ?Musculoskeletal:  ?   Cervical back: Neck supple.  ?   Right lower leg: No edema.  ?   Left lower leg: No edema.  ?Lymphadenopathy:  ?   Cervical: No cervical adenopathy.  ?Skin: ?   General: Skin is warm and dry.  ? ?    ?   Comments: Large patch of papules, xerosis, scale over the upper back  ?Neurological:  ?   General: No focal deficit present.  ?   Mental Status: She is alert.  ?Psychiatric:     ?   Mood and Affect: Mood normal.     ?   Behavior: Behavior normal.  ? ? ?Lab Results  ?Component Value Date  ? WBC 4.8 06/12/2021  ? HGB 11.3 (L) 06/12/2021  ? HCT 34.8 (L) 06/12/2021  ? PLT 264.0 06/12/2021  ? GLUCOSE 102 (H) 06/12/2021  ? CHOL 241 (H) 11/13/2020  ? TRIG 108.0 11/13/2020  ? HDL 69.10 11/13/2020  ? LDLCALC 151 (H) 11/13/2020  ? ALT 13 11/13/2020  ? AST 13 11/13/2020  ? NA 142 06/12/2021  ? K 3.7 06/12/2021  ? CL 105 06/12/2021  ? CREATININE 1.01 06/12/2021  ? BUN 26 (H) 06/12/2021  ? CO2 31 06/12/2021  ? TSH 1.45 11/13/2020  ? HGBA1C 6.5 06/12/2021  ? MICROALBUR <0.7 11/13/2020  ? ? ?MM 3D SCREEN BREAST BILATERAL ? ?Result Date: 01/29/2021 ?CLINICAL DATA:  Screening. EXAM: DIGITAL SCREENING BILATERAL MAMMOGRAM WITH TOMOSYNTHESIS AND CAD TECHNIQUE: Bilateral screening digital craniocaudal and mediolateral oblique mammograms were obtained. Bilateral screening digital breast tomosynthesis was performed. The images were evaluated with computer-aided detection. COMPARISON:  Previous exam(s). ACR Breast Density Category b: There are scattered areas of fibroglandular density. FINDINGS: There are no findings suspicious for malignancy. IMPRESSION: No mammographic evidence of malignancy. A result letter of this screening mammogram will be mailed directly to the patient. RECOMMENDATION: Screening mammogram in one year. (Code:[id number]-B-01Y) BI-RADS CATEGORY  1: Negative. Electronically Signed   By: Lillia Mountain  M.D.   On: 01/29/2021 10:27  ? ? ?Assessment & Plan:  ? ?[name] was seen today for hypertension and diabetes. ? ?Diagnoses and all orders for this visit: ? ?Essential hypertension- Her blood pressure is adequately well controlled. ?-     Basic metabolic panel; Future ?-     CBC with Differential/Platelet; Future ?-     EKG 12-Lead ?-     CBC with Differential/Platelet ?-     Basic metabolic panel ? ?Type II diabetes mellitus with manifestations ([place])- Her blood sugar is adequately well  controlled. ?-     Hemoglobin A1c; Future ?-     Hemoglobin A1c ? ?Stage 3a chronic kidney disease ([place])- Her renal function is stable. ? ?Bunion, left foot ?-     Ambulatory referral to Podiatry ?-     HM Diabetes Foot Exam ? ?Intrinsic eczema ?-     triamcinolone cream (KENALOG) 0.5 %; Apply 1 application. topically 3 (three) times daily. ? ?DOE (dyspnea on exertion)- BNP and troponin are negative.  She has new changes on her EKG.  I recommended an MPI to screen for ischemia. ?-     NT-proBNP; Future ?-     Troponin I (High Sensitivity); Future ?-     Troponin I (High Sensitivity) ?-     NT-proBNP ?-     Cardiac Stress Test: Informed Consent Details: Physician/Practitioner Attestation; Transcribe to consent form and obtain patient signature; Future ? ?Grade 1 out of 6 intensity murmur- I recommended she undergo an echocardiogram to screen for valvular and structural heart disease. ?-     NT-proBNP; Future ?-     Troponin I (High Sensitivity); Future ?-     ECHOCARDIOGRAM COMPLETE; Future ?-     Troponin I (High Sensitivity) ?-     NT-proBNP ?-     Cardiac Stress Test: Informed Consent Details: Physician/Practitioner Attestation; Transcribe to consent form and obtain patient signature; Future ? ?Abnormal electrocardiogram (ECG) (EKG) ?-     MYOCARDIAL PERFUSION IMAGING; Future ?-     ECHOCARDIOGRAM COMPLETE; Future ?-     Cardiac Stress Test: Informed Consent Details: Physician/Practitioner Attestation; Transcribe to consent form and  obtain patient signature; Future ? ?Abnormal electrocardiogram ?-     MYOCARDIAL PERFUSION IMAGING; Future ?-     ECHOCARDIOGRAM COMPLETE; Future ?-     Cardiac Stress Test: Informed Consent Details: Physician/P

## 2021-06-12 NOTE — Patient Instructions (Signed)
Type 2 Diabetes Mellitus, Diagnosis, Adult ?Type 2 diabetes (type 2 diabetes mellitus) is a long-term, or chronic, disease. In type 2 diabetes, one or both of these problems may be present: ?The pancreas does not make enough of a hormone called insulin. ?Cells in the body do not respond properly to the insulin that the body makes (insulin resistance). ?Normally, insulin allows blood sugar (glucose) to enter cells in the body. The cells use glucose for energy. Insulin resistance or lack of insulin causes excess glucose to build up in the blood instead of going into cells. This causes high blood glucose (hyperglycemia).  ?What are the causes? ?The exact cause of type 2 diabetes is not known. ?What increases the risk? ?The following factors may make you more likely to develop this condition: ?Having a family member with type 2 diabetes. ?Being overweight or obese. ?Being inactive (sedentary). ?Having been diagnosed with insulin resistance. ?Having a history of prediabetes, diabetes when you were pregnant (gestational diabetes), or polycystic ovary syndrome (PCOS). ?What are the signs or symptoms? ?In the early stage of this condition, you may not have symptoms. Symptoms develop slowly and may include: ?Increased thirst or hunger. ?Increased urination. ?Unexplained weight loss. ?Tiredness (fatigue) or weakness. ?Vision changes, such as blurry vision. ?Dark patches on the skin. ?How is this diagnosed? ?This condition is diagnosed based on your symptoms, your medical history, a physical exam, and your blood glucose level. Your blood glucose may be checked with one or more of the following blood tests: ?A fasting blood glucose (FBG) test. You will not be allowed to eat (you will fast) for 8 hours or longer before a blood sample is taken. ?A random blood glucose test. This test checks blood glucose at any time of day regardless of when you ate. ?An A1C (hemoglobin A1C) blood test. This test provides information about blood  glucose levels over the previous 2-3 months. ?An oral glucose tolerance test (OGTT). This test measures your blood glucose at two times: ?After fasting. This is your baseline blood glucose level. ?Two hours after drinking a beverage that contains glucose. ?You may be diagnosed with type 2 diabetes if: ?Your fasting blood glucose level is 126 mg/dL (7.0 mmol/L) or higher. ?Your random blood glucose level is 200 mg/dL (11.1 mmol/L) or higher. ?Your A1C level is 6.5% or higher. ?Your oral glucose tolerance test result is higher than 200 mg/dL (11.1 mmol/L). ?These blood tests may be repeated to confirm your diagnosis. ?How is this treated? ?Your treatment may be managed by a specialist called an endocrinologist. Type 2 diabetes may be treated by following instructions from your health care provider about: ?Making dietary and lifestyle changes. These may include: ?Following a personalized nutrition plan that is developed by a registered dietitian. ?Exercising regularly. ?Finding ways to manage stress. ?Checking your blood glucose level as often as told. ?Taking diabetes medicines or insulin daily. This helps to keep your blood glucose levels in the healthy range. ?Taking medicines to help prevent complications from diabetes. Medicines may include: ?Aspirin. ?Medicine to lower cholesterol. ?Medicine to control blood pressure. ?Your health care provider will set treatment goals for you. Your goals will be based on your age, other medical conditions you have, and how you respond to diabetes treatment. Generally, the goal of treatment is to maintain the following blood glucose levels: ?Before meals: 80-130 mg/dL (4.4-7.2 mmol/L). ?After meals: below 180 mg/dL (10 mmol/L). ?A1C level: less than 7%. ?Follow these instructions at home: ?Questions to ask your health care provider ?  Consider asking the following questions: ?Should I meet with a certified diabetes care and education specialist? ?What diabetes medicines do I need,  and when should I take them? ?What equipment will I need to manage my diabetes at home? ?How often do I need to check my blood glucose? ?Where can I find a support group for people with diabetes? ?What number can I call if I have questions? ?When is my next appointment? ?General instructions ?Take over-the-counter and prescription medicines only as told by your health care provider. ?Keep all follow-up visits. This is important. ?Where to find more information ?For help and guidance and for more information about diabetes, please visit: ?American Diabetes Association (ADA): www.diabetes.org ?American Association of Diabetes Care and Education Specialists (ADCES): www.diabeteseducator.org ?International Diabetes Federation (IDF): www.idf.org ?Contact a health care provider if: ?Your blood glucose is at or above 240 mg/dL (13.3 mmol/L) for 2 days in a row. ?You have been sick or have had a fever for 2 days or longer, and you are not getting better. ?You have any of the following problems for more than 6 hours: ?You cannot eat or drink. ?You have nausea and vomiting. ?You have diarrhea. ?Get help right away if: ?You have severe hypoglycemia. This means your blood glucose is lower than 54 mg/dL (3.0 mmol/L). ?You become confused or you have trouble thinking clearly. ?You have difficulty breathing. ?You have moderate or large ketone levels in your urine. ?These symptoms may represent a serious problem that is an emergency. Do not wait to see if the symptoms will go away. Get medical help right away. Call your local emergency services (911 in the [place] Do not drive yourself to the hospital. ?Summary ?Type 2 diabetes mellitus is a long-term, or chronic, disease. In type 2 diabetes, the pancreas does not make enough of a hormone called insulin, or cells in the body do not respond properly to insulin that the body makes. ?This condition is treated by making dietary and lifestyle changes and taking diabetes medicines or  insulin. ?Your health care provider will set treatment goals for you. Your goals will be based on your age, other medical conditions you have, and how you respond to diabetes treatment. ?Keep all follow-up visits. This is important. ?This information is not intended to replace advice given to you by your health care provider. Make sure you discuss any questions you have with your health care provider. ?Document Revised: 06/10/2020 Document Reviewed: 06/10/2020 ?Elsevier Patient Education ? 2022 Elsevier Inc. ? ?

## 2021-06-16 ENCOUNTER — Other Ambulatory Visit: Payer: Self-pay | Admitting: Internal Medicine

## 2021-06-16 DIAGNOSIS — L2084 Intrinsic (allergic) eczema: Secondary | ICD-10-CM

## 2021-06-16 LAB — NT-PROBNP: Pro B Natriuretic peptide (BNP): 94 pg/mL

## 2021-06-16 MED ORDER — FLUOCINONIDE 0.05 % EX CREA: 1.0000 "application " | TOPICAL_CREAM | Freq: Two times a day (BID) | CUTANEOUS | 1 refills | Status: AC

## 2021-06-17 ENCOUNTER — Other Ambulatory Visit: Payer: Self-pay | Admitting: Internal Medicine

## 2021-06-23 ENCOUNTER — Other Ambulatory Visit: Payer: Self-pay

## 2021-06-23 ENCOUNTER — Ambulatory Visit (INDEPENDENT_AMBULATORY_CARE_PROVIDER_SITE_OTHER): Payer: Medicare Other | Admitting: Podiatry

## 2021-06-23 ENCOUNTER — Ambulatory Visit: Payer: Medicare Other

## 2021-06-23 DIAGNOSIS — E118 Type 2 diabetes mellitus with unspecified complications: Secondary | ICD-10-CM

## 2021-06-23 DIAGNOSIS — M21612 Bunion of left foot: Secondary | ICD-10-CM

## 2021-06-23 DIAGNOSIS — M2012 Hallux valgus (acquired), left foot: Secondary | ICD-10-CM

## 2021-06-23 DIAGNOSIS — L84 Corns and callosities: Secondary | ICD-10-CM

## 2021-06-23 NOTE — Patient Instructions (Signed)
Look for urea 40% cream or ointment and apply to the thickened dry skin / calluses. This can be bought over the counter, at a pharmacy or online such as [employer].  

## 2021-06-23 NOTE — Progress Notes (Signed)
?  Subjective:  ?Patient ID: Alexandria [name], female    DOB: 11-22-1940,  MRN: [id number] ? ?Chief Complaint  ?Patient presents with  ? Bunions  ?   Bunion, left foot Referring Provider: Janith Lima  ? ? ?81 y.o. female presents with the above complaint. History confirmed with patient.  She has a bunion on the left foot that feels like it is getting bigger and is causing swelling and discomfort in the foot.  She feels like her shoe and foot is not supported.  She was recently diagnosed with diabetes. ? ?Objective:  ?Physical Exam: ?warm, good capillary refill, no trophic changes or ulcerative lesions, normal DP and PT pulses, normal monofilament exam, normal sensory exam, and she has a large bunion on the left foot with hallux valgus deformity, there is a pinch callus medially here that is tender. ? ?Assessment:  ? ?1. Hallux valgus with bunions of left foot   ?2. Type II diabetes mellitus with manifestations ([place])   ?3. Pre-ulcerative calluses   ? ? ? ?Plan:  ?Patient was evaluated and treated and all questions answered. ? ?Discussed surgical and nonsurgical treatment options for bunion deformity and her foot issues in general.  I recommended urea cream for the callus to soften the skin and she will purchase this.  We discussed wider shoes padding and offloading.  I do not think a corrective bunion splint will do much for her at this point.  We did discuss the option of an external with an extra-depth diabetic shoe with a multidensity insole to offload the area and reduce risk of ulceration or skin breakdown and she will be scheduled to seeing our orthotist for this.  I will see her back as needed.  If worsening initially in surgical correction she would return for x-rays and we will discuss surgery if needed.  Hopefully can avoid at this point since it is bothersome but not life limiting currently. ? ?Return if symptoms worsen or fail to improve.  ? ?

## 2021-06-24 ENCOUNTER — Telehealth: Payer: Self-pay | Admitting: Internal Medicine

## 2021-06-24 NOTE — Telephone Encounter (Signed)
Pt states provider ordered additional testing after having an ekg done ? ?I was unable to locate an order ? ?Please advice  ?

## 2021-06-25 NOTE — Telephone Encounter (Signed)
Pt has been informed that PCP also ordered a cardiac stress test. ? ?I will reach out to the referral team in reagard of an update.  ?

## 2021-06-30 ENCOUNTER — Ambulatory Visit: Payer: Medicare Other

## 2021-06-30 DIAGNOSIS — L84 Corns and callosities: Secondary | ICD-10-CM

## 2021-06-30 DIAGNOSIS — M2012 Hallux valgus (acquired), left foot: Secondary | ICD-10-CM

## 2021-06-30 DIAGNOSIS — E118 Type 2 diabetes mellitus with unspecified complications: Secondary | ICD-10-CM

## 2021-06-30 NOTE — Telephone Encounter (Signed)
Pt checking status of cardiac stress test ? ?Contacted referral team, referrals will start processing order ? ?Pt requesting a cb w/ a status update ?

## 2021-06-30 NOTE — Progress Notes (Signed)
SITUATION ?Reason for Consult: Evaluation for Prefabricated Diabetic Shoes and Custom Diabetic Inserts. ?Patient / Caregiver Report: Patient would like well fitting shoes ? ?OBJECTIVE DATA: ?Patient History / Diagnosis:  ?  ICD-10-CM   ?1. Type II diabetes mellitus with manifestations ([place])  E11.8   ?  ?2. Hallux valgus with bunions of left foot  M20.12   ? M21.612   ?  ?3. Pre-ulcerative calluses  L84   ?  ? ? ?Physician Treating Diabetes:  Janith Lima, MD ? ?Current or Previous Devices:   None and no history ? ?In-Person Foot Examination: ?Ulcers & Callousing:   1st met ?Deformities:    Bunion, hammertoes ?Sensation:    Compromised  ?Shoe Size:     9.5W ? ?ORTHOTIC RECOMMENDATION ?Recommended Devices: ?- 1x pair prefabricated PDAC approved diabetic shoes; Patient Selected Apex A350W Size 9.5W ?- 3x pair custom-to-patient PDAC approved vacuum formed diabetic insoles. ? ?GOALS OF SHOES AND INSOLES ?- Reduce shear and pressure ?- Reduce / Prevent callus formation ?- Reduce / Prevent ulceration ?- Protect the fragile healing compromised diabetic foot. ? ?Patient would benefit from diabetic shoes and inserts as patient has diabetes mellitus and the patient has one or more of the following conditions: ?- History of partial or complete amputation of the foot ?- History of previous foot ulceration. ?- History of pre-ulcerative callus ?- Peripheral neuropathy with evidence of callus formation ?- Foot deformity ?- Poor circulation ? ?ACTIONS PERFORMED ?Potential out of pocket cost was communicated to patient. Patient understood and consented to measurement and casting. Patient was casted for insoles via crush box and measured for shoes via brannock device. Procedure was explained and patient tolerated procedure well. All questions were answered and concerns addressed. Casts were shipped to central fabrication for HOLD until Certificate of Medical Necessity or otherwise necessary authorization from insurance is  obtained. ? ?PLAN ?Shoes are to be ordered and casts released from hold once all appropriate paperwork is complete. Patient is to be contacted and scheduled for fitting once shoes and insoles have been fabricated and received. ? ?

## 2021-07-03 NOTE — Telephone Encounter (Signed)
Pt calling for an update on stress test that was ordered on 06/13/21. Please advise ?

## 2021-07-04 ENCOUNTER — Encounter (HOSPITAL_COMMUNITY): Payer: Self-pay | Admitting: Radiology

## 2021-07-14 ENCOUNTER — Telehealth (HOSPITAL_COMMUNITY): Payer: Self-pay | Admitting: *Deleted

## 2021-07-14 NOTE — Telephone Encounter (Signed)
Patient given detailed instructions per Myocardial Perfusion Study Information Sheet for the test on 07/21/2021 at 8:00. Patient notified to arrive 15 minutes early and that it is imperative to arrive on time for appointment to keep from having the test rescheduled. ? If you need to cancel or reschedule your appointment, please call the office within 24 hours of your appointment. . Patient verbalized understanding.Alexandria [name] S ? ? ?

## 2021-07-21 ENCOUNTER — Ambulatory Visit (HOSPITAL_BASED_OUTPATIENT_CLINIC_OR_DEPARTMENT_OTHER): Payer: Medicare Other

## 2021-07-21 ENCOUNTER — Other Ambulatory Visit: Payer: Self-pay | Admitting: Internal Medicine

## 2021-07-21 ENCOUNTER — Ambulatory Visit (HOSPITAL_COMMUNITY): Payer: Medicare Other | Attending: Cardiology

## 2021-07-21 DIAGNOSIS — R011 Cardiac murmur, unspecified: Secondary | ICD-10-CM | POA: Insufficient documentation

## 2021-07-21 DIAGNOSIS — R9431 Abnormal electrocardiogram [ECG] [EKG]: Secondary | ICD-10-CM

## 2021-07-21 DIAGNOSIS — R9439 Abnormal result of other cardiovascular function study: Secondary | ICD-10-CM | POA: Insufficient documentation

## 2021-07-21 LAB — ECHOCARDIOGRAM COMPLETE
Area-P 1/2: 3.27 cm2
Height: 66 in
S' Lateral: 2.7 cm
Weight: 3056 oz

## 2021-07-21 LAB — MYOCARDIAL PERFUSION IMAGING
LV dias vol: 74 mL (ref 46–106)
LV sys vol: 27 mL
Nuc Stress EF: 64 %
Peak HR: 100 {beats}/min
Rest HR: 58 {beats}/min
Rest Nuclear Isotope Dose: 9.7 mCi
SDS: 2
SRS: 0
SSS: 2
ST Depression (mm): 0 mm
Stress Nuclear Isotope Dose: 32.4 mCi
TID: 1

## 2021-07-21 MED ORDER — TECHNETIUM TC 99M TETROFOSMIN IV KIT
32.4000 | PACK | Freq: Once | INTRAVENOUS | Status: AC | PRN
  Administered 2021-07-21: 32.4 via INTRAVENOUS
  Filled 2021-07-21: qty 33

## 2021-07-21 MED ORDER — TECHNETIUM TC 99M TETROFOSMIN IV KIT
9.7000 | PACK | Freq: Once | INTRAVENOUS | Status: AC | PRN
  Administered 2021-07-21: 9.7 via INTRAVENOUS
  Filled 2021-07-21: qty 10

## 2021-07-21 MED ORDER — REGADENOSON 0.4 MG/5ML IV SOLN
0.4000 mg | Freq: Once | INTRAVENOUS | Status: AC
  Administered 2021-07-21: 0.4 mg via INTRAVENOUS

## 2021-07-22 ENCOUNTER — Other Ambulatory Visit: Payer: Self-pay | Admitting: Internal Medicine

## 2021-07-22 DIAGNOSIS — I10 Essential (primary) hypertension: Secondary | ICD-10-CM

## 2021-07-22 DIAGNOSIS — E785 Hyperlipidemia, unspecified: Secondary | ICD-10-CM

## 2021-07-22 MED ORDER — ATORVASTATIN CALCIUM 20 MG PO TABS: 20.0000 mg | ORAL_TABLET | Freq: Every day | ORAL | 0 refills | Status: DC

## 2021-07-22 MED ORDER — INDAPAMIDE 1.25 MG PO TABS: 1.2500 mg | ORAL_TABLET | Freq: Every day | ORAL | 0 refills | Status: DC

## 2021-07-23 ENCOUNTER — Encounter: Payer: Self-pay | Admitting: Cardiology

## 2021-07-23 DIAGNOSIS — R072 Precordial pain: Secondary | ICD-10-CM | POA: Insufficient documentation

## 2021-07-23 NOTE — Progress Notes (Signed)
?  ?Cardiology Office Note ? ? ?Date:  07/24/2021  ? ?ID:  Alexandria [name], DOB 01/30/1941, MRN [id number] ? ?PCP:  Janith Lima, MD  ?Cardiologist:   Minus Breeding, MD ?Referring:  Janith Lima, MD ? ?Chief Complaint  ?Patient presents with  ? Shortness of Breath  ? ? ?  ?History of Present Illness: ?Alexandria [name] is a 81 y.o. female who presents for evaluation predominantly of shortness of breath.  She had a perfusion study this week with a small defect with a small inferior infarct with peri-infarct ischemia.  She says that she has had chest discomfort off and on for some time and has had negative treadmill test years ago.  However, since Thanksgiving she has had increasing shortness of breath with activities.  This happens with usual activities such as climbing a flight of stairs.  She may or may not get chest discomfort with this.  She can be active such as walking behind a self-propelled lawnmower but she now has to stop to get of breath.  She is not describing PND or orthopnea.  She has maybe a little swelling.  However, she has not had any significant weight gain.  She has not had any cough fevers or chills.  She is not describing palpitations, presyncope or syncope.  She did have an echocardiogram with an EF of 55 to 60%.  There were no significant valvular abnormalities.  Of note she did have some T wave inversions on the EKG done last month.  These are not evident today.  They were not evident previously. ? ?Of note I did get on the phone with her daughter who is a nurse she describes her mom's been having lots of palpitations but her mom does not report this at all.  She says stuff like this used to happen but she not reporting it now. ? ? ?Past Medical History:  ?Diagnosis Date  ? Anemia, iron deficiency   ? Cataracts, bilateral   ? Colon polyp 09/2006  ? adenomatous  ? Diabetes ([place])   ? Fibrocystic breast disease   ? GERD (gastroesophageal reflux disease)   ? Glaucoma, right eye   ? Hyperlipidemia    ? Hypertension   ? LBP (low back pain)   ? Osteoarthritis   ? Osteopenia   ? ? ?Past Surgical History:  ?Procedure Laterality Date  ? ABDOMINAL HYSTERECTOMY    ? BREAST LUMPECTOMY    ? RIGHT 22 years ago  ? CHOLECYSTECTOMY    ? ? ? ?Current Outpatient Medications  ?Medication Sig Dispense Refill  ? Bimatoprost (LUMIGAN OP) Apply to eye.    ? cyclobenzaprine (FLEXERIL) 5 MG tablet Take 1 tablet (5 mg total) by mouth 3 (three) times daily as needed for muscle spasms. 270 tablet 0  ? eszopiclone (LUNESTA) 2 MG TABS tablet Take 1 tablet (2 mg total) by mouth at bedtime as needed for sleep. Take immediately before bedtime 90 tablet 1  ? fluocinonide cream (LIDEX) 2.59 % Apply 1 application. topically 2 (two) times daily. 60 g 1  ? indapamide (LOZOL) 1.25 MG tablet Take 1 tablet (1.25 mg total) by mouth daily. 90 tablet 0  ? Iron-FA-B Cmp-C-Biot-Probiotic (FUSION PLUS) CAPS TAKE 1 CAPSULE BY MOUTH EVERY DAY 14 capsule 5  ? lidocaine (LIDODERM) 5 % Place 1 patch onto the skin daily. Remove & Discard patch within 12 hours or as directed by MD 90 patch 1  ? losartan (COZAAR) 100 MG tablet Take 1 tablet (  100 mg total) by mouth daily. 90 tablet 0  ? metoprolol tartrate (LOPRESSOR) 25 MG tablet TAKE 1 TABLET 2 HR PRIOR TO CARDIAC PROCEDURE 1 tablet 0  ? nabumetone (RELAFEN) 500 MG tablet Take 1 tablet (500 mg total) by mouth 2 (two) times daily as needed. 180 tablet 0  ? potassium chloride SA (KLOR-CON M) 20 MEQ tablet Take 1 tablet (20 mEq total) by mouth 2 (two) times daily. 180 tablet 0  ? rosuvastatin (CRESTOR) 20 MG tablet Take 1 tablet (20 mg total) by mouth daily. 90 tablet 3  ? Turmeric 500 MG CAPS Take 1 capsule by mouth daily. 90 capsule 1  ? ?No current facility-administered medications for this visit.  ? ? ?Allergies:   Oxycodone-acetaminophen and Tramadol hcl  ? ? ?Social History:  The patient  reports that she has never smoked. She has never used smokeless tobacco. She reports that she does not drink alcohol and  does not use drugs.  ? ?Family History:  The patient's family history includes Arthritis in an other family member; Hypertension in an other family member.   Mother lived until 71 ? ? ?ROS:  Please see the history of present illness.   Otherwise, review of systems are positive for none.   All other systems are reviewed and negative.  ? ? ?PHYSICAL EXAM: ?VS:  BP 140/80 (BP Location: Left Arm)   Pulse 61   Ht '5\' 6"'$  (1.676 m)   Wt 181 lb 6.4 oz (82.3 kg)   SpO2 98%   BMI 29.28 kg/m?  , BMI Body mass index is 29.28 kg/m?. ?GENERAL:  Well appearing ?HEENT:  Pupils equal round and reactive, fundi not visualized, oral mucosa unremarkable ?NECK:  No jugular venous distention, waveform within normal limits, carotid upstroke brisk and symmetric, no bruits, no thyromegaly ?LYMPHATICS:  No cervical, inguinal adenopathy ?LUNGS:  Clear to auscultation bilaterally ?BACK:  No CVA tenderness ?CHEST:  Unremarkable ?HEART:  PMI not displaced or sustained,S1 and S2 within normal limits, no S3, no S4, no clicks, no rubs, no murmurs ?ABD:  Flat, positive bowel sounds normal in frequency in pitch, no bruits, no rebound, no guarding, no midline pulsatile mass, no hepatomegaly, no splenomegaly ?EXT:  2 plus pulses throughout, no edema, no cyanosis no clubbing ?SKIN:  No rashes no nodules ?NEURO:  Cranial nerves II through XII grossly intact, motor grossly intact throughout ?PSYCH:  Cognitively intact, oriented to person place and time ? ? ? ?EKG:  EKG is ordered today. ?The ekg ordered today demonstrates sinus rhythm, rate 61, axis within normal limits, early transition lead V2, no acute ST-T wave changes, previous anterior T wave inversions are no longer present. ? ? ?Recent Labs: ?11/13/2020: ALT 13; TSH 1.45 ?06/12/2021: BUN 26; Creatinine, Ser 1.01; Hemoglobin 11.3; Platelets 264.0; Potassium 3.7; Pro B Natriuretic peptide (BNP) 94; Sodium 142  ? ? ?Lipid Panel ?   ?Component Value Date/Time  ? CHOL 241 (H) 11/13/2020 1027  ? TRIG  108.0 11/13/2020 1027  ? HDL 69.10 11/13/2020 1027  ? CHOLHDL 3 11/13/2020 1027  ? VLDL 21.6 11/13/2020 1027  ? LDLCALC 151 (H) 11/13/2020 1027  ? ?  ? ?Wt Readings from Last 3 Encounters:  ?07/24/21 181 lb 6.4 oz (82.3 kg)  ?07/21/21 191 lb (86.6 kg)  ?06/12/21 191 lb (86.6 kg)  ?  ? ? ?Other studies Reviewed: ?Additional studies/ records that were reviewed today include: SPECT. ?Review of the above records demonstrates:  Please see elsewhere in the note.   ? ? ?  ASSESSMENT AND PLAN: ? ?Shortness of breath: The patient did have T wave inversions in her anterior EKG which were compared to 2010 and not noted to be present.  She does have cardiovascular risk factors with recent diagnosis of diabetes.  She had a mildly abnormal stress test.  Given all of this and the symptoms I think coronary CTA is indicated. ? ?Dyslipidemia: I do note that her lipid profile included an LDL of 151.  I am going to stop her Lipitor and start Crestor 20 mg daily.  She should get a repeat lipid profile in about 10 weeks. ? ?Diabetes mellitus: This is a new diagnosis.  I will defer management to her primary provider.   ? ?Hypertension: The patient's blood pressure is at target.  She will continue the meds as listed. ? ? ?Current medicines are reviewed at length with the patient today.  The patient does not have concerns regarding medicines. ? ?The following changes have been made:  no change ? ?Labs/ tests ordered today include:  ? ?Orders Placed This Encounter  ?Procedures  ? CT CORONARY MORPH W/CTA COR W/SCORE W/CA W/CM &/OR WO/CM  ? Basic metabolic panel  ? Lipid panel  ? EKG 12-Lead  ? ? ? ?Disposition:   FU with me as needed based on the results of the above ? ? ?Signed, ?Minus Breeding, MD  ?07/24/2021 1:47 PM    ?[place] ? ? ? ?

## 2021-07-24 ENCOUNTER — Ambulatory Visit (INDEPENDENT_AMBULATORY_CARE_PROVIDER_SITE_OTHER): Payer: Medicare Other | Admitting: Cardiology

## 2021-07-24 ENCOUNTER — Encounter: Payer: Self-pay | Admitting: Cardiology

## 2021-07-24 VITALS — BP 140/80 | HR 61 | Ht 66.0 in | Wt 181.4 lb

## 2021-07-24 DIAGNOSIS — Z79899 Other long term (current) drug therapy: Secondary | ICD-10-CM | POA: Diagnosis not present

## 2021-07-24 DIAGNOSIS — R0602 Shortness of breath: Secondary | ICD-10-CM

## 2021-07-24 DIAGNOSIS — E1165 Type 2 diabetes mellitus with hyperglycemia: Secondary | ICD-10-CM

## 2021-07-24 DIAGNOSIS — I10 Essential (primary) hypertension: Secondary | ICD-10-CM

## 2021-07-24 DIAGNOSIS — Z01812 Encounter for preprocedural laboratory examination: Secondary | ICD-10-CM

## 2021-07-24 DIAGNOSIS — R072 Precordial pain: Secondary | ICD-10-CM

## 2021-07-24 DIAGNOSIS — R9439 Abnormal result of other cardiovascular function study: Secondary | ICD-10-CM

## 2021-07-24 DIAGNOSIS — E785 Hyperlipidemia, unspecified: Secondary | ICD-10-CM | POA: Diagnosis not present

## 2021-07-24 MED ORDER — ROSUVASTATIN CALCIUM 20 MG PO TABS: 20.0000 mg | ORAL_TABLET | Freq: Every day | ORAL | 3 refills | Status: DC

## 2021-07-24 MED ORDER — METOPROLOL TARTRATE 25 MG PO TABS: ORAL_TABLET | ORAL | 0 refills | Status: DC

## 2021-07-24 NOTE — Addendum Note (Signed)
Addended by: Meryl Crutch on: 07/24/2021 01:53 PM ? ? Modules accepted: Orders ? ?

## 2021-07-24 NOTE — Patient Instructions (Addendum)
Medication Instructions:  ?STOP: Atorvastatin 20 mg daily ?START: Rosuvastatin 20 mg daily ? ?*If you need a refill on your cardiac medications before your next appointment, please call your pharmacy* ? ? ?Lab Work: ?Your physician recommends lab work today (BMP), then Fasting LIpid in 10 weeks ? ?If you have labs (blood work) drawn today and your tests are completely normal, you will receive your results only by: ?MyChart Message (if you have MyChart) OR ?A paper copy in the mail ?If you have any lab test that is abnormal or we need to change your treatment, we will call you to review the results. ? ? ?Testing/Procedures: ?Cardiac CT Angiography (CTA), is a special type of CT scan that uses a computer to produce multi-dimensional views of major blood vessels throughout the body. In CT angiography, a contrast material is injected through an IV to help visualize the blood vessels ?[facility]  ? ? ?Follow-Up: ?At [facility], you and your health needs are our priority.  As part of our continuing mission to provide you with exceptional heart care, we have created designated Provider Care Teams.  These Care Teams include your primary Cardiologist (physician) and Advanced Practice Providers (APPs -  Physician Assistants and Nurse Practitioners) who all work together to provide you with the care you need, when you need it. ? ?We recommend signing up for the patient portal called "MyChart".  Sign up information is provided on this After Visit Summary.  MyChart is used to connect with patients for Virtual Visits (Telemedicine).  Patients are able to view lab/test results, encounter notes, upcoming appointments, etc.  Non-urgent messages can be sent to your provider as well.   ?To learn more about what you can do with MyChart, go to [email or web address].   ? ?Your next appointment:   ?As needed ? ?The format for your next appointment:   ?In Person ? ?Provider:   ?Minus Breeding, MD { ? ? ? ?Your cardiac CT  will be scheduled at one of the below locations:  ? ?[facility] ?[address] ?[place], [place] [address] ?[phone] ? ? ?If scheduled at [facility], please arrive at the [facility] Entrance (Entrance C2) of [facility] 30 minutes prior to test start time. ?You can use the FREE valet parking offered at entrance C (encouraged to control the heart rate for the test)  ?Proceed to the [facility] Radiology Department (first floor) to check-in and test prep. ? ?All radiology patients and guests should use entrance C2 at [facility], accessed from [facility], even though the hospital's physical address listed is [address] ? ? ? ?If scheduled at [facility], please arrive 15 mins early for check-in and test prep. ? ?Please follow these instructions carefully (unless otherwise directed): ? ?On the Night Before the Test: ?Be sure to Drink plenty of water. ?Do not consume any caffeinated/decaffeinated beverages or chocolate 12 hours prior to your test. ?Do not take any antihistamines 12 hours prior to your test. ? ?On the Day of the Test: ?Drink plenty of water until 1 hour prior to the test. ?Do not eat any food 4 hours prior to the test. ?You may take your regular medications prior to the test.  ?Take metoprolol (Lopressor) 25 mg two hours prior to test. ?FEMALES- please wear underwire-free bra if available, avoid dresses & tight clothing  ? ?     ?After the Test: ?Drink plenty of water. ?  After receiving IV contrast, you may experience a mild flushed feeling. This is normal. ?On occasion, you may experience a mild rash up to 24 hours after the test. This is not dangerous. If this occurs, you can take Benadryl 25 mg and increase your fluid intake. ?If you experience trouble breathing, this can be serious. If it is severe call 911 IMMEDIATELY. If it is mild, please call our office. ?If you take any of these  medications: Glipizide/Metformin, Avandament, Glucavance, please do not take 48 hours after completing test unless otherwise instructed. ? ?We will call to schedule your test 2-4 weeks out understanding that some insurance companies will need an authorization prior to the service being performed.  ? ?For non-scheduling related questions, please contact the cardiac imaging nurse navigator should you have any questions/concerns: ?Marchia Bond, Cardiac Imaging Nurse Navigator ?Gordy Clement, Cardiac Imaging Nurse Navigator ?[facility] ?Direct Office Dial: [phone]  ? ?For scheduling needs, including cancellations and rescheduling, please call [place], [phone]. ? ? ?Important Information About Sugar ? ? ? ? ? ? ?

## 2021-07-25 LAB — BASIC METABOLIC PANEL
BUN/Creatinine Ratio: 13 (ref 12–28)
BUN: 12 mg/dL (ref 8–27)
CO2: 27 mmol/L (ref 20–29)
Calcium: 9.2 mg/dL (ref 8.7–10.3)
Chloride: 100 mmol/L (ref 96–106)
Creatinine, Ser: 0.91 mg/dL (ref 0.57–1.00)
Glucose: 98 mg/dL (ref 70–99)
Potassium: 4 mmol/L (ref 3.5–5.2)
Sodium: 140 mmol/L (ref 134–144)
eGFR: 64 mL/min/{1.73_m2} (ref >59)

## 2021-07-28 ENCOUNTER — Encounter: Payer: Self-pay | Admitting: *Deleted

## 2021-08-06 ENCOUNTER — Other Ambulatory Visit: Payer: Self-pay | Admitting: Internal Medicine

## 2021-08-06 ENCOUNTER — Telehealth: Payer: Self-pay | Admitting: Physician Assistant

## 2021-08-06 ENCOUNTER — Telehealth (HOSPITAL_COMMUNITY): Payer: Self-pay | Admitting: Emergency Medicine

## 2021-08-06 MED ORDER — METOPROLOL TARTRATE 25 MG PO TABS: ORAL_TABLET | ORAL | 0 refills | Status: DC

## 2021-08-06 NOTE — Telephone Encounter (Signed)
Reaching out to patient to offer assistance regarding upcoming cardiac imaging study; pt verbalizes understanding of appt date/time, parking situation and where to check in, pre-test NPO status and medications ordered, and verified current allergies; name and call back number provided for further questions should they arise ?Marchia Bond RN Navigator Cardiac Imaging ?[facility] ?[phone] office ?[phone] cell ? ?'25mg'$  metoprolol tartrate ?Denies iv issues ?Arrival 730 ? ?

## 2021-08-06 NOTE — Telephone Encounter (Signed)
? ?  The patient called the answering service after-hours today. ?She accidentally threw away her metoprolol that she is supposed to take prior to coronary CTA tomorrow. She is requesting this be sent in again to her pharmacy. I see where Dr. Percival Spanish had prescribed metoprolol tartrate '25mg'$  take 1 tablet 2 hr prior to cardiac procedure so will re-send in but did advise patient she might be required to pay differently since this is same rx. Pharmacy is open til 9 and she is heading up there now. ?The patient was very apologetic and verbalized understanding and gratitude. ? ?Charlie Pitter, PA-C ? ?

## 2021-08-07 ENCOUNTER — Encounter (HOSPITAL_COMMUNITY): Payer: Self-pay

## 2021-08-07 ENCOUNTER — Ambulatory Visit (HOSPITAL_COMMUNITY)
Admission: RE | Admit: 2021-08-07 | Discharge: 2021-08-07 | Disposition: A | Discharge status: Home / Self Care | Payer: Medicare Other | Source: Ambulatory Visit | Attending: Cardiology | Admitting: Cardiology

## 2021-08-07 DIAGNOSIS — R072 Precordial pain: Secondary | ICD-10-CM | POA: Diagnosis not present

## 2021-08-07 DIAGNOSIS — R9439 Abnormal result of other cardiovascular function study: Secondary | ICD-10-CM | POA: Diagnosis not present

## 2021-08-07 MED ORDER — IOHEXOL 350 MG/ML SOLN
100.0000 mL | Freq: Once | INTRAVENOUS | Status: AC | PRN
  Administered 2021-08-07: 100 mL via INTRAVENOUS

## 2021-08-07 MED ORDER — NITROGLYCERIN 0.4 MG SL SUBL
0.8000 mg | SUBLINGUAL_TABLET | Freq: Once | SUBLINGUAL | Status: AC
Start: 2021-08-07 — End: 2021-08-07
  Administered 2021-08-07: 0.8 mg via SUBLINGUAL

## 2021-08-07 MED ORDER — NITROGLYCERIN 0.4 MG SL SUBL
SUBLINGUAL_TABLET | SUBLINGUAL | Status: AC
Start: 2021-08-07 — End: 2021-08-07
  Filled 2021-08-07: qty 2

## 2021-09-15 ENCOUNTER — Encounter: Payer: Self-pay | Admitting: Internal Medicine

## 2021-09-15 ENCOUNTER — Ambulatory Visit (INDEPENDENT_AMBULATORY_CARE_PROVIDER_SITE_OTHER): Payer: Medicare Other | Admitting: Internal Medicine

## 2021-09-15 VITALS — BP 132/66 | HR 66 | Temp 98.7°F | Resp 16 | Ht 66.0 in | Wt 175.0 lb

## 2021-09-15 DIAGNOSIS — M150 Primary generalized (osteo)arthritis: Secondary | ICD-10-CM

## 2021-09-15 DIAGNOSIS — E785 Hyperlipidemia, unspecified: Secondary | ICD-10-CM | POA: Diagnosis not present

## 2021-09-15 DIAGNOSIS — M5136 Other intervertebral disc degeneration, lumbar region: Secondary | ICD-10-CM

## 2021-09-15 DIAGNOSIS — M159 Polyosteoarthritis, unspecified: Secondary | ICD-10-CM | POA: Diagnosis not present

## 2021-09-15 DIAGNOSIS — I10 Essential (primary) hypertension: Secondary | ICD-10-CM

## 2021-09-15 DIAGNOSIS — N1831 Chronic kidney disease, stage 3a: Secondary | ICD-10-CM

## 2021-09-15 DIAGNOSIS — R1319 Other dysphagia: Secondary | ICD-10-CM | POA: Insufficient documentation

## 2021-09-15 DIAGNOSIS — M545 Low back pain, unspecified: Secondary | ICD-10-CM

## 2021-09-15 DIAGNOSIS — M4710 Other spondylosis with myelopathy, site unspecified: Secondary | ICD-10-CM

## 2021-09-15 DIAGNOSIS — E876 Hypokalemia: Secondary | ICD-10-CM

## 2021-09-15 DIAGNOSIS — G8929 Other chronic pain: Secondary | ICD-10-CM | POA: Diagnosis not present

## 2021-09-15 DIAGNOSIS — D508 Other iron deficiency anemias: Secondary | ICD-10-CM | POA: Diagnosis not present

## 2021-09-15 DIAGNOSIS — E118 Type 2 diabetes mellitus with unspecified complications: Secondary | ICD-10-CM

## 2021-09-15 DIAGNOSIS — G4701 Insomnia due to medical condition: Secondary | ICD-10-CM

## 2021-09-15 DIAGNOSIS — M51369 Other intervertebral disc degeneration, lumbar region without mention of lumbar back pain or lower extremity pain: Secondary | ICD-10-CM

## 2021-09-15 LAB — MICROALBUMIN / CREATININE URINE RATIO
Creatinine,U: 67.6 mg/dL
Microalb Creat Ratio: 1 mg/g (ref 0.0–30.0)
Microalb, Ur: <0.7 mg/dL (ref 0.0–1.9)

## 2021-09-15 LAB — BASIC METABOLIC PANEL
BUN: 22 mg/dL (ref 6–23)
CO2: 28 mEq/L (ref 19–32)
Calcium: 9.4 mg/dL (ref 8.4–10.5)
Chloride: 103 mEq/L (ref 96–112)
Creatinine, Ser: 0.96 mg/dL (ref 0.40–1.20)
GFR: 56.34 mL/min — ABNORMAL LOW (ref >60.00)
Glucose, Bld: 97 mg/dL (ref 70–99)
Potassium: 3.8 mEq/L (ref 3.5–5.1)
Sodium: 139 mEq/L (ref 135–145)

## 2021-09-15 LAB — CBC WITH DIFFERENTIAL/PLATELET
Basophils Absolute: 0 10*3/uL (ref 0.0–0.1)
Basophils Relative: 0.7 % (ref 0.0–3.0)
Eosinophils Absolute: 0.1 10*3/uL (ref 0.0–0.7)
Eosinophils Relative: 3.6 % (ref 0.0–5.0)
HCT: 34.1 % — ABNORMAL LOW (ref 36.0–46.0)
Hemoglobin: 11.3 g/dL — ABNORMAL LOW (ref 12.0–15.0)
Lymphocytes Relative: 34.3 % (ref 12.0–46.0)
Lymphs Abs: 1.3 10*3/uL (ref 0.7–4.0)
MCHC: 33.1 g/dL (ref 30.0–36.0)
MCV: 87.9 fl (ref 78.0–100.0)
Monocytes Absolute: 0.3 10*3/uL (ref 0.1–1.0)
Monocytes Relative: 7.6 % (ref 3.0–12.0)
Neutro Abs: 2.1 10*3/uL (ref 1.4–7.7)
Neutrophils Relative %: 53.8 % (ref 43.0–77.0)
Platelets: 231 10*3/uL (ref 150.0–400.0)
RBC: 3.88 Mil/uL (ref 3.87–5.11)
RDW: 14.9 % (ref 11.5–15.5)
WBC: 3.9 10*3/uL — ABNORMAL LOW (ref 4.0–10.5)

## 2021-09-15 LAB — URINALYSIS, ROUTINE W REFLEX MICROSCOPIC
Bilirubin Urine: NEGATIVE
Hgb urine dipstick: NEGATIVE
Ketones, ur: NEGATIVE
Leukocytes,Ua: NEGATIVE
Nitrite: NEGATIVE
RBC / HPF: NONE SEEN (ref 0–?)
Specific Gravity, Urine: 1.01 (ref 1.000–1.030)
Total Protein, Urine: NEGATIVE
Urine Glucose: NEGATIVE
Urobilinogen, UA: 0.2 (ref 0.0–1.0)
pH: 6.5 (ref 5.0–8.0)

## 2021-09-15 LAB — LIPID PANEL
Cholesterol: 145 mg/dL (ref 0–200)
HDL: 63 mg/dL (ref >39.00)
LDL Cholesterol: 67 mg/dL (ref 0–99)
NonHDL: 82
Total CHOL/HDL Ratio: 2
Triglycerides: 76 mg/dL (ref 0.0–149.0)
VLDL: 15.2 mg/dL (ref 0.0–40.0)

## 2021-09-15 LAB — IBC + FERRITIN
Ferritin: 475.3 ng/mL — ABNORMAL HIGH (ref 10.0–291.0)
Iron: 66 ug/dL (ref 42–145)
Saturation Ratios: 21.5 % (ref 20.0–50.0)
TIBC: 306.6 ug/dL (ref 250.0–450.0)
Transferrin: 219 mg/dL (ref 212.0–360.0)

## 2021-09-15 LAB — HEMOGLOBIN A1C: Hgb A1c MFr Bld: 6.4 % (ref 4.6–6.5)

## 2021-09-15 MED ORDER — POTASSIUM CHLORIDE CRYS ER 20 MEQ PO TBCR: 20.0000 meq | EXTENDED_RELEASE_TABLET | Freq: Two times a day (BID) | ORAL | 0 refills | Status: DC

## 2021-09-15 MED ORDER — NABUMETONE 500 MG PO TABS: 500.0000 mg | ORAL_TABLET | Freq: Two times a day (BID) | ORAL | 0 refills | Status: DC | PRN

## 2021-09-15 MED ORDER — LOSARTAN POTASSIUM 100 MG PO TABS: 100.0000 mg | ORAL_TABLET | Freq: Every day | ORAL | 0 refills | Status: DC

## 2021-09-15 MED ORDER — CYCLOBENZAPRINE HCL 5 MG PO TABS: 5.0000 mg | ORAL_TABLET | Freq: Three times a day (TID) | ORAL | 0 refills | Status: DC | PRN

## 2021-09-15 MED ORDER — FUSION PLUS PO CAPS: 1.0000 | ORAL_CAPSULE | Freq: Every day | ORAL | 2 refills | Status: DC

## 2021-09-15 MED ORDER — ESZOPICLONE 2 MG PO TABS: 2.0000 mg | ORAL_TABLET | Freq: Every evening | ORAL | 1 refills | Status: DC | PRN

## 2021-09-15 MED ORDER — INDAPAMIDE 1.25 MG PO TABS: 1.2500 mg | ORAL_TABLET | Freq: Every day | ORAL | 0 refills | Status: DC

## 2021-09-15 NOTE — Patient Instructions (Signed)

## 2021-09-15 NOTE — Progress Notes (Signed)
Subjective:  Patient ID: Alexandria [name], female    DOB: July 16, 1940  Age: 81 y.o. MRN: [id number]  CC: Anemia, Osteoarthritis, Back Pain, Hypertension, Hyperlipidemia, and Diabetes   HPI Alexandria [name] presents for f/up -  She continues to complain of a squeezing sensation in her esophagus that causes chest pain.  She also has some trouble swallowing.  She denies shortness of breath, cough, dyspnea on exertion, abdominal pain, or edema.  Outpatient Medications Prior to Visit  Medication Sig Dispense Refill   Bimatoprost (LUMIGAN OP) Apply to eye.     fluocinonide cream (LIDEX) 8.67 % Apply 1 application. topically 2 (two) times daily. 60 g 1   lidocaine (LIDODERM) 5 % Place 1 patch onto the skin daily. Remove & Discard patch within 12 hours or as directed by MD 90 patch 1   metoprolol tartrate (LOPRESSOR) 25 MG tablet TAKE 1 TABLET 2 HR PRIOR TO CARDIAC PROCEDURE 1 tablet 0   rosuvastatin (CRESTOR) 20 MG tablet Take 1 tablet (20 mg total) by mouth daily. 90 tablet 3   Turmeric 500 MG CAPS Take 1 capsule by mouth daily. 90 capsule 1   cyclobenzaprine (FLEXERIL) 5 MG tablet Take 1 tablet (5 mg total) by mouth 3 (three) times daily as needed for muscle spasms. 270 tablet 0   eszopiclone (LUNESTA) 2 MG TABS tablet Take 1 tablet (2 mg total) by mouth at bedtime as needed for sleep. Take immediately before bedtime 90 tablet 1   indapamide (LOZOL) 1.25 MG tablet Take 1 tablet (1.25 mg total) by mouth daily. 90 tablet 0   Iron-FA-B Cmp-C-Biot-Probiotic (FUSION PLUS) CAPS TAKE 1 CAPSULE BY MOUTH EVERY DAY 30 capsule 2   losartan (COZAAR) 100 MG tablet Take 1 tablet (100 mg total) by mouth daily. 90 tablet 0   nabumetone (RELAFEN) 500 MG tablet Take 1 tablet (500 mg total) by mouth 2 (two) times daily as needed. 180 tablet 0   potassium chloride SA (KLOR-CON M) 20 MEQ tablet Take 1 tablet (20 mEq total) by mouth 2 (two) times daily. 180 tablet 0   No facility-administered medications prior to visit.     ROS Review of Systems  Constitutional:  Negative for chills, diaphoresis, fatigue and fever.  HENT:  Positive for trouble swallowing. Negative for sinus pressure and voice change.   Eyes: Negative.   Respiratory:  Negative for cough, chest tightness and shortness of breath.   Cardiovascular:  Positive for chest pain. Negative for palpitations and leg swelling.  Gastrointestinal:  Negative for abdominal pain, constipation, diarrhea, nausea and vomiting.  Endocrine: Negative.   Genitourinary: Negative.  Negative for difficulty urinating.  Musculoskeletal:  Positive for arthralgias. Negative for myalgias.  Skin: Negative.   Neurological:  Negative for dizziness, weakness, light-headedness and headaches.  Hematological:  Negative for adenopathy. Does not bruise/bleed easily.  Psychiatric/Behavioral:  Positive for sleep disturbance. The patient is not nervous/anxious.     Objective:  BP 132/66 (BP Location: Left Arm, Patient Position: Sitting, Cuff Size: Large)   Pulse 66   Temp 98.7 F (37.1 C) (Oral)   Resp 16   Ht '5\' 6"'$  (1.676 m)   Wt 175 lb (79.4 kg)   SpO2 95%   BMI 28.25 kg/m   BP Readings from Last 3 Encounters:  09/15/21 132/66  08/07/21 117/69  07/24/21 140/80    Wt Readings from Last 3 Encounters:  09/15/21 175 lb (79.4 kg)  07/24/21 181 lb 6.4 oz (82.3 kg)  07/21/21 191 lb (86.6  kg)    Physical Exam Vitals reviewed.  HENT:     Nose: Nose normal.     Mouth/Throat:     Mouth: Mucous membranes are moist.  Eyes:     General: No scleral icterus.    Conjunctiva/sclera: Conjunctivae normal.  Cardiovascular:     Rate and Rhythm: Normal rate and regular rhythm.     Heart sounds: No murmur heard. Pulmonary:     Effort: Pulmonary effort is normal.     Breath sounds: No stridor. No wheezing, rhonchi or rales.  Abdominal:     General: Abdomen is flat.     Palpations: There is no mass.     Tenderness: There is no abdominal tenderness. There is no guarding or  rebound.     Hernia: No hernia is present.  Musculoskeletal:        General: Normal range of motion.     Cervical back: Neck supple.     Right lower leg: No edema.     Left lower leg: No edema.  Lymphadenopathy:     Cervical: No cervical adenopathy.  Skin:    General: Skin is warm and dry.     Coloration: Skin is not pale.  Neurological:     General: No focal deficit present.     Mental Status: She is alert. Mental status is at baseline.  Psychiatric:        Mood and Affect: Mood normal.        Behavior: Behavior normal.     Lab Results  Component Value Date   WBC 3.9 (L) 09/15/2021   HGB 11.3 (L) 09/15/2021   HCT 34.1 (L) 09/15/2021   PLT 231.0 09/15/2021   GLUCOSE 97 09/15/2021   CHOL 145 09/15/2021   TRIG 76.0 09/15/2021   HDL 63.00 09/15/2021   LDLCALC 67 09/15/2021   ALT 13 11/13/2020   AST 13 11/13/2020   NA 139 09/15/2021   K 3.8 09/15/2021   CL 103 09/15/2021   CREATININE 0.96 09/15/2021   BUN 22 09/15/2021   CO2 28 09/15/2021   TSH 1.45 11/13/2020   HGBA1C 6.4 09/15/2021   MICROALBUR <0.7 09/15/2021    CT CORONARY MORPH W/CTA COR W/SCORE W/CA W/CM &/OR WO/CM  Addendum Date: 08/07/2021   ADDENDUM REPORT: 08/07/2021 09:53 CLINICAL DATA:  Chest pain EXAM: Cardiac/Coronary CTA TECHNIQUE: A non-contrast, gated CT scan was obtained with axial slices of 3 mm through the heart for calcium scoring. Calcium scoring was performed using the Agatston method. A 120 kV prospective, gated, contrast cardiac scan was obtained. Gantry rotation speed was 250 msecs and collimation was 0.6 mm. Two sublingual nitroglycerin tablets (0.8 mg) were given. The 3D data set was reconstructed in 5% intervals of the 35-75% of the R-R cycle. Diastolic phases were analyzed on a dedicated workstation using MPR, MIP, and VRT modes. The patient received 95 cc of contrast. FINDINGS: Image quality: Excellent. Noise artifact is: Limited. Coronary Arteries:  Normal coronary origin.  Right dominance.  Left main: The left main is a large caliber vessel with a normal take off from the left coronary cusp that bifurcates to form a left anterior descending artery and a left circumflex artery. There is no plaque or stenosis. Left anterior descending artery: The LAD is patent without evidence of plaque or stenosis. The LAD gives off 3 patent diagonal branches. Left circumflex artery: The LCX is non-dominant and patent with no evidence of plaque or stenosis. The LCX gives off 2 patent obtuse marginal branches.  Right coronary artery: The RCA is dominant with normal take off from the right coronary cusp. The RCA terminates as a PDA and right posterolateral branch. There is mild calcified plaque in the proximal, mid and distal RCA with associated stenosis of 25-49%. Right Atrium: Right atrial size is within normal limits. Right Ventricle: The right ventricular cavity is within normal limits. Left Atrium: Left atrial size is normal in size with no left atrial appendage filling defect. Left Ventricle: The ventricular cavity size is within normal limits. There are no stigmata of prior infarction. There is no abnormal filling defect. Pulmonary arteries: Normal in size without proximal filling defect. Pulmonary veins: Normal pulmonary venous drainage. Pericardium: Normal thickness with no significant effusion or calcium present. Cardiac valves: The aortic valve is trileaflet without significant calcification. The mitral valve is normal structure without significant calcification. Aorta: Normal caliber with no significant disease. Extra-cardiac findings: See attached radiology report for non-cardiac structures. IMPRESSION: 1. Coronary calcium score of 15.6. This was 39th percentile for age-, sex, and race-matched controls. 2.  Normal coronary origin with right dominance. 3.  Mild atherosclerosis.  CAD RADS 2. 4.  Recommend preventive therapy and risk factor modification. 5.  Consider non atherosclerotic causes of chest pain.  RECOMMENDATIONS: 1. CAD-RADS 0: No evidence of CAD (0%). Consider non-atherosclerotic causes of chest pain. 2. CAD-RADS 1: Minimal non-obstructive CAD (0-24%). Consider non-atherosclerotic causes of chest pain. Consider preventive therapy and risk factor modification. 3. CAD-RADS 2: Mild non-obstructive CAD (25-49%). Consider non-atherosclerotic causes of chest pain. Consider preventive therapy and risk factor modification. 4. CAD-RADS 3: Moderate stenosis. Consider symptom-guided anti-ischemic pharmacotherapy as well as risk factor modification per guideline directed care. Additional analysis with CT FFR will be submitted. 5. CAD-RADS 4: Severe stenosis. (70-99% or > 50% left main). Cardiac catheterization or CT FFR is recommended. Consider symptom-guided anti-ischemic pharmacotherapy as well as risk factor modification per guideline directed care. Invasive coronary angiography recommended with revascularization per published guideline statements. 6. CAD-RADS 5: Total coronary occlusion (100%). Consider cardiac catheterization or viability assessment. Consider symptom-guided anti-ischemic pharmacotherapy as well as risk factor modification per guideline directed care. 7. CAD-RADS N: Non-diagnostic study. Obstructive CAD can't be excluded. Alternative evaluation is recommended. Fransico Him, MD Electronically Signed   By: Fransico Him M.D.   On: 08/07/2021 09:53   Result Date: 08/07/2021 EXAM: OVER-READ INTERPRETATION  CT CHEST The following report is an over-read performed by radiologist Dr. Vinnie Langton of [facility], [place] on 08/07/2021. This over-read does not include interpretation of cardiac or coronary anatomy or pathology. The coronary calcium score/coronary CTA interpretation by the cardiologist is attached. COMPARISON:  No priors. FINDINGS: Within the visualized portions of the thorax there are no suspicious appearing pulmonary nodules or masses, there is no acute consolidative airspace  disease, no pleural effusions, no pneumothorax and no lymphadenopathy. Visualized portions of the upper abdomen are unremarkable. There are no aggressive appearing lytic or blastic lesions noted in the visualized portions of the skeleton. IMPRESSION: 1. No significant incidental noncardiac findings are noted. Electronically Signed: By: Vinnie Langton M.D. On: 08/07/2021 08:16    Assessment & Plan:   Alexandria [name] was seen today for anemia, osteoarthritis, back pain, hypertension, hyperlipidemia and diabetes.  Diagnoses and all orders for this visit:  Stage 3a chronic kidney disease ([place])- Her renal function is stable. -     Microalbumin / creatinine urine ratio; Future -     Urinalysis, Routine w reflex microscopic; Future -     Urinalysis, Routine w  reflex microscopic -     Microalbumin / creatinine urine ratio  Chronic bilateral low back pain without sciatica -     Discontinue: cyclobenzaprine (FLEXERIL) 5 MG tablet; Take 1 tablet (5 mg total) by mouth 3 (three) times daily as needed for muscle spasms. -     cyclobenzaprine (FLEXERIL) 5 MG tablet; Take 1 tablet (5 mg total) by mouth 3 (three) times daily as needed for muscle spasms.  Insomnia secondary to chronic pain -     Discontinue: eszopiclone (LUNESTA) 2 MG TABS tablet; Take 1 tablet (2 mg total) by mouth at bedtime as needed for sleep. Take immediately before bedtime -     eszopiclone (LUNESTA) 2 MG TABS tablet; Take 1 tablet (2 mg total) by mouth at bedtime as needed for sleep. Take immediately before bedtime  Essential hypertension- Her blood pressure is well controlled. -     Discontinue: indapamide (LOZOL) 1.25 MG tablet; Take 1 tablet (1.25 mg total) by mouth daily. -     Discontinue: losartan (COZAAR) 100 MG tablet; Take 1 tablet (100 mg total) by mouth daily. -     Discontinue: potassium chloride SA (KLOR-CON M) 20 MEQ tablet; Take 1 tablet (20 mEq total) by mouth 2 (two) times daily. -     indapamide (LOZOL) 1.25 MG tablet; Take  1 tablet (1.25 mg total) by mouth daily. -     losartan (COZAAR) 100 MG tablet; Take 1 tablet (100 mg total) by mouth daily. -     potassium chloride SA (KLOR-CON M) 20 MEQ tablet; Take 1 tablet (20 mEq total) by mouth 2 (two) times daily.  Type II diabetes mellitus with manifestations ([place])- Her blood sugar is adequately well controlled. -     Discontinue: losartan (COZAAR) 100 MG tablet; Take 1 tablet (100 mg total) by mouth daily. -     Basic metabolic panel; Future -     Hemoglobin A1c; Future -     Hemoglobin A1c -     Basic metabolic panel -     losartan (COZAAR) 100 MG tablet; Take 1 tablet (100 mg total) by mouth daily.  Degenerative disc disease, lumbar -     Discontinue: nabumetone (RELAFEN) 500 MG tablet; Take 1 tablet (500 mg total) by mouth 2 (two) times daily as needed. -     nabumetone (RELAFEN) 500 MG tablet; Take 1 tablet (500 mg total) by mouth 2 (two) times daily as needed.  Osteoarthritis of spine with myelopathy, unspecified spinal region -     Discontinue: nabumetone (RELAFEN) 500 MG tablet; Take 1 tablet (500 mg total) by mouth 2 (two) times daily as needed. -     nabumetone (RELAFEN) 500 MG tablet; Take 1 tablet (500 mg total) by mouth 2 (two) times daily as needed.  Primary osteoarthritis involving multiple joints -     Discontinue: nabumetone (RELAFEN) 500 MG tablet; Take 1 tablet (500 mg total) by mouth 2 (two) times daily as needed. -     nabumetone (RELAFEN) 500 MG tablet; Take 1 tablet (500 mg total) by mouth 2 (two) times daily as needed.  Hypokalemia -     Discontinue: potassium chloride SA (KLOR-CON M) 20 MEQ tablet; Take 1 tablet (20 mEq total) by mouth 2 (two) times daily. -     Basic metabolic panel; Future -     Basic metabolic panel -     potassium chloride SA (KLOR-CON M) 20 MEQ tablet; Take 1 tablet (20 mEq total) by mouth 2 (two)  times daily.  Hyperlipidemia, unspecified hyperlipidemia type- LDL goal achieved. Doing well on the statin  -      Lipid panel; Future -     Lipid panel  Iron deficiency anemia secondary to inadequate dietary iron intake -     CBC with Differential/Platelet; Future -     IBC + Ferritin; Future -     IBC + Ferritin -     CBC with Differential/Platelet  Esophageal dysphagia -     Ambulatory referral to Gastroenterology  Other orders -     Iron-FA-B Cmp-C-Biot-Probiotic (FUSION PLUS) CAPS; Take 1 tablet by mouth daily.   I have changed Alexandria [name]'s Fusion Plus. I am also having her maintain her Bimatoprost (LUMIGAN OP), Turmeric, lidocaine, fluocinonide cream, rosuvastatin, metoprolol tartrate, cyclobenzaprine, eszopiclone, indapamide, losartan, nabumetone, and potassium chloride SA.  Meds ordered this encounter  Medications   DISCONTD: cyclobenzaprine (FLEXERIL) 5 MG tablet    Sig: Take 1 tablet (5 mg total) by mouth 3 (three) times daily as needed for muscle spasms.    Dispense:  270 tablet    Refill:  0   DISCONTD: eszopiclone (LUNESTA) 2 MG TABS tablet    Sig: Take 1 tablet (2 mg total) by mouth at bedtime as needed for sleep. Take immediately before bedtime    Dispense:  90 tablet    Refill:  1   DISCONTD: indapamide (LOZOL) 1.25 MG tablet    Sig: Take 1 tablet (1.25 mg total) by mouth daily.    Dispense:  90 tablet    Refill:  0   Iron-FA-B Cmp-C-Biot-Probiotic (FUSION PLUS) CAPS    Sig: Take 1 tablet by mouth daily.    Dispense:  30 capsule    Refill:  2   DISCONTD: losartan (COZAAR) 100 MG tablet    Sig: Take 1 tablet (100 mg total) by mouth daily.    Dispense:  90 tablet    Refill:  0   DISCONTD: nabumetone (RELAFEN) 500 MG tablet    Sig: Take 1 tablet (500 mg total) by mouth 2 (two) times daily as needed.    Dispense:  180 tablet    Refill:  0   DISCONTD: potassium chloride SA (KLOR-CON M) 20 MEQ tablet    Sig: Take 1 tablet (20 mEq total) by mouth 2 (two) times daily.    Dispense:  180 tablet    Refill:  0   cyclobenzaprine (FLEXERIL) 5 MG tablet    Sig: Take 1 tablet  (5 mg total) by mouth 3 (three) times daily as needed for muscle spasms.    Dispense:  270 tablet    Refill:  0   eszopiclone (LUNESTA) 2 MG TABS tablet    Sig: Take 1 tablet (2 mg total) by mouth at bedtime as needed for sleep. Take immediately before bedtime    Dispense:  90 tablet    Refill:  1   indapamide (LOZOL) 1.25 MG tablet    Sig: Take 1 tablet (1.25 mg total) by mouth daily.    Dispense:  90 tablet    Refill:  0   losartan (COZAAR) 100 MG tablet    Sig: Take 1 tablet (100 mg total) by mouth daily.    Dispense:  90 tablet    Refill:  0   nabumetone (RELAFEN) 500 MG tablet    Sig: Take 1 tablet (500 mg total) by mouth 2 (two) times daily as needed.    Dispense:  180 tablet    Refill:  0   potassium chloride SA (KLOR-CON M) 20 MEQ tablet    Sig: Take 1 tablet (20 mEq total) by mouth 2 (two) times daily.    Dispense:  180 tablet    Refill:  0     Follow-up: Return in about 6 months (around 03/17/2022).  Scarlette Calico, MD

## 2021-09-21 MED ORDER — ACCU-CHEK GUIDE ME W/DEVICE KIT: 1.0000 | PACK | Freq: Two times a day (BID) | 0 refills | Status: DC

## 2021-09-21 MED ORDER — ACCU-CHEK GUIDE VI STRP: 1.0000 | ORAL_STRIP | Freq: Two times a day (BID) | 5 refills | Status: DC

## 2021-09-21 MED ORDER — ACCU-CHEK GUIDE CONTROL VI LIQD: 1.0000 | Freq: Two times a day (BID) | 2 refills | Status: DC

## 2021-10-09 DIAGNOSIS — H401131 Primary open-angle glaucoma, bilateral, mild stage: Secondary | ICD-10-CM | POA: Diagnosis not present

## 2021-10-09 DIAGNOSIS — H16223 Keratoconjunctivitis sicca, not specified as Sjogren's, bilateral: Secondary | ICD-10-CM | POA: Diagnosis not present

## 2021-10-09 DIAGNOSIS — E119 Type 2 diabetes mellitus without complications: Secondary | ICD-10-CM | POA: Diagnosis not present

## 2021-10-09 DIAGNOSIS — D3130 Benign neoplasm of unspecified choroid: Secondary | ICD-10-CM | POA: Diagnosis not present

## 2021-10-10 ENCOUNTER — Telehealth: Payer: Self-pay | Admitting: Podiatry

## 2021-10-10 NOTE — Telephone Encounter (Signed)
Pt called wanting to know if ppwk has been sent to the insurance to get approval for orthotics. She has already been casted/fitted.

## 2021-11-19 NOTE — Telephone Encounter (Signed)
Note not needed 

## 2021-11-20 ENCOUNTER — Telehealth: Payer: Self-pay | Admitting: Podiatry

## 2021-11-20 NOTE — Telephone Encounter (Signed)
Lvm for patient to contact office to schedule a appointment for OPU.

## 2021-11-21 ENCOUNTER — Telehealth: Payer: Self-pay | Admitting: Podiatry

## 2021-11-21 NOTE — Telephone Encounter (Signed)
Left message for patient to call and schedule appointment to pick up diabetic shoes

## 2021-11-24 ENCOUNTER — Other Ambulatory Visit: Payer: Self-pay | Admitting: Internal Medicine

## 2021-11-25 ENCOUNTER — Encounter: Payer: Self-pay | Admitting: Podiatry

## 2021-11-26 ENCOUNTER — Ambulatory Visit (INDEPENDENT_AMBULATORY_CARE_PROVIDER_SITE_OTHER): Payer: Medicare Other

## 2021-11-26 ENCOUNTER — Other Ambulatory Visit: Payer: Self-pay | Admitting: Internal Medicine

## 2021-11-26 DIAGNOSIS — E118 Type 2 diabetes mellitus with unspecified complications: Secondary | ICD-10-CM | POA: Diagnosis not present

## 2021-11-26 DIAGNOSIS — L84 Corns and callosities: Secondary | ICD-10-CM

## 2021-11-26 DIAGNOSIS — G8929 Other chronic pain: Secondary | ICD-10-CM

## 2021-11-26 DIAGNOSIS — M2012 Hallux valgus (acquired), left foot: Secondary | ICD-10-CM | POA: Diagnosis not present

## 2021-11-26 NOTE — Telephone Encounter (Signed)
Patient called requesting this script be sent in today to the [facility].

## 2021-11-26 NOTE — Progress Notes (Signed)
Patient presents today to pick up diabetic shoes and insoles.  Patient was dispensed 1 pair of diabetic shoes and 3 pairs of foam casted diabetic insoles. Fit was satisfactory. Instructions for break-in and wear was reviewed and a copy was given to the patient.   Re-appointment for regularly scheduled diabetic foot care visits or if they should experience any trouble with the shoes or insoles.  

## 2021-12-04 ENCOUNTER — Encounter: Payer: Self-pay | Admitting: Gastroenterology

## 2022-01-05 ENCOUNTER — Other Ambulatory Visit: Payer: Self-pay | Admitting: Internal Medicine

## 2022-01-05 DIAGNOSIS — Z1231 Encounter for screening mammogram for malignant neoplasm of breast: Secondary | ICD-10-CM

## 2022-01-16 ENCOUNTER — Telehealth: Payer: Self-pay | Admitting: Internal Medicine

## 2022-01-16 ENCOUNTER — Other Ambulatory Visit: Payer: Self-pay | Admitting: Internal Medicine

## 2022-01-16 DIAGNOSIS — E785 Hyperlipidemia, unspecified: Secondary | ICD-10-CM

## 2022-01-16 MED ORDER — ROSUVASTATIN CALCIUM 20 MG PO TABS: 20.0000 mg | ORAL_TABLET | Freq: Every day | ORAL | 1 refills | Status: DC

## 2022-01-16 NOTE — Telephone Encounter (Signed)
Patient needs her rosuvastatin refilled - Please send to the [facility] in [place].  Last visit:  09/25/2021  No future visit scheduled - patient will call back to schedule

## 2022-01-20 ENCOUNTER — Ambulatory Visit: Payer: Medicare Other | Admitting: *Deleted

## 2022-01-20 NOTE — Patient Instructions (Signed)

## 2022-01-20 NOTE — Progress Notes (Signed)
Erroneous encounter

## 2022-01-27 ENCOUNTER — Ambulatory Visit
Admission: RE | Admit: 2022-01-27 | Discharge: 2022-01-27 | Disposition: A | Discharge status: Home / Self Care | Payer: Medicare Other | Source: Ambulatory Visit | Attending: Internal Medicine | Admitting: Internal Medicine

## 2022-01-27 DIAGNOSIS — Z1231 Encounter for screening mammogram for malignant neoplasm of breast: Secondary | ICD-10-CM | POA: Diagnosis not present

## 2022-01-28 ENCOUNTER — Ambulatory Visit (INDEPENDENT_AMBULATORY_CARE_PROVIDER_SITE_OTHER): Payer: Medicare Other | Admitting: Gastroenterology

## 2022-01-28 ENCOUNTER — Encounter: Payer: Self-pay | Admitting: Gastroenterology

## 2022-01-28 VITALS — BP 126/70 | HR 60 | Ht 66.0 in | Wt 171.8 lb

## 2022-01-28 DIAGNOSIS — R079 Chest pain, unspecified: Secondary | ICD-10-CM

## 2022-01-28 DIAGNOSIS — R1310 Dysphagia, unspecified: Secondary | ICD-10-CM | POA: Diagnosis not present

## 2022-01-28 NOTE — Patient Instructions (Signed)
Patient advised to avoid spicy, acidic, citrus, chocolate, mints, fruit and fruit juices.  Limit the intake of caffeine, alcohol and Soda.  Don't exercise too soon after eating.  Don't lie down within 3-4 hours of eating.  Elevate the head of your bed.  You have been scheduled for an endoscopy. Please follow written instructions given to you at your visit today. If you use inhalers (even only as needed), please bring them with you on the day of your procedure.  The [facility] providers would like to encourage you to use [facility] to communicate with providers for non-urgent requests or questions.  Due to long hold times on the telephone, sending your provider a message by [facility] may be a faster and more efficient way to get a response.  Please allow 48 business hours for a response.  Please remember that this is for non-urgent requests.   Due to recent changes in healthcare laws, you may see the results of your imaging and laboratory studies on MyChart before your provider has had a chance to review them.  We understand that in some cases there may be results that are confusing or concerning to you. Not all laboratory results come back in the same time frame and the provider may be waiting for multiple results in order to interpret others.  Please give [place] 48 hours in order for your provider to thoroughly review all the results before contacting the office for clarification of your results.   Thank you for choosing me and [facility].  Pricilla Riffle. Dagoberto Ligas., MD., Marval Regal

## 2022-01-28 NOTE — Progress Notes (Signed)
Assessment    Chest pain. Suspected GERD. R/O esophagitis Personal history of adenomatous colon polyps no longer in surveillance Moderate pandiverticulosis   Recommendations   Schedule EGD.  Follow antireflux measures. The risks (including bleeding, perforation, infection, missed lesions, medication reactions and possible hospitalization or surgery if complications occur), benefits, and alternatives to endoscopy with possible biopsy and possible dilation were discussed with the patient and they consent to proceed.   Patient offered a PPI or H2 receptor antagonist to take daily however she would like to review the findings at EGD before deciding about medications   HPI   Chief complaint: chest pain  Patient profile:  Alexandria [name] is a 81 y.o. female referred by Scarlette Calico, MD for evaluation of chest pain.  She relates an intermittent pressure sensation in her chest leading to chest pain for a few years.  Her symptoms are not related to exertion or stress.  They are not related to swallowing.  Symptoms sometimes occur during the day and sometimes at night.  She relates symptoms 2-3 times per month.  Referral information indicated that she had difficulty swallowing however she denies any dysphagia, odynophagia symptoms.  She relates an upward motion of the symptoms from the epigastrium toward her neck symptoms and the symptoms are reduced with Mylanta. She has a history of GERD and is not currently on acid reducing medications.  She relates she took Nexium for a few weeks with good control of her symptoms.  She relates 1 or 2 days with nausea and vomiting however that has not been a consistent symptom.  Chest and coronary artery CT from May 2023 below.  Echocardiogram from April 2023 was unremarkable.  Denies weight loss, abdominal pain, constipation, diarrhea, change in stool caliber, melena, hematochezia, dysphagia.    Previous Labs / Imaging::    Latest Ref Rng & Units 09/15/2021    11:23 AM 06/12/2021    3:40 PM 11/13/2020   10:27 AM  CBC  WBC 4.0 - 10.5 K/uL 3.9  4.8  3.9   Hemoglobin 12.0 - 15.0 g/dL 11.3  11.3  12.0   Hematocrit 36.0 - 46.0 % 34.1  34.8  36.5   Platelets 150.0 - 400.0 K/uL 231.0  264.0  253.0     No results found for: "LIPASE"    Latest Ref Rng & Units 09/15/2021   11:23 AM 07/24/2021    2:10 PM 06/12/2021    3:40 PM  CMP  Glucose 70 - 99 mg/dL 97  98  102   BUN 6 - 23 mg/dL _0 Creatinine 0.40 - 1.20 mg/dL 0.96  0.91  1.01   Sodium 135 - 145 mEq/L 139  140  142   Potassium 3.5 - 5.1 mEq/L 3.8  4.0  3.7   Chloride 96 - 112 mEq/L 103  100  105   CO2 19 - 32 mEq/L _1 Calcium 8.4 - 10.5 mg/dL 9.4  9.2  9.3      Previous GI evaluation    Endoscopies:  Colonoscopy Aug 2018: moderate pandiverticulosis, internal hemorrhoids, 1 small tubular adenoma  Imaging:  CT CORONARY MORPH W/CTA COR W/SCORE W/CA W/CM &/OR WO/CM Addendum: ADDENDUM REPORT: 08/07/2021 09:53   CLINICAL DATA:  Chest pain   EXAM:  Cardiac/Coronary CTA   TECHNIQUE:  A non-contrast, gated CT scan was obtained with axial slices of 3 mm  through the heart for calcium scoring. Calcium scoring was  performed  using the Agatston method. A 120 kV prospective, gated, contrast  cardiac scan was obtained. Gantry rotation speed was 250 msecs and  collimation was 0.6 mm. Two sublingual nitroglycerin tablets (0.8  mg) were given. The 3D data set was reconstructed in 5% intervals of  the 35-75% of the R-R cycle. Diastolic phases were analyzed on a  dedicated workstation using MPR, MIP, and VRT modes. The patient  received 95 cc of contrast.   FINDINGS:  Image quality: Excellent.   Noise artifact is: Limited.   Coronary Arteries:  Normal coronary origin.  Right dominance.   Left main: The left main is a large caliber vessel with a normal  take off from the left coronary cusp that bifurcates to form a left  anterior descending artery and a left circumflex  artery. There is no  plaque or stenosis.   Left anterior descending artery: The LAD is patent without evidence  of plaque or stenosis. The LAD gives off 3 patent diagonal branches.   Left circumflex artery: The LCX is non-dominant and patent with no  evidence of plaque or stenosis. The LCX gives off 2 patent obtuse  marginal branches.   Right coronary artery: The RCA is dominant with normal take off from  the right coronary cusp. The RCA terminates as a PDA and right  posterolateral branch. There is mild calcified plaque in the  proximal, mid and distal RCA with associated stenosis of 25-49%.   Right Atrium: Right atrial size is within normal limits.   Right Ventricle: The right ventricular cavity is within normal  limits.   Left Atrium: Left atrial size is normal in size with no left atrial  appendage filling defect.   Left Ventricle: The ventricular cavity size is within normal limits.  There are no stigmata of prior infarction. There is no abnormal  filling defect.   Pulmonary arteries: Normal in size without proximal filling defect.   Pulmonary veins: Normal pulmonary venous drainage.   Pericardium: Normal thickness with no significant effusion or  calcium present.   Cardiac valves: The aortic valve is trileaflet without significant  calcification. The mitral valve is normal structure without  significant calcification.   Aorta: Normal caliber with no significant disease.   Extra-cardiac findings: See attached radiology report for  non-cardiac structures.   IMPRESSION:  1. Coronary calcium score of 15.6. This was 39th percentile for  age-, sex, and race-matched controls.   2.  Normal coronary origin with right dominance.   3.  Mild atherosclerosis.  CAD RADS 2.   4.  Recommend preventive therapy and risk factor modification.   5.  Consider non atherosclerotic causes of chest pain.   RECOMMENDATIONS:  1. CAD-RADS 0: No evidence of CAD (0%). Consider  non-atherosclerotic  causes of chest pain.   2. CAD-RADS 1: Minimal non-obstructive CAD (0-24%). Consider  non-atherosclerotic causes of chest pain. Consider preventive  therapy and risk factor modification.   3. CAD-RADS 2: Mild non-obstructive CAD (25-49%). Consider  non-atherosclerotic causes of chest pain. Consider preventive  therapy and risk factor modification.   4. CAD-RADS 3: Moderate stenosis. Consider symptom-guided  anti-ischemic pharmacotherapy as well as risk factor modification  per guideline directed care. Additional analysis with CT FFR will be  submitted.   5. CAD-RADS 4: Severe stenosis. (70-99% or > 50% left main). Cardiac  catheterization or CT FFR is recommended. Consider symptom-guided  anti-ischemic pharmacotherapy as well as risk factor modification  per guideline directed care. Invasive coronary angiography  recommended with revascularization per published guideline  statements.   6. CAD-RADS 5: Total coronary occlusion (100%). Consider cardiac  catheterization or viability assessment. Consider symptom-guided  anti-ischemic pharmacotherapy as well as risk factor modification  per guideline directed care.   7. CAD-RADS N: Non-diagnostic study. Obstructive CAD can't be  excluded. Alternative evaluation is recommended.   Fransico Him, MD   Electronically Signed    By: Fransico Him M.D.    On: 08/07/2021 09:53 Narrative: EXAM: OVER-READ INTERPRETATION  CT CHEST  The following report is an over-read performed by radiologist Dr. Vinnie Langton of [facility], [place] on 08/07/2021. This over-read does not include interpretation of cardiac or coronary anatomy or pathology. The coronary calcium score/coronary CTA interpretation by the cardiologist is attached.  COMPARISON:  No priors.  FINDINGS: Within the visualized portions of the thorax there are no suspicious appearing pulmonary nodules or masses, there is no acute consolidative airspace  disease, no pleural effusions, no pneumothorax and no lymphadenopathy. Visualized portions of the upper abdomen are unremarkable. There are no aggressive appearing lytic or blastic lesions noted in the visualized portions of the skeleton.  IMPRESSION: 1. No significant incidental noncardiac findings are noted.  Electronically Signed: By: Vinnie Langton M.D. On: 08/07/2021 08:16    Past Medical History:  Diagnosis Date   Anemia, iron deficiency    Cataracts, bilateral    Colon polyp 09/2006   adenomatous   Diabetes ([place])    Fibrocystic breast disease    GERD (gastroesophageal reflux disease)    Glaucoma, right eye    Hyperlipidemia    Hypertension    LBP (low back pain)    Osteoarthritis    Osteopenia    Past Surgical History:  Procedure Laterality Date   ABDOMINAL HYSTERECTOMY     BREAST EXCISIONAL BIOPSY Right    BREAST LUMPECTOMY     RIGHT 22 years ago   CHOLECYSTECTOMY     Family History  Problem Relation Age of Onset   Arthritis Other    Hypertension Other    Colon cancer Neg Hx    Esophageal cancer Neg Hx    Rectal cancer Neg Hx    Stomach cancer Neg Hx    Breast cancer Neg Hx    Social History   Tobacco Use   Smoking status: Never   Smokeless tobacco: Never  Substance Use Topics   Alcohol use: No   Drug use: No   Current Outpatient Medications  Medication Sig Dispense Refill   Bimatoprost (LUMIGAN OP) Apply to eye.     Blood Glucose Calibration (ACCU-CHEK GUIDE CONTROL) LIQD 1 Act by In Vitro route 2 (two) times daily. 1 each 2   Blood Glucose Monitoring Suppl (ACCU-CHEK GUIDE ME) w/Device KIT 1 Act by Does not apply route 2 (two) times daily. 2 kit 0   cyclobenzaprine (FLEXERIL) 5 MG tablet Take 1 tablet (5 mg total) by mouth 3 (three) times daily as needed for muscle spasms. 270 tablet 0   eszopiclone (LUNESTA) 2 MG TABS tablet TAKE ONE TABLET BY MOUTH EVERY DAY AT BEDTIME AS NEEDED FOR SLEEP. TAKE IMMEDIATELY BEFORE BEDTIME - (AVOID HIGH  FAT/HEAVY MEAL) 90 tablet 3   fluocinonide cream (LIDEX) 5.88 % Apply 1 application. topically 2 (two) times daily. 60 g 1   glucose blood (ACCU-CHEK GUIDE) test strip 1 each by Other route 2 (two) times daily. Use as instructed 100 each 5   indapamide (LOZOL) 1.25 MG tablet Take 1 tablet (1.25 mg total) by mouth  daily. 90 tablet 0   Iron-FA-B Cmp-C-Biot-Probiotic (FUSION PLUS) CAPS TAKE 1 CAPSULE BY MOUTH EVERY DAY 30 capsule 2   lidocaine (LIDODERM) 5 % Place 1 patch onto the skin daily. Remove & Discard patch within 12 hours or as directed by MD 90 patch 1   losartan (COZAAR) 100 MG tablet Take 1 tablet (100 mg total) by mouth daily. 90 tablet 0   metoprolol tartrate (LOPRESSOR) 25 MG tablet TAKE 1 TABLET 2 HR PRIOR TO CARDIAC PROCEDURE 1 tablet 0   nabumetone (RELAFEN) 500 MG tablet Take 1 tablet (500 mg total) by mouth 2 (two) times daily as needed. 180 tablet 0   potassium chloride SA (KLOR-CON M) 20 MEQ tablet Take 1 tablet (20 mEq total) by mouth 2 (two) times daily. 180 tablet 0   rosuvastatin (CRESTOR) 20 MG tablet Take 1 tablet (20 mg total) by mouth daily. 90 tablet 1   Turmeric 500 MG CAPS Take 1 capsule by mouth daily. 90 capsule 1   No current facility-administered medications for this visit.   Allergies  Allergen Reactions   Oxycodone-Acetaminophen     REACTION: nausea and vomiting   Tramadol Hcl     REACTION: nausea and vomiting    Review of Systems: All other systems reviewed and negative except where noted in HPI.    Physical Exam    Wt Readings from Last 3 Encounters:  09/15/21 175 lb (79.4 kg)  07/24/21 181 lb 6.4 oz (82.3 kg)  07/21/21 191 lb (86.6 kg)    There were no vitals taken for this visit. Constitutional:  Generally well appearing female in no acute distress. Psychiatric: Pleasant. Normal mood and affect. Behavior is normal. HEENT: Pupils normal.  Conjunctivae are normal. No scleral icterus. Neck supple.  Cardiovascular: Normal rate, regular  rhythm. No edema Pulmonary/chest: Effort normal and breath sounds normal. No wheezing, rales or rhonchi. Abdominal: Soft, nondistended, nontender. Bowel sounds active throughout. There are no masses palpable. No hepatomegaly. Rectal: Not done Neurological: Alert and oriented to person place and time. Skin: Skin is warm and dry. No rashes noted.  Lucio Edward, MD   cc:  Referring Provider Janith Lima, MD

## 2022-02-02 ENCOUNTER — Ambulatory Visit (INDEPENDENT_AMBULATORY_CARE_PROVIDER_SITE_OTHER): Payer: Medicare Other

## 2022-02-02 DIAGNOSIS — Z0000 Encounter for general adult medical examination without abnormal findings: Secondary | ICD-10-CM

## 2022-02-02 NOTE — Patient Instructions (Signed)
It was great speaking with you today!  Please schedule your next Medicare Wellness Visit with your Nurse Health Advisor in 1 year by calling [phone]. 

## 2022-02-02 NOTE — Progress Notes (Signed)
Subjective:   Alexandria [name] is a 81 y.o. female who presents for Medicare Annual (Subsequent) preventive examination.  I connected with Kahdijah today by telephone and verified that I am speaking with the correct person using two identifiers. I discussed the limitations, risks, security and privacy concerns of performing an evaluation and management service by telephone and the availability of in person appointments. I also discussed with the patient that there may be a patient responsible charge related to this service. The patient expressed understanding and agreed to proceed. Location patient: home Location provider: [place] Persons participating in the visit: Deneane and Jillene Bucks, [place].  Time Spent with patient on telephone encounter: 15 mins  Review of Systems    No ROS. Medicare Wellness Telephone Visit. Additional risk factors are reflected in social history. Cardiac Risk Factors include: advanced age (>64mn, >>97women);diabetes mellitus;dyslipidemia;hypertension     Objective:    Today's Vitals   02/02/22 0859  PainSc: 2    There is no height or weight on file to calculate BMI.     02/02/2022    9:20 AM 05/08/2020    8:50 AM 04/09/2020   11:56 AM 01/15/2017   11:39 AM 10/30/2016    7:38 AM  Advanced Directives  Does Patient Have a Medical Advance Directive? No No No Yes No  Type of Advance Directive    [place] Chart?    No - copy requested   Would patient like information on creating a medical advance directive? Yes (MAU/Ambulatory/Procedural Areas - Information given) Yes (MAU/Ambulatory/Procedural Areas - Information given) No - Patient declined      Current Medications (verified) Outpatient Encounter Medications as of 02/02/2022  Medication Sig   Bimatoprost (LUMIGAN OP) Apply to eye.   Blood Glucose Calibration (ACCU-CHEK GUIDE CONTROL) LIQD 1 Act by In Vitro route 2 (two) times daily.    Blood Glucose Monitoring Suppl (ACCU-CHEK GUIDE ME) w/Device KIT 1 Act by Does not apply route 2 (two) times daily.   cyclobenzaprine (FLEXERIL) 5 MG tablet Take 1 tablet (5 mg total) by mouth 3 (three) times daily as needed for muscle spasms.   eszopiclone (LUNESTA) 2 MG TABS tablet TAKE ONE TABLET BY MOUTH EVERY DAY AT BEDTIME AS NEEDED FOR SLEEP. TAKE IMMEDIATELY BEFORE BEDTIME - (AVOID HIGH FAT/HEAVY MEAL)   fluocinonide cream (LIDEX) 09.48% Apply 1 application. topically 2 (two) times daily.   glucose blood (ACCU-CHEK GUIDE) test strip 1 each by Other route 2 (two) times daily. Use as instructed   indapamide (LOZOL) 1.25 MG tablet Take 1 tablet (1.25 mg total) by mouth daily.   Iron-FA-B Cmp-C-Biot-Probiotic (FUSION PLUS) CAPS TAKE 1 CAPSULE BY MOUTH EVERY DAY   lidocaine (LIDODERM) 5 % Place 1 patch onto the skin daily. Remove & Discard patch within 12 hours or as directed by MD   losartan (COZAAR) 100 MG tablet Take 1 tablet (100 mg total) by mouth daily.   nabumetone (RELAFEN) 500 MG tablet Take 1 tablet (500 mg total) by mouth 2 (two) times daily as needed.   potassium chloride SA (KLOR-CON M) 20 MEQ tablet Take 1 tablet (20 mEq total) by mouth 2 (two) times daily.   rosuvastatin (CRESTOR) 20 MG tablet Take 1 tablet (20 mg total) by mouth daily.   Turmeric 500 MG CAPS Take 1 capsule by mouth daily.   [DISCONTINUED] metoprolol tartrate (LOPRESSOR) 25 MG tablet TAKE 1 TABLET 2 HR PRIOR  TO CARDIAC PROCEDURE (Patient not taking: Reported on 01/28/2022)   No facility-administered encounter medications on file as of 02/02/2022.    Allergies (verified) Oxycodone-acetaminophen and Tramadol hcl   History: Past Medical History:  Diagnosis Date   Anemia, iron deficiency    Cataracts, bilateral    Colon polyp 09/2006   adenomatous   Diabetes ([place])    Fibrocystic breast disease    GERD (gastroesophageal reflux disease)    Glaucoma, right eye    Hyperlipidemia    Hypertension    LBP (low  back pain)    Osteoarthritis    Osteopenia    Past Surgical History:  Procedure Laterality Date   ABDOMINAL HYSTERECTOMY     BREAST EXCISIONAL BIOPSY Right    BREAST LUMPECTOMY     RIGHT 22 years ago   CHOLECYSTECTOMY     Family History  Problem Relation Age of Onset   Arthritis Other    Hypertension Other    Colon cancer Neg Hx    Esophageal cancer Neg Hx    Rectal cancer Neg Hx    Stomach cancer Neg Hx    Breast cancer Neg Hx    Social History   Socioeconomic History   Marital status: Widowed    Spouse name: Not on file   Number of children: 2   Years of education: Not on file   Highest education level: Not on file  Occupational History   Occupation: RN L&D [employer]  Tobacco Use   Smoking status: Never   Smokeless tobacco: Never  Vaping Use   Vaping Use: Never used  Substance and Sexual Activity   Alcohol use: No   Drug use: No   Sexual activity: Not Currently    Birth control/protection: Surgical  Other Topics Concern   Not on file  Social History Narrative   [employer] work.  Lives at home.  Two children.     Social Determinants of Health   Financial Resource Strain: Low Risk  (02/02/2022)   Overall Financial Resource Strain (CARDIA)    Difficulty of Paying Living Expenses: Not hard at all  Food Insecurity: No Food Insecurity (02/02/2022)   Hunger Vital Sign    Worried About Running Out of Food in the Last Year: Never true    Ran Out of Food in the Last Year: Never true  Transportation Needs: No Transportation Needs (02/02/2022)   PRAPARE - Hydrologist (Medical): No    Lack of Transportation (Non-Medical): No  Physical Activity: Insufficiently Active (02/02/2022)   Exercise Vital Sign    Days of Exercise per Week: 1 day    Minutes of Exercise per Session: 40 min  Stress: No Stress Concern Present (02/02/2022)   [place]    Feeling of Stress : Not at all  Social  Connections: Moderately Integrated (02/02/2022)   Social Connection and Isolation Panel [NHANES]    Frequency of Communication with Friends and Family: More than three times a week    Frequency of Social Gatherings with Friends and Family: Once a week    Attends Religious Services: More than 4 times per year    Active Member of [employer] or Organizations: Yes    Attends Archivist Meetings: More than 4 times per year    Marital Status: Widowed    Tobacco Counseling Counseling given: Not Answered   Clinical Intake:  Pre-visit preparation completed: Yes  Pain : 0-10 Pain Score: 2  Pain Type: Chronic pain Pain Location: Back (degenerative disk) Pain Orientation: Lower Pain Descriptors / Indicators: Constant Pain Onset: More than a month ago Pain Frequency: Constant     BMI - recorded: 28 Nutritional Status: BMI 25 -29 Overweight Nutritional Risks: None Diabetes: Yes CBG done?: No Did pt. bring in CBG monitor from home?: No  How often do you need to have someone help you when you read instructions, pamphlets, or other written materials from your doctor or pharmacy?: 1 - Never What is the last grade level you completed in school?: college  Nutrition Risk Assessment:  Has the patient had any N/V/D within the last 2 months?  No  Does the patient have any non-healing wounds?  No  Has the patient had any unintentional weight loss or weight gain?  No   Diabetes:  Is the patient diabetic?  Yes  If diabetic, was a CBG obtained today?  No  Did the patient bring in their glucometer from home?  No  How often do you monitor your CBG's? A couple times a week.   Financial Strains and Diabetes Management:  Are you having any financial strains with the device, your supplies or your medication? No .  Does the patient want to be seen by Chronic Care Management for management of their diabetes?  No  Would the patient like to be referred to a Nutritionist or for Diabetic  Management?  No   Diabetic Exams:  Diabetic Eye Exam: Patient had eye exam on 2022, results pending. Diabetic Foot Exam: Completed 06/12/2021    Interpreter Needed?: No  Information entered by :: Jillene Bucks, [place]   Activities of Daily Living    02/02/2022    9:21 AM 01/29/2022    5:16 PM  In your present state of health, do you have any difficulty performing the following activities:  Hearing? 0 0  Vision? 0 1  Difficulty concentrating or making decisions? 0 0  Walking or climbing stairs? 0 0  Dressing or bathing? 0 0  Doing errands, shopping? 0 0  Preparing Food and eating ? N N  Using the Toilet? N N  In the past six months, have you accidently leaked urine? N N  Do you have problems with loss of bowel control? N N  Managing your Medications? N N  Managing your Finances? N N  Housekeeping or managing your Housekeeping? N N    Patient Care Team: Janith Lima, MD as PCP - General Minus Breeding, MD as PCP - Cardiology (Cardiology) Marylynn Pearson, MD as Consulting Physician (Ophthalmology)  Indicate any recent Medical Services you may have received from other than [facility] providers in the past year (date may be approximate).     Assessment:   This is a routine wellness examination for Marayah.  Hearing/Vision screen Patient denied any hearing difficulty. No hearing aids. Patient does wear corrective lenses.  Dietary issues and exercise activities discussed: Current Exercise Habits: Home exercise routine, Type of exercise: walking, Time (Minutes): 40, Frequency (Times/Week): 1, Weekly Exercise (Minutes/Week): 40, Intensity: Mild, Exercise limited by: None identified   Goals Addressed             This Visit's Progress    Patient Stated       I would like to lose 5-10 pounds this year.       Depression Screen    02/02/2022    9:19 AM 06/12/2021    2:44 PM 04/09/2020   11:54 AM 07/20/2019   10:39 AM  03/11/2018    9:56 AM 03/09/2018    4:45 PM 01/15/2017    11:41 AM  PHQ 2/9 Scores  PHQ - 2 Score 0 0 0 2 0 1 0  PHQ- 9 Score    4  8     Fall Risk    02/02/2022    9:20 AM 01/29/2022    5:16 PM 06/12/2021    2:44 PM 04/09/2020   11:56 AM 07/20/2019   10:38 AM  Fall Risk   Falls in the past year? 0 0 0 0 0  Number falls in past yr: 0   0 0  Injury with Fall? 0   0 0  Risk for fall due to : No Fall Risks   No Fall Risks No Fall Risks  Follow up Falls evaluation completed    Falls evaluation completed    FALL RISK PREVENTION PERTAINING TO THE HOME:  Any stairs in or around the home? Yes  If so, are there any without handrails? No  Home free of loose throw rugs in walkways, pet beds, electrical cords, etc? Yes  Adequate lighting in your home to reduce risk of falls? Yes   ASSISTIVE DEVICES UTILIZED TO PREVENT FALLS:  Life alert? Yes  Use of a cane, walker or w/c? No  Grab bars in the bathroom? No  Shower chair or bench in shower? No  Elevated toilet seat or a handicapped toilet? No   TIMED UP AND GO:  Was the test performed? No .  Length of time to ambulate 10 feet: N/A sec.   Patient stated that she has no issues with gait or balance; does not use any assistive devices.  Cognitive Function:  Patient is cogitatively intact.      02/02/2022    9:23 AM  6CIT Screen  What Year? 0 points  What month? 0 points  What time? 0 points  Count back from 20 0 points  Months in reverse 0 points  Repeat phrase 0 points  Total Score 0 points    Immunizations Immunization History  Administered Date(s) Administered   Fluad Quad(high Dose 65+) 03/06/2020   Influenza, High Dose Seasonal PF 01/09/2016, 01/12/2017, 02/19/2018, 02/13/2019   Influenza,inj,Quad PF,6+ Mos 12/21/2013, 01/09/2015   PFIZER(Purple Top)SARS-COV-2 Vaccination 04/18/2019, 05/09/2019, 03/07/2020   Pneumococcal Conjugate-13 12/21/2013   Pneumococcal Polysaccharide-23 10/06/2011, 11/15/2018   Td 10/17/2009   Zoster Recombinat (Shingrix) 09/26/2021   Zoster, Live  10/17/2009    TDAP status: Due, Education has been provided regarding the importance of this vaccine. Advised may receive this vaccine at local pharmacy or Health Dept. Aware to provide a copy of the vaccination record if obtained from local pharmacy or Health Dept. Verbalized acceptance and understanding.  Flu Vaccine status: Due, Education has been provided regarding the importance of this vaccine. Advised may receive this vaccine at local pharmacy or Health Dept. Aware to provide a copy of the vaccination record if obtained from local pharmacy or Health Dept. Verbalized acceptance and understanding.  Pneumococcal vaccine status: Up to date  Covid-19 vaccine status: Completed vaccines  Qualifies for Shingles Vaccine? Yes   Zostavax completed No   Shingrix Completed?: No.    Education has been provided regarding the importance of this vaccine. Patient has been advised to call insurance company to determine out of pocket expense if they have not yet received this vaccine. Advised may also receive vaccine at local pharmacy or Health Dept. Verbalized acceptance and understanding.  Screening Tests Health Maintenance  Topic Date  Due   TETANUS/TDAP  10/18/2019   OPHTHALMOLOGY EXAM  11/01/2019   Medicare Annual Wellness (AWV)  04/09/2021   INFLUENZA VACCINE  10/28/2021   Zoster Vaccines- Shingrix (2 of 2) 11/21/2021   COVID-19 Vaccine (4 - Pfizer risk series) 02/18/2022 (Originally 05/02/2020)   HEMOGLOBIN A1C  03/17/2022   FOOT EXAM  06/13/2022   Diabetic kidney evaluation - GFR measurement  09/16/2022   Diabetic kidney evaluation - Urine ACR  09/16/2022   Pneumonia Vaccine 1+ Years old  Completed   DEXA SCAN  Completed   Hepatitis C Screening  Completed   HPV VACCINES  Aged Out    Health Maintenance  Health Maintenance Due  Topic Date Due   TETANUS/TDAP  10/18/2019   OPHTHALMOLOGY EXAM  11/01/2019   Medicare Annual Wellness (AWV)  04/09/2021   INFLUENZA VACCINE  10/28/2021    Zoster Vaccines- Shingrix (2 of 2) 11/21/2021    Colorectal cancer screening: No longer required.   Mammogram status: No longer required due to age.  Bone Density status: Completed 08/09/2019. Results reflect: Bone density results: NORMAL. Repeat every N/A years.  Lung Cancer Screening: (Low Dose CT Chest recommended if Age 21-80 years, 30 pack-year currently smoking OR have quit w/in 15years.) does not qualify.   Lung Cancer Screening Referral: N/A  Additional Screening:  Hepatitis C Screening: does qualify; Completed 10/19/2019  Vision Screening: Recommended annual ophthalmology exams for early detection of glaucoma and other disorders of the eye. Is the patient up to date with their annual eye exam?  Yes  Who is the provider or what is the name of the office in which the patient attends annual eye exams? roy whitaker, MD If pt is not established with a provider, would they like to be referred to a provider to establish care? No .   Dental Screening: Recommended annual dental exams for proper oral hygiene  Community Resource Referral / Chronic Care Management: CRR required this visit?  No   CCM required this visit?  No      Plan:     I have personally reviewed and noted the following in the patient's chart:   Medical and social history Use of alcohol, tobacco or illicit drugs  Current medications and supplements including opioid prescriptions. Patient is not currently taking opioid prescriptions. Functional ability and status Nutritional status Physical activity Advanced directives List of other physicians Hospitalizations, surgeries, and ER visits in previous 12 months Vitals Screenings to include cognitive, depression, and falls Referrals and appointments  In addition, I have reviewed and discussed with patient certain preventive protocols, quality metrics, and best practice recommendations. A written personalized care plan for preventive services as well as general  preventive health recommendations were provided to patient.     Rossie Muskrat, [place]   02/02/2022   Nurse Notes:   Patient has an appointment set up to get her 2nd Shingles and RSV this Thursday, November 9th and is planning to get her Flu vaccine 2 weeks after.   Information on setting up Advance Directive sent to address on file.

## 2022-02-06 ENCOUNTER — Telehealth: Payer: Self-pay

## 2022-02-06 NOTE — Telephone Encounter (Signed)
Pt has been informed since she has medicare she would have to receive vaccine at the pharmacy. I have instructed her vaccine was done in office it would be $295 for vaccine plus $59 administration fee. She expressed understanding and will proceed at the pharmacy.

## 2022-02-06 NOTE — Telephone Encounter (Signed)
Patient is calling in asking if we can administer the second shingles vaccine. Went to [facility] and they told her that the authorization was denied by the insurance.

## 2022-02-12 ENCOUNTER — Other Ambulatory Visit: Payer: Self-pay | Admitting: Internal Medicine

## 2022-02-12 DIAGNOSIS — H16223 Keratoconjunctivitis sicca, not specified as Sjogren's, bilateral: Secondary | ICD-10-CM | POA: Diagnosis not present

## 2022-02-12 DIAGNOSIS — H401131 Primary open-angle glaucoma, bilateral, mild stage: Secondary | ICD-10-CM | POA: Diagnosis not present

## 2022-02-12 DIAGNOSIS — E118 Type 2 diabetes mellitus with unspecified complications: Secondary | ICD-10-CM

## 2022-02-12 DIAGNOSIS — D3130 Benign neoplasm of unspecified choroid: Secondary | ICD-10-CM | POA: Diagnosis not present

## 2022-02-12 DIAGNOSIS — E119 Type 2 diabetes mellitus without complications: Secondary | ICD-10-CM | POA: Diagnosis not present

## 2022-02-12 DIAGNOSIS — I10 Essential (primary) hypertension: Secondary | ICD-10-CM

## 2022-02-23 ENCOUNTER — Encounter: Payer: Self-pay | Admitting: Gastroenterology

## 2022-02-23 ENCOUNTER — Ambulatory Visit (AMBULATORY_SURGERY_CENTER): Payer: Medicare Other | Admitting: Gastroenterology

## 2022-02-23 VITALS — BP 133/55 | HR 59 | Temp 98.0°F | Resp 13 | Ht 66.0 in | Wt 171.0 lb

## 2022-02-23 DIAGNOSIS — R079 Chest pain, unspecified: Secondary | ICD-10-CM

## 2022-02-23 DIAGNOSIS — K2960 Other gastritis without bleeding: Secondary | ICD-10-CM | POA: Diagnosis not present

## 2022-02-23 DIAGNOSIS — B9681 Helicobacter pylori [H. pylori] as the cause of diseases classified elsewhere: Secondary | ICD-10-CM | POA: Diagnosis not present

## 2022-02-23 DIAGNOSIS — K219 Gastro-esophageal reflux disease without esophagitis: Secondary | ICD-10-CM | POA: Diagnosis not present

## 2022-02-23 DIAGNOSIS — N183 Chronic kidney disease, stage 3 unspecified: Secondary | ICD-10-CM | POA: Diagnosis not present

## 2022-02-23 DIAGNOSIS — K259 Gastric ulcer, unspecified as acute or chronic, without hemorrhage or perforation: Secondary | ICD-10-CM

## 2022-02-23 DIAGNOSIS — I10 Essential (primary) hypertension: Secondary | ICD-10-CM | POA: Diagnosis not present

## 2022-02-23 DIAGNOSIS — K2950 Unspecified chronic gastritis without bleeding: Secondary | ICD-10-CM | POA: Diagnosis not present

## 2022-02-23 DIAGNOSIS — E119 Type 2 diabetes mellitus without complications: Secondary | ICD-10-CM | POA: Diagnosis not present

## 2022-02-23 DIAGNOSIS — K2970 Gastritis, unspecified, without bleeding: Secondary | ICD-10-CM | POA: Diagnosis not present

## 2022-02-23 MED ORDER — PANTOPRAZOLE SODIUM 40 MG PO TBEC: 40.0000 mg | DELAYED_RELEASE_TABLET | Freq: Every day | ORAL | 3 refills | Status: AC

## 2022-02-23 MED ORDER — SODIUM CHLORIDE 0.9 % IV SOLN: 500.0000 mL | Freq: Once | INTRAVENOUS | Status: DC

## 2022-02-23 NOTE — Progress Notes (Signed)
Pt resting comfortably. VSS. Airway intact. SBAR complete to RN. All questions answered.   

## 2022-02-23 NOTE — Progress Notes (Signed)
Exhalation palpated and chest rise noted during procedure. Intermittent ETCO2 tracing noted. VSS.

## 2022-02-23 NOTE — Progress Notes (Signed)
Pt's states she is not having dysphagia just the intermittent chest discomfort.

## 2022-02-23 NOTE — Progress Notes (Signed)
See 04/09/2021 H&P, no changes.  °

## 2022-02-23 NOTE — Op Note (Signed)
[facility] Patient Name: Trenita Hulme Procedure Date: 02/23/2022 10:03 AM MRN: [id number] Endoscopist: Ladene Artist , MD, [id number] Age: 81 Referring MD:  Date of Birth: Apr 10, 1940 Gender: Female Account #: [id number] Procedure:                Upper GI endoscopy Indications:              Gastroesophageal reflux disease, Unexplained chest                            pain Medicines:                Monitored Anesthesia Care Procedure:                Pre-Anesthesia Assessment:                           - Prior to the procedure, a History and Physical                            was performed, and patient medications and                            allergies were reviewed. The patient's tolerance of                            previous anesthesia was also reviewed. The risks                            and benefits of the procedure and the sedation                            options and risks were discussed with the patient.                            All questions were answered, and informed consent                            was obtained. Prior Anticoagulants: The patient has                            taken no anticoagulant or antiplatelet agents. ASA                            Grade Assessment: II - A patient with mild systemic                            disease. After reviewing the risks and benefits,                            the patient was deemed in satisfactory condition to                            undergo the procedure.  After obtaining informed consent, the endoscope was                            passed under direct vision. Throughout the                            procedure, the patient's blood pressure, pulse, and                            oxygen saturations were monitored continuously. The                            GIF [id number] #[id number] was introduced through the                            mouth, and advanced to the second part of  duodenum.                            The upper GI endoscopy was accomplished without                            difficulty. The patient tolerated the procedure                            well. Scope In: Scope Out: Findings:                 The examined esophagus was normal. Biopsies were                            taken with a cold forceps for histology.                           One non-bleeding superficial gastric ulcer with a                            clean ulcer base (Forrest Class III) was found in                            the prepyloric region of the stomach. The lesion                            was 7 mm in largest dimension. Biopsies were taken                            with a cold forceps for histology.                           A single localized small erosion with no bleeding                            and no stigmata of recent bleeding was found in the  gastric antrum. Biopsies were taken with a cold                            forceps for histology.                           The exam of the stomach was otherwise normal.                           A medium non-bleeding diverticulum was found in the                            area of the papilla.                           The exam of the duodenum was otherwise normal. Complications:            No immediate complications. Estimated Blood Loss:     Estimated blood loss was minimal. Impression:               - Normal esophagus. Biopsied.                           - Non-bleeding gastric ulcer with a clean ulcer                            base (Forrest Class III). Biopsied.                           - Erosive gastropathy with no bleeding and no                            stigmata of recent bleeding. Biopsied.                           - Non-bleeding duodenal diverticulum. Recommendation:           - Patient has a contact number available for                            emergencies. The signs and symptoms  of potential                            delayed complications were discussed with the                            patient. Return to normal activities tomorrow.                            Written discharge instructions were provided to the                            patient.                           - Resume previous diet.                           -  Continue present medications.                           - Pantoprazole 40 mg po qd, 1 year of refills.                           - Follow antireflux measures.                           - Avoid or at least minimize NSAID use.                           - Await pathology results. Ladene Artist, MD 02/23/2022 10:21:13 AM This report has been signed electronically.

## 2022-02-23 NOTE — Progress Notes (Signed)
Called to room to assist during endoscopic procedure.  Patient ID and intended procedure confirmed with present staff. Received instructions for my participation in the procedure from the performing physician.  

## 2022-02-23 NOTE — Patient Instructions (Signed)
Handout on gastritis given. Resume previous diet.  Continue present  medications. Pantoprazole '40mg'$  daily.  Follow antireflux measures. Avoid or minimize Non-steroidal anti-inflammatory drug use.     YOU HAD AN ENDOSCOPIC PROCEDURE TODAY AT [facility]:   Refer to the procedure report that was given to you for any specific questions about what was found during the examination.  If the procedure report does not answer your questions, please call your gastroenterologist to clarify.  If you requested that your care partner not be given the details of your procedure findings, then the procedure report has been included in a sealed envelope for you to review at your convenience later.  YOU SHOULD EXPECT: Some feelings of bloating in the abdomen. Passage of more gas than usual.  Walking can help get rid of the air that was put into your GI tract during the procedure and reduce the bloating. If you had a lower endoscopy (such as a colonoscopy or flexible sigmoidoscopy) you may notice spotting of blood in your stool or on the toilet paper. If you underwent a bowel prep for your procedure, you may not have a normal bowel movement for a few days.  Please Note:  You might notice some irritation and congestion in your nose or some drainage.  This is from the oxygen used during your procedure.  There is no need for concern and it should clear up in a day or so.  SYMPTOMS TO REPORT IMMEDIATELY:   Following upper endoscopy (EGD)  Vomiting of blood or coffee ground material  New chest pain or pain under the shoulder blades  Painful or persistently difficult swallowing  New shortness of breath  Fever of 100F or higher  Black, tarry-looking stools  For urgent or emergent issues, a gastroenterologist can be reached at any hour by calling [phone]. Do not use MyChart messaging for urgent concerns.    DIET:  We do recommend a small meal at first, but then you may proceed to your regular  diet.  Drink plenty of fluids but you should avoid alcoholic beverages for 24 hours.  ACTIVITY:  You should plan to take it easy for the rest of today and you should NOT DRIVE or use heavy machinery until tomorrow (because of the sedation medicines used during the test).    FOLLOW UP: Our staff will call the number listed on your records the next business day following your procedure.  We will call around 7:15- 8:00 am to check on you and address any questions or concerns that you may have regarding the information given to you following your procedure. If we do not reach you, we will leave a message.     If any biopsies were taken you will be contacted by phone or by letter within the next 1-3 weeks.  Please call us at [phone] if you have not heard about the biopsies in 3 weeks.    SIGNATURES/CONFIDENTIALITY: You and/or your care partner have signed paperwork which will be entered into your electronic medical record.  These signatures attest to the fact that that the information above on your After Visit Summary has been reviewed and is understood.  Full responsibility of the confidentiality of this discharge information lies with you and/or your care-partner.

## 2022-02-24 ENCOUNTER — Telehealth: Payer: Self-pay | Admitting: *Deleted

## 2022-02-24 NOTE — Telephone Encounter (Signed)
  Follow up Call-     02/23/2022    9:25 AM  Call back number  Post procedure Call Back phone  # [phone]  Permission to leave phone message Yes     Patient questions:  Do you have a fever, pain , or abdominal swelling? No. Pain Score  0 *  Have you tolerated food without any problems? Yes.    Have you been able to return to your normal activities? Yes.    Do you have any questions about your discharge instructions: Diet   No. Medications  No. Follow up visit  No.  Do you have questions or concerns about your Care? No.  Actions: * If pain score is 4 or above: No action needed, pain <4.

## 2022-03-05 ENCOUNTER — Other Ambulatory Visit: Payer: Self-pay | Admitting: Internal Medicine

## 2022-03-13 ENCOUNTER — Telehealth: Payer: Self-pay | Admitting: Gastroenterology

## 2022-03-13 ENCOUNTER — Other Ambulatory Visit: Payer: Self-pay

## 2022-03-13 DIAGNOSIS — A048 Other specified bacterial intestinal infections: Secondary | ICD-10-CM

## 2022-03-13 MED ORDER — DOXYCYCLINE HYCLATE 100 MG PO CAPS: 100.0000 mg | ORAL_CAPSULE | Freq: Two times a day (BID) | ORAL | 0 refills | Status: AC

## 2022-03-13 MED ORDER — BISMUTH SUBSALICYLATE 262 MG PO CHEW: 262.0000 mg | CHEWABLE_TABLET | Freq: Four times a day (QID) | ORAL | 0 refills | Status: AC

## 2022-03-13 MED ORDER — METRONIDAZOLE 250 MG PO TABS: 250.0000 mg | ORAL_TABLET | Freq: Four times a day (QID) | ORAL | 0 refills | Status: AC

## 2022-03-13 NOTE — Telephone Encounter (Signed)
Patient is returning call for lab report. Please advise.

## 2022-03-13 NOTE — Telephone Encounter (Signed)
Refer to result note 02/23/22.

## 2022-03-14 IMAGING — MG DIGITAL SCREENING BILAT W/ TOMO W/ CAD
6 of 10 series · 6 of 30 positions shown · non-contrast
Comparison: Previous exam(s).

CLINICAL DATA: Screening.

EXAM:
DIGITAL SCREENING BILATERAL MAMMOGRAM WITH TOMO AND CAD

[R CC synth-2D]
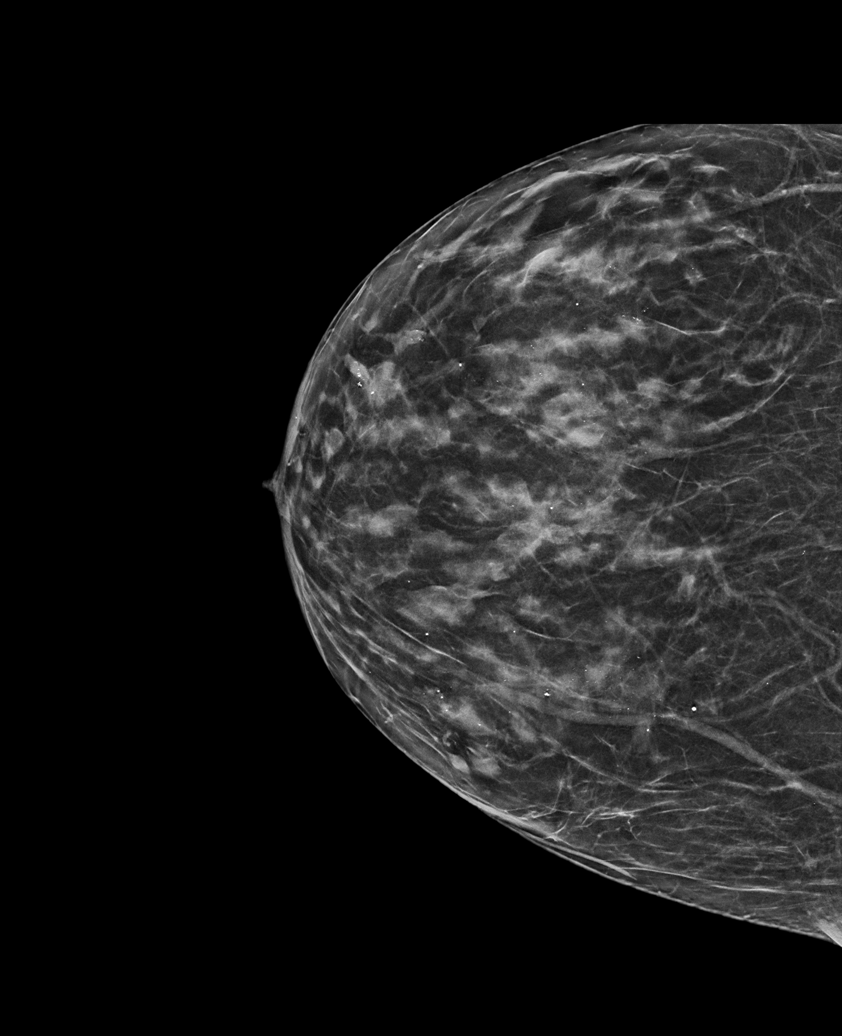

[L MLO synth-2D (1 of 2)]
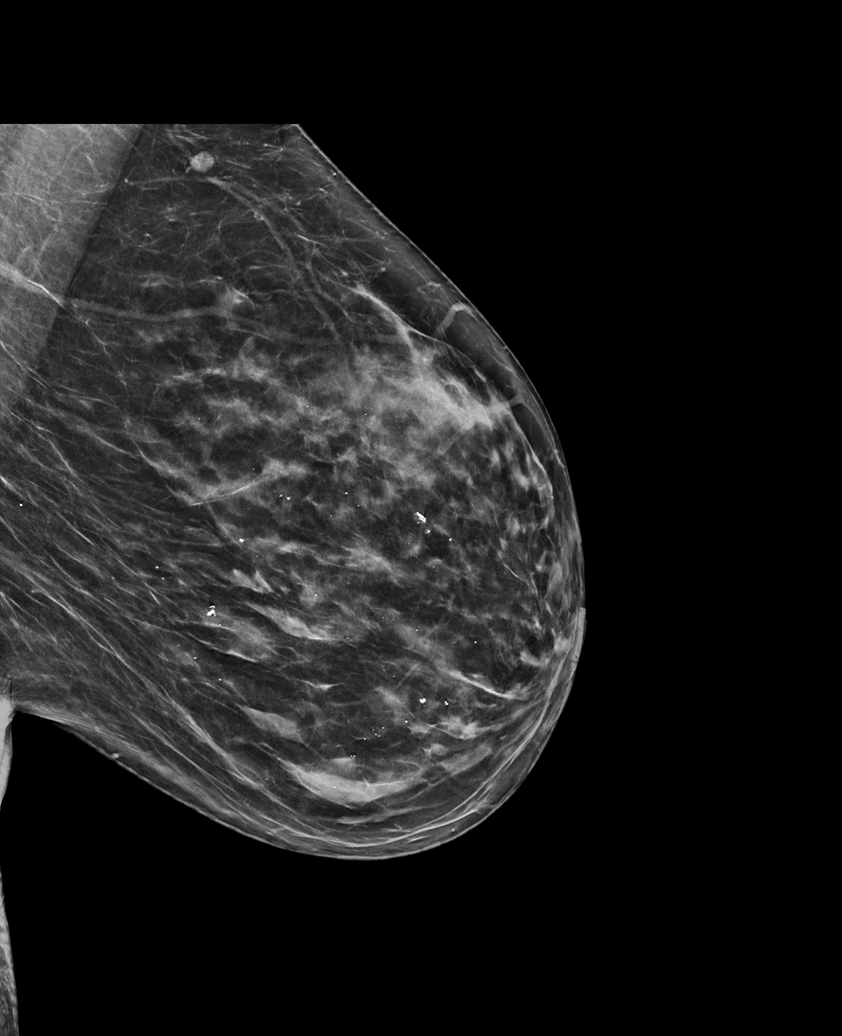

[L MLO synth-2D (2 of 2)]
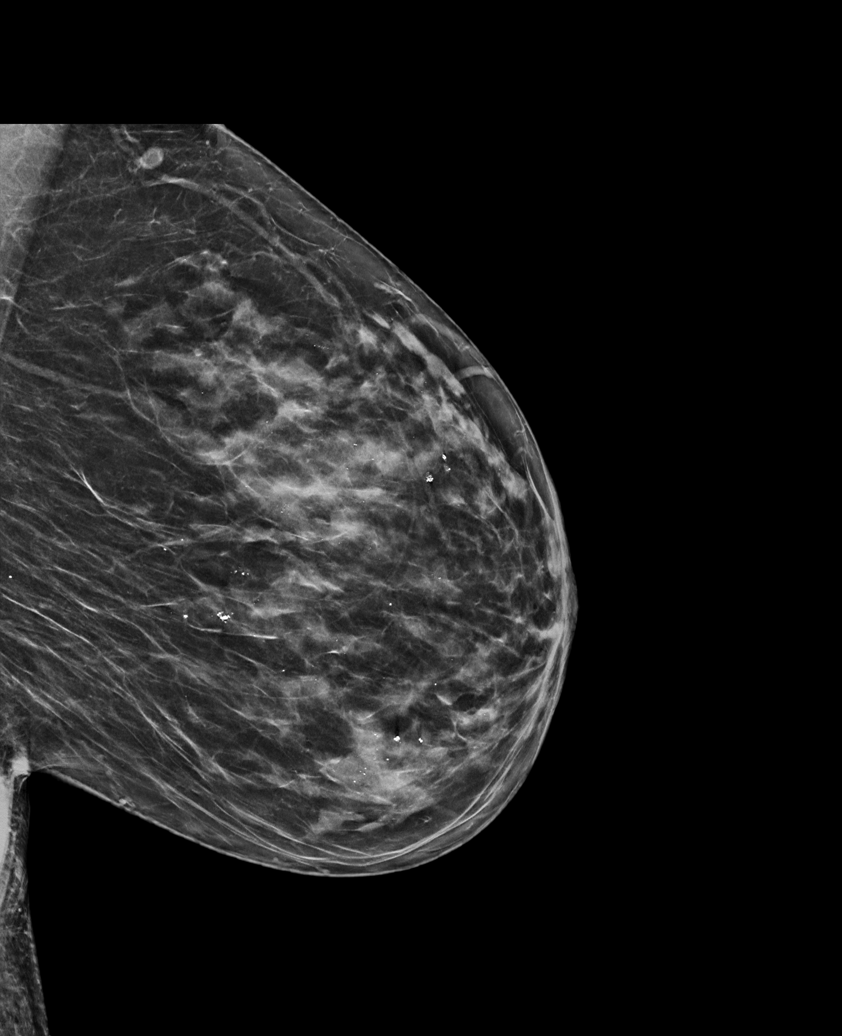

[R MLO synth-2D]
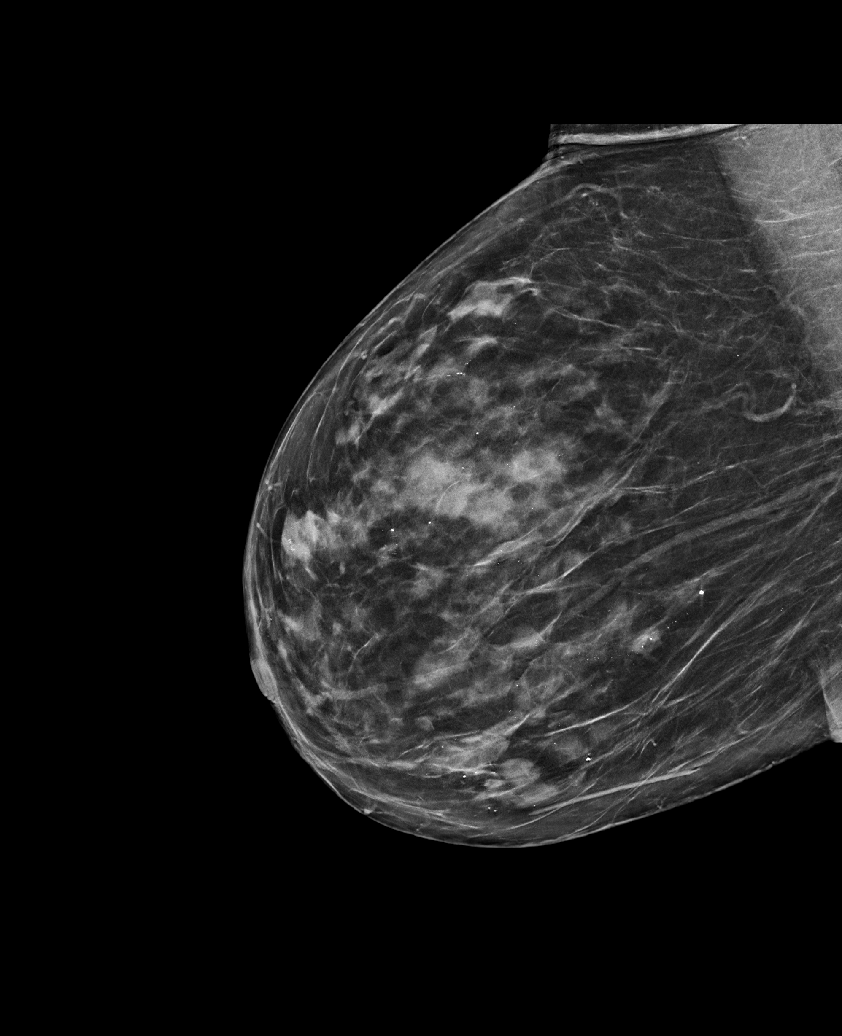

[L CC synth-2D]
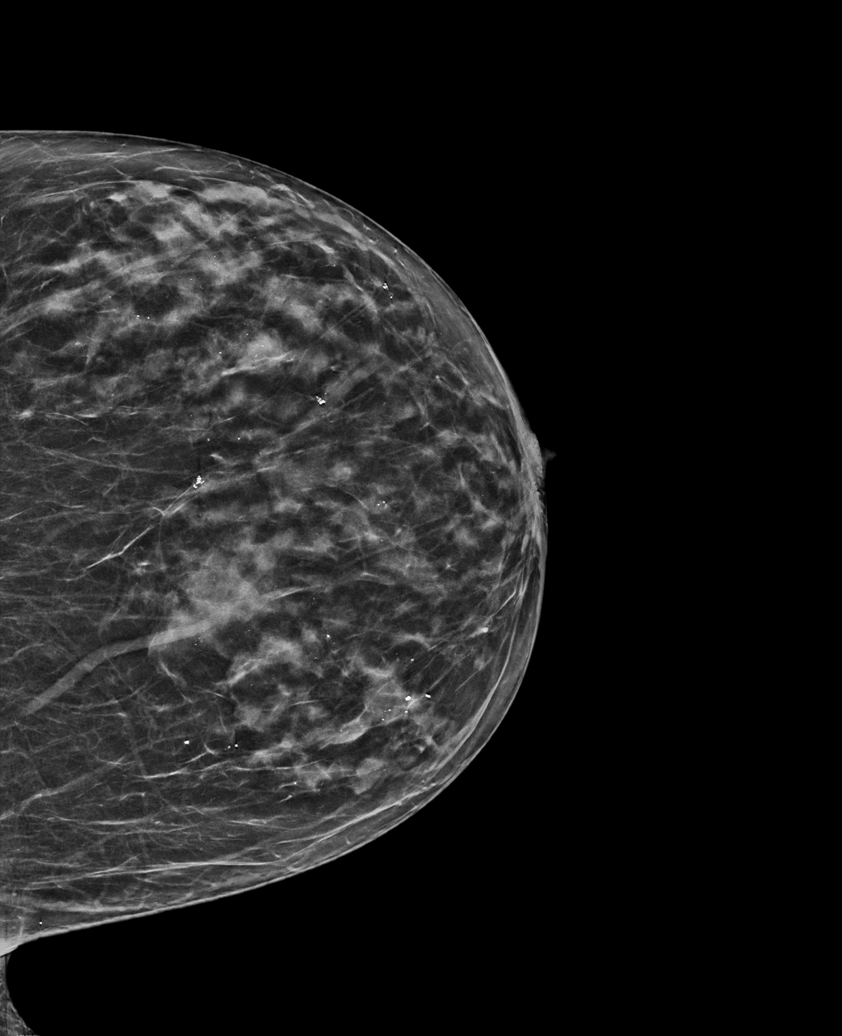

[L CC tomo · tomo slice 31/61.0]
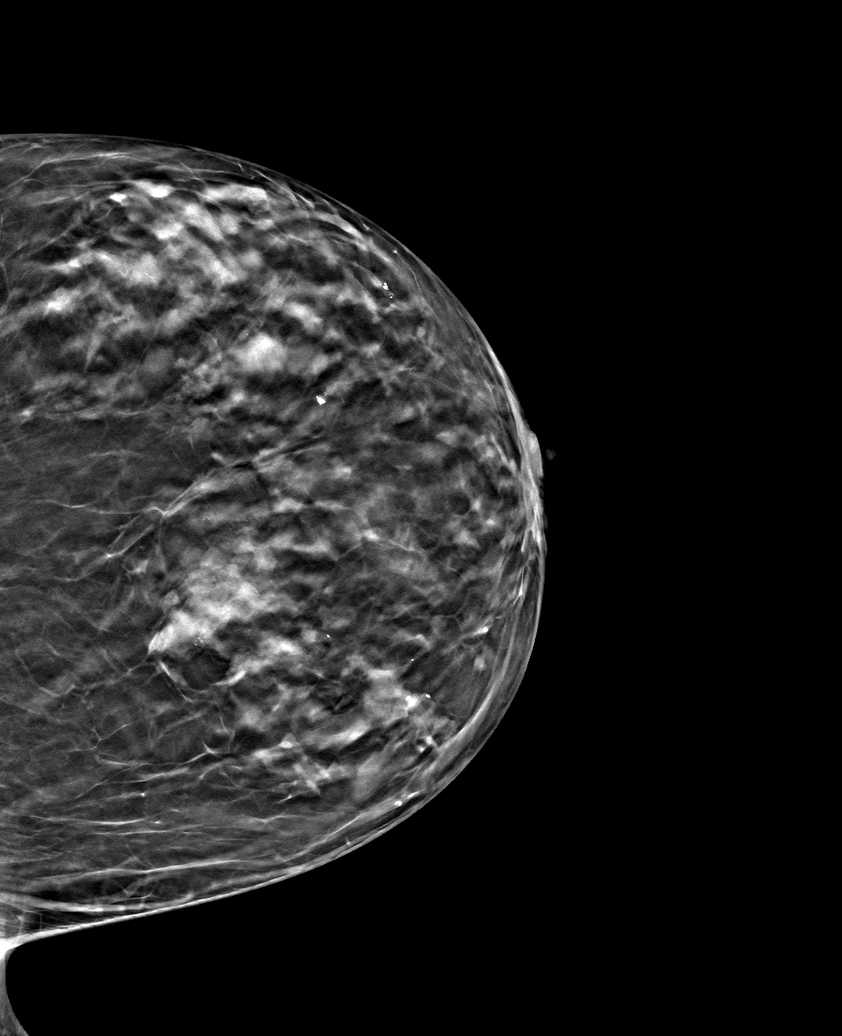

[6 of 30 positions shown; findings below may reference images not displayed]

ACR Breast Density Category c: The breast tissue is heterogeneously
dense, which may obscure small masses.
FINDINGS: There are no findings suspicious for malignancy. Images were
processed with CAD.
IMPRESSION: No mammographic evidence of malignancy. A result letter of this
screening mammogram will be mailed directly to the patient.

RECOMMENDATION:
Screening mammogram in one year. (Code:FT-U-LHB)

BI-RADS CATEGORY  1: Negative.

## 2022-03-16 ENCOUNTER — Telehealth: Payer: Self-pay | Admitting: Internal Medicine

## 2022-03-16 NOTE — Telephone Encounter (Signed)
Pt has been informed that she received Pneumovax23 on 11/15/2018.

## 2022-03-16 NOTE — Telephone Encounter (Signed)
Patient called and said she needs to know what kind of Pneumonia shot she got last year because she is going to be getting the second [facility] at [facility]. Call back is [phone].  She is scheduled at 11:30 this morning at [facility]

## 2022-04-24 ENCOUNTER — Telehealth: Payer: Self-pay

## 2022-04-24 NOTE — Telephone Encounter (Signed)
Left message on machine to call back  

## 2022-04-24 NOTE — Telephone Encounter (Signed)
The pt has been advised and will come in at her convenience for stool test after stopping pantoprazole

## 2022-04-24 NOTE — Telephone Encounter (Signed)
-----  Message from Algernon Huxley, RN sent at 04/24/2022  9:57 AM EST ----- Fuller Plan pt  ----- Message ----- From: Carl Best, RN Sent: 04/24/2022   8:00 AM EST To: Carl Best, RN  Patient needs reminder to come in to turn in stool sample & come off pantoprazole two weeks prior. Order has already been placed for stool kit.

## 2022-05-07 ENCOUNTER — Other Ambulatory Visit: Payer: Self-pay | Admitting: Internal Medicine

## 2022-05-07 ENCOUNTER — Telehealth: Payer: Self-pay | Admitting: Internal Medicine

## 2022-05-07 DIAGNOSIS — G8929 Other chronic pain: Secondary | ICD-10-CM

## 2022-05-07 MED ORDER — ESZOPICLONE 2 MG PO TABS: 2.0000 mg | ORAL_TABLET | Freq: Every evening | ORAL | 0 refills | Status: DC | PRN

## 2022-05-07 NOTE — Telephone Encounter (Signed)
Caller & Relationship to patient: Jaydan - self  Call back number: [phone]  Date of last office visit: 09-15-21  Date of next office visit: 05-11-22  Medication(s) to be refilled:  eszopiclone (LUNESTA) 2 MG TABS tablet   Preferred Pharmacy: [facility] [place], [place] - [address] [facility]

## 2022-05-11 ENCOUNTER — Ambulatory Visit (INDEPENDENT_AMBULATORY_CARE_PROVIDER_SITE_OTHER): Payer: Medicare PPO

## 2022-05-11 ENCOUNTER — Ambulatory Visit (INDEPENDENT_AMBULATORY_CARE_PROVIDER_SITE_OTHER): Payer: Medicare PPO | Admitting: Internal Medicine

## 2022-05-11 ENCOUNTER — Encounter: Payer: Self-pay | Admitting: Internal Medicine

## 2022-05-11 VITALS — BP 136/76 | HR 82 | Temp 98.1°F | Resp 16 | Ht 66.0 in | Wt 174.0 lb

## 2022-05-11 DIAGNOSIS — D508 Other iron deficiency anemias: Secondary | ICD-10-CM | POA: Diagnosis not present

## 2022-05-11 DIAGNOSIS — E785 Hyperlipidemia, unspecified: Secondary | ICD-10-CM | POA: Diagnosis not present

## 2022-05-11 DIAGNOSIS — I10 Essential (primary) hypertension: Secondary | ICD-10-CM

## 2022-05-11 DIAGNOSIS — M159 Polyosteoarthritis, unspecified: Secondary | ICD-10-CM

## 2022-05-11 DIAGNOSIS — G8929 Other chronic pain: Secondary | ICD-10-CM | POA: Insufficient documentation

## 2022-05-11 DIAGNOSIS — M542 Cervicalgia: Secondary | ICD-10-CM | POA: Diagnosis not present

## 2022-05-11 DIAGNOSIS — M545 Low back pain, unspecified: Secondary | ICD-10-CM

## 2022-05-11 DIAGNOSIS — M4710 Other spondylosis with myelopathy, site unspecified: Secondary | ICD-10-CM | POA: Diagnosis not present

## 2022-05-11 DIAGNOSIS — E118 Type 2 diabetes mellitus with unspecified complications: Secondary | ICD-10-CM

## 2022-05-11 DIAGNOSIS — M4802 Spinal stenosis, cervical region: Secondary | ICD-10-CM

## 2022-05-11 DIAGNOSIS — N1831 Chronic kidney disease, stage 3a: Secondary | ICD-10-CM | POA: Diagnosis not present

## 2022-05-11 DIAGNOSIS — A048 Other specified bacterial intestinal infections: Secondary | ICD-10-CM

## 2022-05-11 DIAGNOSIS — E876 Hypokalemia: Secondary | ICD-10-CM | POA: Diagnosis not present

## 2022-05-11 DIAGNOSIS — M5136 Other intervertebral disc degeneration, lumbar region: Secondary | ICD-10-CM

## 2022-05-11 DIAGNOSIS — M5030 Other cervical disc degeneration, unspecified cervical region: Secondary | ICD-10-CM

## 2022-05-11 LAB — CBC WITH DIFFERENTIAL/PLATELET
Basophils Absolute: 0 10*3/uL (ref 0.0–0.1)
Basophils Relative: 0.8 % (ref 0.0–3.0)
Eosinophils Absolute: 0.2 10*3/uL (ref 0.0–0.7)
Eosinophils Relative: 4.6 % (ref 0.0–5.0)
HCT: 36.3 % (ref 36.0–46.0)
Hemoglobin: 12 g/dL (ref 12.0–15.0)
Lymphocytes Relative: 30.2 % (ref 12.0–46.0)
Lymphs Abs: 1.5 10*3/uL (ref 0.7–4.0)
MCHC: 33 g/dL (ref 30.0–36.0)
MCV: 88.9 fl (ref 78.0–100.0)
Monocytes Absolute: 0.4 10*3/uL (ref 0.1–1.0)
Monocytes Relative: 7.3 % (ref 3.0–12.0)
Neutro Abs: 2.9 10*3/uL (ref 1.4–7.7)
Neutrophils Relative %: 57.1 % (ref 43.0–77.0)
Platelets: 251 10*3/uL (ref 150.0–400.0)
RBC: 4.08 Mil/uL (ref 3.87–5.11)
RDW: 15.1 % (ref 11.5–15.5)
WBC: 5 10*3/uL (ref 4.0–10.5)

## 2022-05-11 MED ORDER — INDAPAMIDE 1.25 MG PO TABS: 1.2500 mg | ORAL_TABLET | Freq: Every day | ORAL | 0 refills | Status: DC

## 2022-05-11 MED ORDER — POTASSIUM CHLORIDE CRYS ER 20 MEQ PO TBCR: 20.0000 meq | EXTENDED_RELEASE_TABLET | Freq: Two times a day (BID) | ORAL | 0 refills | Status: DC

## 2022-05-11 MED ORDER — ROSUVASTATIN CALCIUM 20 MG PO TABS: 20.0000 mg | ORAL_TABLET | Freq: Every day | ORAL | 1 refills | Status: DC

## 2022-05-11 MED ORDER — ACCU-CHEK GUIDE VI STRP: 1.0000 | ORAL_STRIP | Freq: Two times a day (BID) | 5 refills | Status: DC

## 2022-05-11 MED ORDER — FUSION PLUS PO CAPS: 1.0000 | ORAL_CAPSULE | Freq: Every day | ORAL | 2 refills | Status: DC

## 2022-05-11 MED ORDER — CYCLOBENZAPRINE HCL 5 MG PO TABS: 5.0000 mg | ORAL_TABLET | Freq: Three times a day (TID) | ORAL | 0 refills | Status: DC | PRN

## 2022-05-11 MED ORDER — NABUMETONE 500 MG PO TABS: 500.0000 mg | ORAL_TABLET | Freq: Two times a day (BID) | ORAL | 0 refills | Status: DC | PRN

## 2022-05-11 MED ORDER — LOSARTAN POTASSIUM 100 MG PO TABS: 100.0000 mg | ORAL_TABLET | Freq: Every day | ORAL | 0 refills | Status: DC

## 2022-05-11 NOTE — Progress Notes (Signed)
Subjective:  Patient ID: Alexandria [name], female    DOB: 11-Aug-1940  Age: 82 y.o. MRN: [id number]  CC: Anemia, Diabetes, Hyperlipidemia, Back Pain, and Osteoarthritis   HPI [name] presents for f/up ----  She complains of a 30-month history of posterior neck pain that she describes as a stinging and burning sensation.  The pain does not radiate into her extremities and she denies paresthesias.  She is active and denies chest pain, shortness of breath, diaphoresis, or edema.  Outpatient Medications Prior to Visit  Medication Sig Dispense Refill   Bimatoprost (LUMIGAN OP) Apply to eye.     Blood Glucose Calibration (ACCU-CHEK GUIDE CONTROL) LIQD 1 Act by In Vitro route 2 (two) times daily. 1 each 2   Blood Glucose Monitoring Suppl (ACCU-CHEK GUIDE ME) w/Device KIT 1 Act by Does not apply route 2 (two) times daily. 2 kit 0   eszopiclone (LUNESTA) 2 MG TABS tablet Take 1 tablet (2 mg total) by mouth at bedtime as needed for sleep. Take immediately before bedtime 90 tablet 0   fluocinonide cream (LIDEX) 0.05 % Apply 1 application. topically 2 (two) times daily. 60 g 1   lidocaine (LIDODERM) 5 % Place 1 patch onto the skin daily. Remove & Discard patch within 12 hours or as directed by MD 90 patch 1   pantoprazole (PROTONIX) 40 MG tablet Take 1 tablet (40 mg total) by mouth daily. 90 tablet 3   Turmeric 500 MG CAPS Take 1 capsule by mouth daily. 90 capsule 1   XIIDRA 5 % SOLN Apply 1 drop to eye 2 (two) times daily.     cyclobenzaprine (FLEXERIL) 5 MG tablet Take 1 tablet (5 mg total) by mouth 3 (three) times daily as needed for muscle spasms. 270 tablet 0   glucose blood (ACCU-CHEK GUIDE) test strip 1 each by Other route 2 (two) times daily. Use as instructed 100 each 5   indapamide (LOZOL) 1.25 MG tablet Take 1 tablet (1.25 mg total) by mouth daily. 90 tablet 0   Iron-FA-B Cmp-C-Biot-Probiotic (FUSION PLUS) CAPS TAKE 1 CAPSULE BY MOUTH EVERY DAY 30 capsule 2   losartan (COZAAR) 100 MG  tablet TAKE ONE TABLET BY MOUTH EVERY DAY 90 tablet 0   nabumetone (RELAFEN) 500 MG tablet Take 1 tablet (500 mg total) by mouth 2 (two) times daily as needed. 180 tablet 0   potassium chloride SA (KLOR-CON M) 20 MEQ tablet Take 1 tablet (20 mEq total) by mouth 2 (two) times daily. 180 tablet 0   rosuvastatin (CRESTOR) 20 MG tablet Take 1 tablet (20 mg total) by mouth daily. 90 tablet 1   No facility-administered medications prior to visit.    ROS Review of Systems  Constitutional: Negative.  Negative for diaphoresis, fatigue and unexpected weight change.  HENT: Negative.    Eyes: Negative.   Respiratory:  Negative for cough, chest tightness, shortness of breath and wheezing.   Cardiovascular:  Negative for chest pain, palpitations and leg swelling.  Gastrointestinal:  Negative for abdominal pain, constipation, diarrhea, nausea and vomiting.  Endocrine: Negative.   Genitourinary: Negative.  Negative for difficulty urinating.  Musculoskeletal:  Positive for arthralgias, back pain and neck pain. Negative for joint swelling and myalgias.  Skin: Negative.   Neurological:  Negative for dizziness, weakness and light-headedness.  Hematological:  Negative for adenopathy. Does not bruise/bleed easily.  Psychiatric/Behavioral: Negative.      Objective:  BP 136/76 (BP Location: Left Arm, Patient Position: Sitting, Cuff Size: Large)  Pulse 82   Temp 98.1 F (36.7 C) (Oral)   Resp 16   Ht 5\' 6"  (1.676 m)   Wt 174 lb (78.9 kg)   SpO2 98%   BMI 28.08 kg/m   BP Readings from Last 3 Encounters:  05/11/22 136/76  02/23/22 (!) 133/55  01/28/22 126/70    Wt Readings from Last 3 Encounters:  05/11/22 174 lb (78.9 kg)  02/23/22 171 lb (77.6 kg)  01/28/22 171 lb 12.8 oz (77.9 kg)    Physical Exam Vitals reviewed.  HENT:     Mouth/Throat:     Mouth: Mucous membranes are moist.  Eyes:     General: No scleral icterus.    Conjunctiva/sclera: Conjunctivae normal.  Cardiovascular:      Rate and Rhythm: Normal rate and regular rhythm.     Heart sounds: No murmur heard. Pulmonary:     Effort: Pulmonary effort is normal.     Breath sounds: No stridor. No wheezing, rhonchi or rales.  Abdominal:     General: Abdomen is flat.     Palpations: There is no mass.     Tenderness: There is no abdominal tenderness. There is no guarding.     Hernia: No hernia is present.  Musculoskeletal:     Cervical back: Neck supple. No swelling, erythema or bony tenderness. No pain with movement. Decreased range of motion.     Right lower leg: No edema.     Left lower leg: No edema.  Lymphadenopathy:     Cervical: No cervical adenopathy.  Skin:    General: Skin is warm and dry.  Neurological:     General: No focal deficit present.     Mental Status: She is alert.     Cranial Nerves: Cranial nerves 2-12 are intact.     Sensory: Sensation is intact.     Motor: Motor function is intact.     Coordination: Coordination is intact.     Deep Tendon Reflexes: Reflexes normal.     Reflex Scores:      Tricep reflexes are 0 on the right side and 0 on the left side.      Bicep reflexes are 1+ on the right side and 1+ on the left side.      Brachioradialis reflexes are 1+ on the right side and 1+ on the left side.      Patellar reflexes are 0 on the right side and 0 on the left side.      Achilles reflexes are 0 on the left side. Psychiatric:        Mood and Affect: Mood normal.        Behavior: Behavior normal.     Lab Results  Component Value Date   WBC 5.0 05/11/2022   HGB 12.0 05/11/2022   HCT 36.3 05/11/2022   PLT 251.0 05/11/2022   GLUCOSE 87 05/11/2022   CHOL 145 09/15/2021   TRIG 76.0 09/15/2021   HDL 63.00 09/15/2021   LDLCALC 67 09/15/2021   ALT 14 05/11/2022   AST 17 05/11/2022   NA 140 05/11/2022   K 4.3 05/11/2022   CL 104 05/11/2022   CREATININE 0.96 05/11/2022   BUN 17 05/11/2022   CO2 28 05/11/2022   TSH 1.48 05/11/2022   HGBA1C 6.1 05/11/2022   MICROALBUR <0.7  09/15/2021    MM 3D SCREEN BREAST BILATERAL  Result Date: 01/29/2022 CLINICAL DATA:  Screening. EXAM: DIGITAL SCREENING BILATERAL MAMMOGRAM WITH TOMOSYNTHESIS AND CAD TECHNIQUE: Bilateral screening digital craniocaudal and  mediolateral oblique mammograms were obtained. Bilateral screening digital breast tomosynthesis was performed. The images were evaluated with computer-aided detection. COMPARISON:  Previous exam(s). ACR Breast Density Category c: The breast tissue is heterogeneously dense, which may obscure small masses. FINDINGS: There are no findings suspicious for malignancy. IMPRESSION: No mammographic evidence of malignancy. A result letter of this screening mammogram will be mailed directly to the patient. RECOMMENDATION: Screening mammogram in one year. (Code:[id number]) BI-RADS CATEGORY  1: Negative. Electronically Signed   By: Bary Richard M.D.   On: 01/29/2022 14:41   DG Cervical Spine Complete  Result Date: 05/13/2022 CLINICAL DATA:  Neck pain.  No known injury. EXAM: CERVICAL SPINE - COMPLETE 4+ VIEW COMPARISON:  No recent prior. FINDINGS: Diffuse severe multilevel disc degeneration and endplate osteophyte formation. Diffuse facet hypertrophy. Multilevel mild neural foraminal narrowing. No evidence of fracture or dislocation. Pulmonary apices are clear. IMPRESSION: Diffuse severe multilevel degenerative change. No acute bony abnormality identified. Multilevel mild neural foraminal narrowing. Electronically Signed   By: Maisie Fus  Register M.D.   On: 05/13/2022 08:22      Assessment & Plan:   Thereasa was seen today for anemia, diabetes, hyperlipidemia, back pain and osteoarthritis.  Diagnoses and all orders for this visit:  Essential hypertension- Her blood pressure is well-controlled. -     Basic metabolic panel; Future -     CBC with Differential/Platelet; Future -     Hepatic function panel; Future -     TSH; Future -     indapamide (LOZOL) 1.25 MG tablet; Take 1 tablet (1.25 mg  total) by mouth daily. -     losartan (COZAAR) 100 MG tablet; Take 1 tablet (100 mg total) by mouth daily. -     potassium chloride SA (KLOR-CON M) 20 MEQ tablet; Take 1 tablet (20 mEq total) by mouth 2 (two) times daily. -     TSH -     Hepatic function panel -     CBC with Differential/Platelet -     Basic metabolic panel  Type II diabetes mellitus with manifestations (HCC)- Her blood sugar is well-controlled. -     Hemoglobin A1c; Future -     glucose blood (ACCU-CHEK GUIDE) test strip; 1 each by Other route 2 (two) times daily. Use as instructed -     losartan (COZAAR) 100 MG tablet; Take 1 tablet (100 mg total) by mouth daily. -     Hemoglobin A1c  Stage 3a chronic kidney disease (HCC)- Her renal function is stable. -     Basic metabolic panel; Future -     Basic metabolic panel  Iron deficiency anemia secondary to inadequate dietary iron intake-she is no longer anemic but her iron level is low.  Will upgrade to a more effective iron supplement. -     IBC + Ferritin; Future -     CBC with Differential/Platelet; Future -     CBC with Differential/Platelet -     IBC + Ferritin  Hyperlipidemia with target LDL less than 100- LDL goal achieved. Doing well on the statin  -     Hepatic function panel; Future -     TSH; Future -     rosuvastatin (CRESTOR) 20 MG tablet; Take 1 tablet (20 mg total) by mouth daily. -     TSH -     Hepatic function panel  Degenerative disc disease, lumbar -     nabumetone (RELAFEN) 500 MG tablet; Take 1 tablet (500 mg total) by mouth 2 (  two) times daily as needed.  Primary osteoarthritis involving multiple joints -     nabumetone (RELAFEN) 500 MG tablet; Take 1 tablet (500 mg total) by mouth 2 (two) times daily as needed.  Osteoarthritis of spine with myelopathy, unspecified spinal region -     nabumetone (RELAFEN) 500 MG tablet; Take 1 tablet (500 mg total) by mouth 2 (two) times daily as needed.  Hypokalemia -     potassium chloride SA (KLOR-CON  M) 20 MEQ tablet; Take 1 tablet (20 mEq total) by mouth 2 (two) times daily.  Chronic bilateral low back pain without sciatica -     cyclobenzaprine (FLEXERIL) 5 MG tablet; Take 1 tablet (5 mg total) by mouth 3 (three) times daily as needed for muscle spasms.  Neck pain, chronic -     DG Cervical Spine Complete; Future -     Ambulatory referral to Physical Medicine Rehab  Neuroforaminal stenosis of cervical spine -     Ambulatory referral to Physical Medicine Rehab  DDD (degenerative disc disease), cervical -     Ambulatory referral to Physical Medicine Rehab  Other orders -     Discontinue: Iron-FA-B Cmp-C-Biot-Probiotic (FUSION PLUS) CAPS; Take 1 capsule by mouth daily.   I have discontinued Kary Kos. Vogler's Fusion Plus. I have also changed her losartan. Additionally, I am having her maintain her Bimatoprost (LUMIGAN OP), Turmeric, lidocaine, fluocinonide cream, Accu-Chek Guide Me, Accu-Chek Guide Control, Xiidra, pantoprazole, eszopiclone, Accu-Chek Guide, indapamide, nabumetone, potassium chloride SA, rosuvastatin, and cyclobenzaprine.  Meds ordered this encounter  Medications   glucose blood (ACCU-CHEK GUIDE) test strip    Sig: 1 each by Other route 2 (two) times daily. Use as instructed    Dispense:  100 each    Refill:  5   indapamide (LOZOL) 1.25 MG tablet    Sig: Take 1 tablet (1.25 mg total) by mouth daily.    Dispense:  90 tablet    Refill:  0   DISCONTD: Iron-FA-B Cmp-C-Biot-Probiotic (FUSION PLUS) CAPS    Sig: Take 1 capsule by mouth daily.    Dispense:  30 capsule    Refill:  2   losartan (COZAAR) 100 MG tablet    Sig: Take 1 tablet (100 mg total) by mouth daily.    Dispense:  90 tablet    Refill:  0   nabumetone (RELAFEN) 500 MG tablet    Sig: Take 1 tablet (500 mg total) by mouth 2 (two) times daily as needed.    Dispense:  180 tablet    Refill:  0   potassium chloride SA (KLOR-CON M) 20 MEQ tablet    Sig: Take 1 tablet (20 mEq total) by mouth 2 (two) times  daily.    Dispense:  180 tablet    Refill:  0   rosuvastatin (CRESTOR) 20 MG tablet    Sig: Take 1 tablet (20 mg total) by mouth daily.    Dispense:  90 tablet    Refill:  1   cyclobenzaprine (FLEXERIL) 5 MG tablet    Sig: Take 1 tablet (5 mg total) by mouth 3 (three) times daily as needed for muscle spasms.    Dispense:  270 tablet    Refill:  0     Follow-up: Return in about 6 months (around 11/09/2022).  Sanda Linger, MD

## 2022-05-11 NOTE — Patient Instructions (Signed)

## 2022-05-12 ENCOUNTER — Other Ambulatory Visit: Payer: Self-pay | Admitting: Internal Medicine

## 2022-05-12 ENCOUNTER — Telehealth: Payer: Self-pay | Admitting: Internal Medicine

## 2022-05-12 DIAGNOSIS — D508 Other iron deficiency anemias: Secondary | ICD-10-CM

## 2022-05-12 LAB — HEPATIC FUNCTION PANEL
ALT: 14 U/L (ref 0–35)
AST: 17 U/L (ref 0–37)
Albumin: 4.3 g/dL (ref 3.5–5.2)
Alkaline Phosphatase: 38 U/L — ABNORMAL LOW (ref 39–117)
Bilirubin, Direct: 0.1 mg/dL (ref 0.0–0.3)
Total Bilirubin: 0.3 mg/dL (ref 0.2–1.2)
Total Protein: 7.3 g/dL (ref 6.0–8.3)

## 2022-05-12 LAB — TSH: TSH: 1.48 u[IU]/mL (ref 0.35–5.50)

## 2022-05-12 LAB — BASIC METABOLIC PANEL
BUN: 17 mg/dL (ref 6–23)
CO2: 28 mEq/L (ref 19–32)
Calcium: 9.3 mg/dL (ref 8.4–10.5)
Chloride: 104 mEq/L (ref 96–112)
Creatinine, Ser: 0.96 mg/dL (ref 0.40–1.20)
GFR: 56.08 mL/min — ABNORMAL LOW (ref >60.00)
Glucose, Bld: 87 mg/dL (ref 70–99)
Potassium: 4.3 mEq/L (ref 3.5–5.1)
Sodium: 140 mEq/L (ref 135–145)

## 2022-05-12 LAB — IBC + FERRITIN
Ferritin: 359.6 ng/mL — ABNORMAL HIGH (ref 10.0–291.0)
Iron: 36 ug/dL — ABNORMAL LOW (ref 42–145)
Saturation Ratios: 10.9 % — ABNORMAL LOW (ref 20.0–50.0)
TIBC: 329 ug/dL (ref 250.0–450.0)
Transferrin: 235 mg/dL (ref 212.0–360.0)

## 2022-05-12 LAB — HEMOGLOBIN A1C: Hgb A1c MFr Bld: 6.1 % (ref 4.6–6.5)

## 2022-05-12 MED ORDER — ACCRUFER 30 MG PO CAPS: 1.0000 | ORAL_CAPSULE | Freq: Two times a day (BID) | ORAL | 1 refills | Status: DC

## 2022-05-12 NOTE — Telephone Encounter (Signed)
Patient called and said she spoke with PCP 05/12/22. They spoke about increasing her iron dosage and she decided she would like to do that. [id number] [place] so she could [place] ASAP [facility] [place] [address] [facility] [facility] for future refills because they can send it to her for a better price.

## 2022-05-13 DIAGNOSIS — M5030 Other cervical disc degeneration, unspecified cervical region: Secondary | ICD-10-CM | POA: Insufficient documentation

## 2022-05-13 DIAGNOSIS — M4802 Spinal stenosis, cervical region: Secondary | ICD-10-CM | POA: Insufficient documentation

## 2022-05-13 NOTE — Telephone Encounter (Signed)
Called pt, LVM.   

## 2022-05-22 ENCOUNTER — Other Ambulatory Visit: Payer: Self-pay | Admitting: Internal Medicine

## 2022-05-24 LAB — H. PYLORI ANTIGEN, STOOL: H pylori Ag, Stl: NEGATIVE

## 2022-05-25 ENCOUNTER — Encounter: Payer: Self-pay | Admitting: Physical Medicine and Rehabilitation

## 2022-06-11 DIAGNOSIS — H16223 Keratoconjunctivitis sicca, not specified as Sjogren's, bilateral: Secondary | ICD-10-CM | POA: Diagnosis not present

## 2022-06-11 DIAGNOSIS — E119 Type 2 diabetes mellitus without complications: Secondary | ICD-10-CM | POA: Diagnosis not present

## 2022-06-11 DIAGNOSIS — H401131 Primary open-angle glaucoma, bilateral, mild stage: Secondary | ICD-10-CM | POA: Diagnosis not present

## 2022-06-11 DIAGNOSIS — D3130 Benign neoplasm of unspecified choroid: Secondary | ICD-10-CM | POA: Diagnosis not present

## 2022-06-19 ENCOUNTER — Other Ambulatory Visit: Payer: Self-pay | Admitting: Internal Medicine

## 2022-06-19 DIAGNOSIS — D508 Other iron deficiency anemias: Secondary | ICD-10-CM

## 2022-06-19 MED ORDER — IRON (FERROUS SULFATE) 325 (65 FE) MG PO TABS: 325.0000 mg | ORAL_TABLET | Freq: Every day | ORAL | 0 refills | Status: DC

## 2022-06-19 NOTE — Telephone Encounter (Signed)
Patient called and said  Ferric Maltol (ACCRUFER) 30 MG CAPS is not covered by her insurance and she can't afford it. She would like to know if there is anything else that can be prescribed. Best callback is [phone].

## 2022-06-21 IMAGING — DX DG LUMBAR SPINE 2-3V
3 series · 3 of 3 positions shown · non-contrast
Comparison: None.

CLINICAL DATA: Pain.

EXAM:
LUMBAR SPINE - 2-3 VIEW; PELVIS - 1-2 VIEW

[l-spine ap]
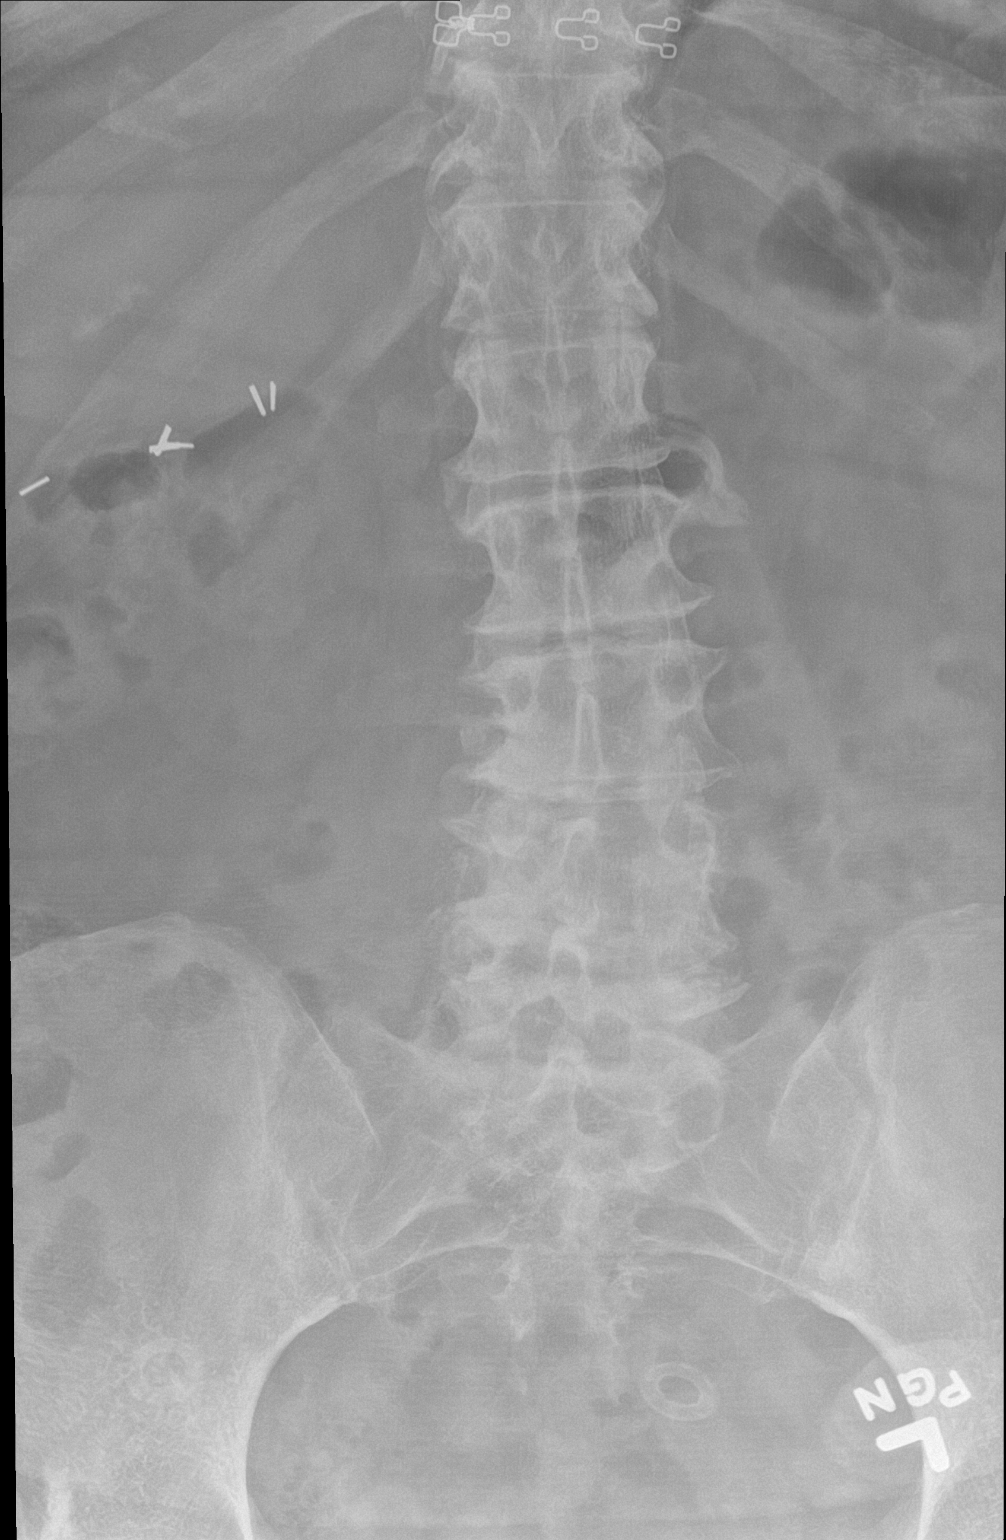

[l-spine lateral (1 of 2)]
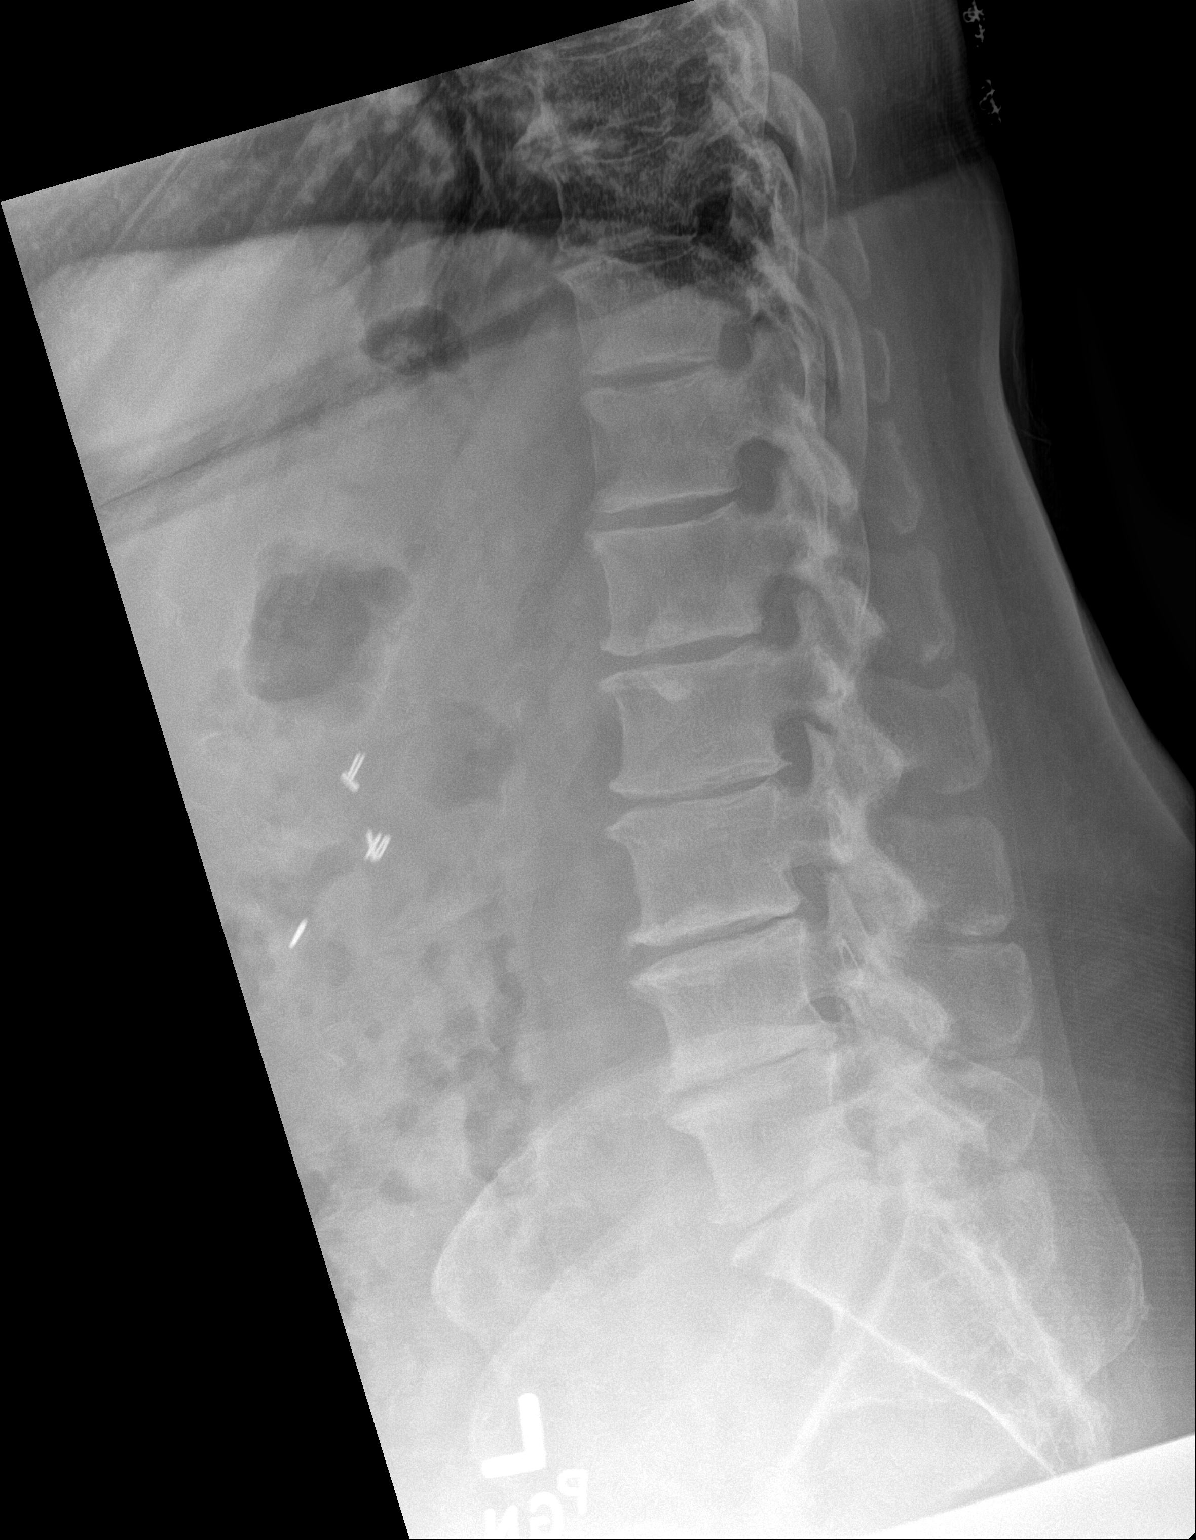

[l-spine lateral (2 of 2)]
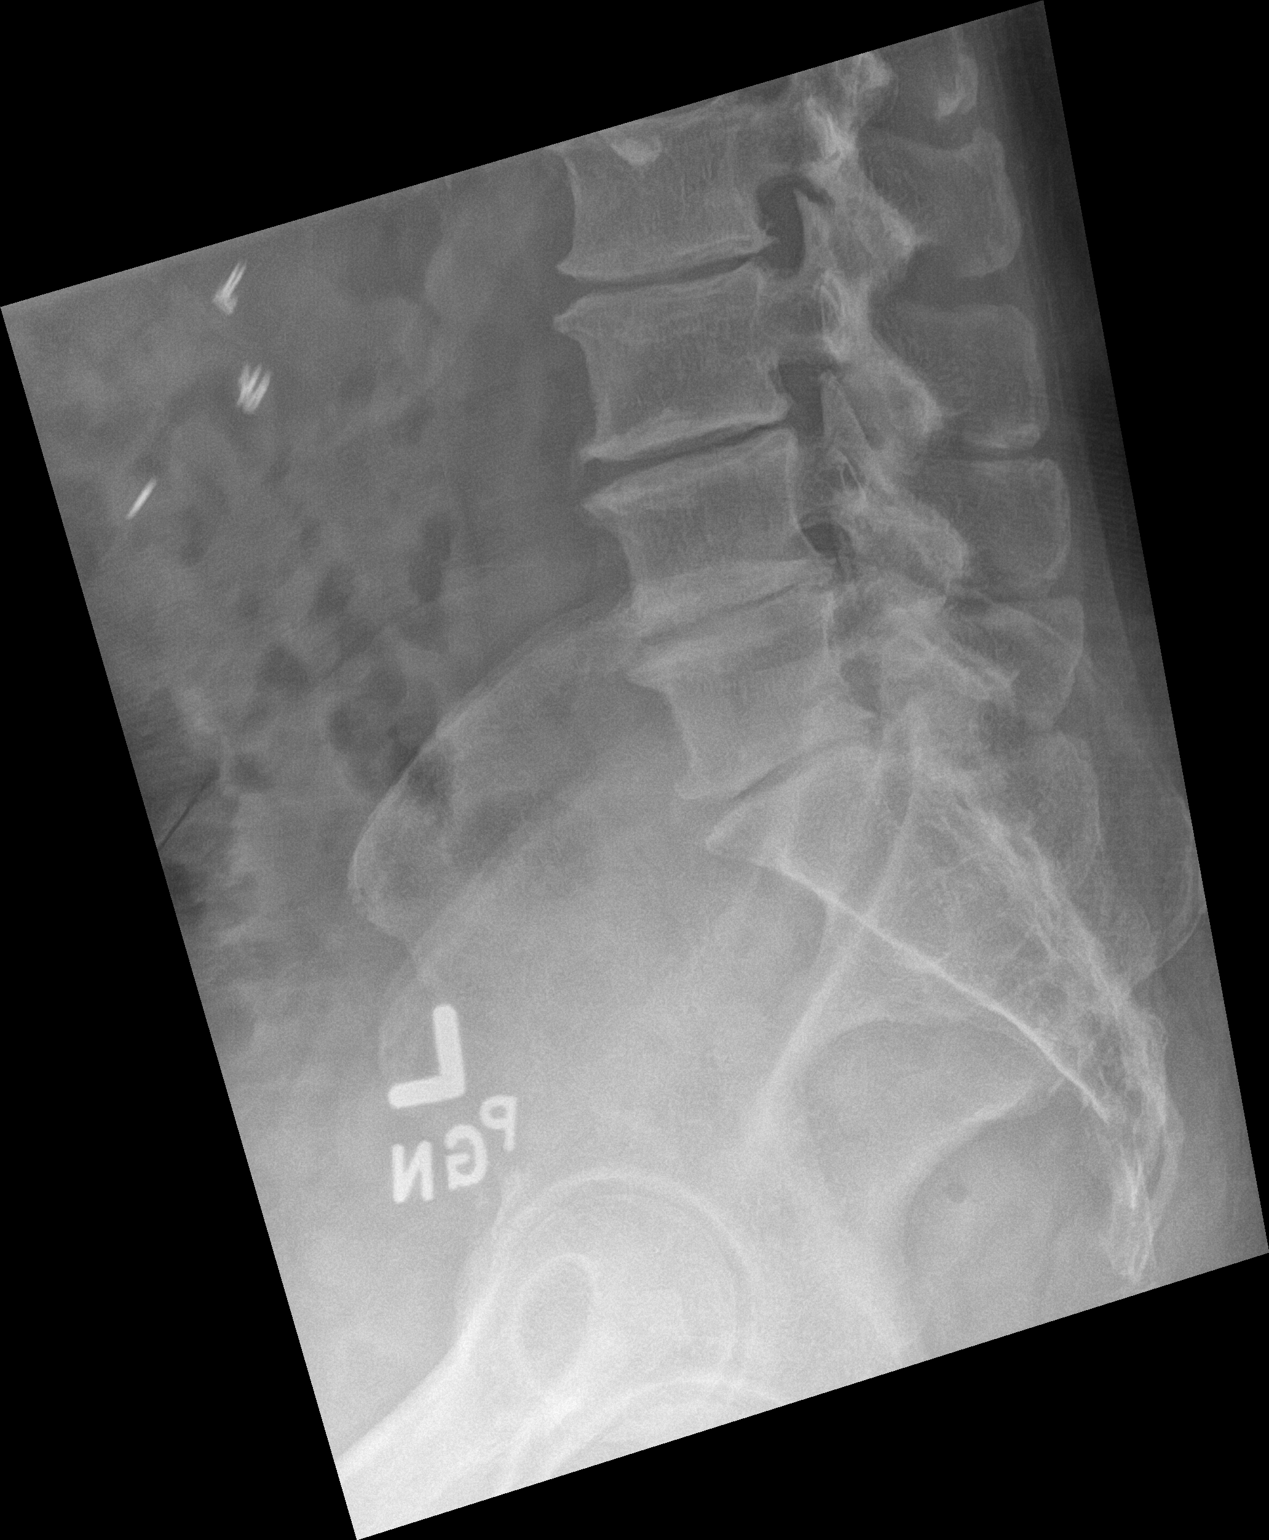

[3 of 3 positions shown; findings below may reference images not displayed]

FINDINGS: There is no acute displaced fracture or malalignment of all vein the
lumbar spine. There is moderate to severe disc height loss
throughout the visualized lumbar spine, greatest at the L4-L5 and
L5-S1 levels. There is advanced facet arthrosis in the lower lumbar
segments.

There is no acute displaced fracture involving the patient's pelvis.
Mild degenerative changes are noted of both hips. There is no
dislocation. No radiopaque foreign body.
IMPRESSION: 1. No acute osseous abnormality.
2. Multilevel degenerative changes of the lumbar spine, greatest at
L4-L5 and L5-S1.
3. Mild degenerative changes of both hips without evidence for an
acute osseous abnormality.

## 2022-06-22 ENCOUNTER — Encounter: Payer: Medicare PPO | Attending: Physical Medicine and Rehabilitation | Admitting: Physical Medicine and Rehabilitation

## 2022-06-22 VITALS — BP 137/69 | HR 71 | Ht 66.0 in | Wt 174.0 lb

## 2022-06-22 DIAGNOSIS — M5010 Cervical disc disorder with radiculopathy, unspecified cervical region: Secondary | ICD-10-CM | POA: Diagnosis not present

## 2022-06-22 DIAGNOSIS — M47816 Spondylosis without myelopathy or radiculopathy, lumbar region: Secondary | ICD-10-CM | POA: Diagnosis not present

## 2022-06-22 DIAGNOSIS — M5030 Other cervical disc degeneration, unspecified cervical region: Secondary | ICD-10-CM | POA: Diagnosis not present

## 2022-06-22 NOTE — Progress Notes (Addendum)
Subjective:    Patient ID: Alexandria [name], female    DOB: 27-Oct-1940, 82 y.o.   MRN: [id number]  HPI Alexandria [name] is an 82 year old who presents to establish care for neck pain.  1) Neck pain -she likes the idea of PT -she has high pain tolerant -average pain is 2/10 -she thought initially it was a sun burn -she saw her PCP Dr. Ronnald Ramp and he ordered an XR for her  2) Low back pain -uses lidocaine patch  Pain Inventory Average Pain 2 Pain Right Now 3 My pain is burning, tingling, and aching  In the last 24 hours, has pain interfered with the following? General activity 5 Relation with others 3 Enjoyment of life 7 What TIME of day is your pain at its worst? night Sleep (in general) Fair  Pain is worse with: walking, bending, sitting, inactivity, standing, and some activites Pain improves with: rest, heat/ice, therapy/exercise, medication, and injections Relief from Meds: 7  walk without assistance how many minutes can you walk? 30 ability to climb steps?  yes do you drive?  yes  retired  [employer]  New patient  Primary care      Family History  Problem Relation Age of Onset   Arthritis Other    Hypertension Other    Colon cancer Neg Hx    Esophageal cancer Neg Hx    Rectal cancer Neg Hx    Stomach cancer Neg Hx    Breast cancer Neg Hx    Social History   Socioeconomic History   Marital status: Widowed    Spouse name: Not on file   Number of children: 2   Years of education: Not on file   Highest education level: Not on file  Occupational History   Occupation: RN L&D [employer]  Tobacco Use   Smoking status: Never   Smokeless tobacco: Never  Vaping Use   Vaping Use: Never used  Substance and Sexual Activity   Alcohol use: No   Drug use: No   Sexual activity: Not Currently    Birth control/protection: Surgical  Other Topics Concern   Not on file  Social History Narrative   [employer] work.  Lives at home.  Two children.     Social Determinants of Health    Financial Resource Strain: Low Risk  (02/02/2022)   Overall Financial Resource Strain (CARDIA)    Difficulty of Paying Living Expenses: Not hard at all  Food Insecurity: No Food Insecurity (02/02/2022)   Hunger Vital Sign    Worried About Running Out of Food in the Last Year: Never true    Ran Out of Food in the Last Year: Never true  Transportation Needs: No Transportation Needs (02/02/2022)   PRAPARE - Hydrologist (Medical): No    Lack of Transportation (Non-Medical): No  Physical Activity: Insufficiently Active (02/02/2022)   Exercise Vital Sign    Days of Exercise per Week: 1 day    Minutes of Exercise per Session: 40 min  Stress: No Stress Concern Present (02/02/2022)   [place]    Feeling of Stress : Not at all  Social Connections: Moderately Integrated (02/02/2022)   Social Connection and Isolation Panel [NHANES]    Frequency of Communication with Friends and Family: More than three times a week    Frequency of Social Gatherings with Friends and Family: Once a week    Attends Religious Services: More than 4  times per year    Active Member of Clubs or Organizations: Yes    Attends Archivist Meetings: More than 4 times per year    Marital Status: Widowed   Past Surgical History:  Procedure Laterality Date   ABDOMINAL HYSTERECTOMY     BREAST EXCISIONAL BIOPSY Right    BREAST LUMPECTOMY     RIGHT 22 years ago   CHOLECYSTECTOMY     Past Medical History:  Diagnosis Date   Anemia, iron deficiency    Cataracts, bilateral    Colon polyp 09/2006   adenomatous   Diabetes ([place])    Fibrocystic breast disease    GERD (gastroesophageal reflux disease)    Glaucoma, right eye    Hyperlipidemia    Hypertension    LBP (low back pain)    Osteoarthritis    Osteopenia    There were no vitals taken for this visit.  Opioid Risk Score:   Fall Risk Score:  `1  Depression  screen [facility] 2/9     02/02/2022    9:19 AM 06/12/2021    2:44 PM 04/09/2020   11:54 AM 07/20/2019   10:39 AM 03/11/2018    9:56 AM 03/09/2018    4:45 PM 01/15/2017   11:41 AM  Depression screen PHQ 2/9  Decreased Interest 0 0 0 1 0 0 0  Down, Depressed, Hopeless 0 0 0 1 0 1 0  PHQ - 2 Score 0 0 0 2 0 1 0  Altered sleeping    1  3   Tired, decreased energy    0  1   Change in appetite    1  2   Feeling bad or failure about yourself     0  1   Trouble concentrating    0  0   Moving slowly or fidgety/restless    0  0   Suicidal thoughts    0  0   PHQ-9 Score    4  8   Difficult doing work/chores    Somewhat difficult  Somewhat difficult      Review of Systems  Musculoskeletal:  Positive for neck pain.  All other systems reviewed and are negative.      Objective:   Physical Exam Gen: no distress, normal appearing HEENT: oral mucosa pink and moist, NCAT Cardio: Reg rate Chest: normal effort, normal rate of breathing Abd: soft, non-distended Ext: no edema Psych: pleasant, normal affect Skin: intact Neuro: Alert and oriented x3 MSK: tight cervical myofascial, negative spurling's and Slump tests       Assessment & Plan:  1) Cervical radiculopathy: -MRI cervical spine ordered as symptoms have been  -Provided with a pain relief journal and discussed that it contains foods and lifestyle tips to naturally help to improve pain. Discussed that these lifestyle strategies are also very good for health unlike some medications which can have negative side effects. Discussed that the act of keeping a journal can be therapeutic and helpful to realize patterns what helps to trigger and alleviate pain.    2) Lumbar radiculopathy MRI lumbar spine ordered -Discussed Qutenza as an option for neuropathic pain control. Discussed that this is a capsaicin patch, stronger than capsaicin cream. Discussed that it is currently approved for diabetic peripheral neuropathy and post-herpetic neuralgia,  but that it has also shown benefit in treating other forms of neuropathy. Provided patient with link to site to learn more about the patch: [employer]. Discussed that the patch would be placed in  office and benefits usually last 3 months. Discussed that unintended exposure to capsaicin can cause severe irritation of eyes, mucous membranes, respiratory tract, and skin, but that Qutenza is a local treatment and does not have the systemic side effects of other nerve medications. Discussed that there may be pain, itching, erythema, and decreased sensory function associated with the application of Qutenza. Side effects usually subside within 1 week. A cold pack of analgesic medications can help with these side effects. Blood pressure can also be increased due to pain associated with administration of the patch.

## 2022-07-12 ENCOUNTER — Ambulatory Visit
Admission: RE | Admit: 2022-07-12 | Discharge: 2022-07-12 | Disposition: A | Discharge status: Home / Self Care | Payer: Medicare PPO | Source: Ambulatory Visit | Attending: Physical Medicine and Rehabilitation | Admitting: Physical Medicine and Rehabilitation

## 2022-07-12 DIAGNOSIS — M5412 Radiculopathy, cervical region: Secondary | ICD-10-CM | POA: Diagnosis not present

## 2022-07-12 DIAGNOSIS — M47816 Spondylosis without myelopathy or radiculopathy, lumbar region: Secondary | ICD-10-CM

## 2022-07-12 DIAGNOSIS — M4802 Spinal stenosis, cervical region: Secondary | ICD-10-CM | POA: Diagnosis not present

## 2022-07-12 DIAGNOSIS — M545 Low back pain, unspecified: Secondary | ICD-10-CM | POA: Diagnosis not present

## 2022-07-12 DIAGNOSIS — R29898 Other symptoms and signs involving the musculoskeletal system: Secondary | ICD-10-CM | POA: Diagnosis not present

## 2022-07-12 DIAGNOSIS — M48061 Spinal stenosis, lumbar region without neurogenic claudication: Secondary | ICD-10-CM | POA: Diagnosis not present

## 2022-07-12 DIAGNOSIS — M5010 Cervical disc disorder with radiculopathy, unspecified cervical region: Secondary | ICD-10-CM

## 2022-07-12 DIAGNOSIS — M5030 Other cervical disc degeneration, unspecified cervical region: Secondary | ICD-10-CM

## 2022-07-20 ENCOUNTER — Encounter: Payer: Medicare PPO | Attending: Physical Medicine and Rehabilitation | Admitting: Physical Medicine and Rehabilitation

## 2022-07-20 DIAGNOSIS — M5010 Cervical disc disorder with radiculopathy, unspecified cervical region: Secondary | ICD-10-CM | POA: Diagnosis not present

## 2022-07-20 DIAGNOSIS — M5030 Other cervical disc degeneration, unspecified cervical region: Secondary | ICD-10-CM | POA: Diagnosis not present

## 2022-07-21 NOTE — Progress Notes (Signed)
Subjective:    Patient ID: Alexandria [name], female    DOB: [birth year], 82 y.o.   MRN: [id number]  HPI An audio/video tele-health visit is felt to be the most appropriate encounter for this patient at this time. This is a follow up tele-visit via phone. The patient is at home. MD is at office. Prior to scheduling this appointment, our staff discussed the limitations of evaluation and management by telemedicine and the availability of in-person appointments. The patient expressed understanding and agreed to proceed.   Alexandria [name] is an 82 year old who presents for follow-up of neck and back pain  1) Neck pain -she likes the idea of PT -she has high pain tolerant -average pain is 2/10 -she thought initially it was a sun burn -she saw her PCP Dr. Yetta Barre and he ordered an XR for her -we ordered an MRI last visit  2) Low back pain -uses lidocaine patch -pain is more present in lower back than in legs  Pain Inventory Average Pain 2 Pain Right Now 3 My pain is burning, tingling, and aching  In the last 24 hours, has pain interfered with the following? General activity 5 Relation with others 3 Enjoyment of life 7 What TIME of day is your pain at its worst? night Sleep (in general) Fair  Pain is worse with: walking, bending, sitting, inactivity, standing, and some activites Pain improves with: rest, heat/ice, therapy/exercise, medication, and injections Relief from Meds: 7  walk without assistance how many minutes can you walk? 30 ability to climb steps?  yes do you drive?  yes  retired  [employer]  New patient  Primary care      Family History  Problem Relation Age of Onset   Arthritis Other    Hypertension Other    Colon cancer Neg Hx    Esophageal cancer Neg Hx    Rectal cancer Neg Hx    Stomach cancer Neg Hx    Breast cancer Neg Hx    Social History   Socioeconomic History   Marital status: Widowed    Spouse name: Not on file   Number of children: 2   Years  of education: Not on file   Highest education level: Not on file  Occupational History   Occupation: RN L&D [employer]  Tobacco Use   Smoking status: Never   Smokeless tobacco: Never  Vaping Use   Vaping Use: Never used  Substance and Sexual Activity   Alcohol use: No   Drug use: No   Sexual activity: Not Currently    Birth control/protection: Surgical  Other Topics Concern   Not on file  Social History Narrative   [employer] work.  Lives at home.  Two children.     Social Determinants of Health   Financial Resource Strain: Low Risk  (02/02/2022)   Overall Financial Resource Strain (CARDIA)    Difficulty of Paying Living Expenses: Not hard at all  Food Insecurity: No Food Insecurity (02/02/2022)   Hunger Vital Sign    Worried About Running Out of Food in the Last Year: Never true    Ran Out of Food in the Last Year: Never true  Transportation Needs: No Transportation Needs (02/02/2022)   PRAPARE - Administrator, Civil Service (Medical): No    Lack of Transportation (Non-Medical): No  Physical Activity: Insufficiently Active (02/02/2022)   Exercise Vital Sign    Days of Exercise per Week: 1 day    Minutes of Exercise per  Session: 40 min  Stress: No Stress Concern Present (02/02/2022)   [employer] of Occupational Health - Occupational Stress Questionnaire    Feeling of Stress : Not at all  Social Connections: Moderately Integrated (02/02/2022)   Social Connection and Isolation Panel [NHANES]    Frequency of Communication with Friends and Family: More than three times a week    Frequency of Social Gatherings with Friends and Family: Once a week    Attends Religious Services: More than 4 times per year    Active Member of [employer] or Organizations: Yes    Attends Banker Meetings: More than 4 times per year    Marital Status: Widowed   Past Surgical History:  Procedure Laterality Date   ABDOMINAL HYSTERECTOMY     BREAST EXCISIONAL BIOPSY Right    BREAST  LUMPECTOMY     RIGHT 22 years ago   CHOLECYSTECTOMY     Past Medical History:  Diagnosis Date   Anemia, iron deficiency    Cataracts, bilateral    Colon polyp 09/2006   adenomatous   Diabetes ([facility])    Fibrocystic breast disease    GERD (gastroesophageal reflux disease)    Glaucoma, right eye    Hyperlipidemia    Hypertension    LBP (low back pain)    Osteoarthritis    Osteopenia    There were no vitals taken for this visit.  Opioid Risk Score:   Fall Risk Score:  `1  Depression screen [facility] 2/9     06/22/2022    9:54 AM 02/02/2022    9:19 AM 06/12/2021    2:44 PM 04/09/2020   11:54 AM 07/20/2019   10:39 AM 03/11/2018    9:56 AM 03/09/2018    4:45 PM  Depression screen PHQ 2/9  Decreased Interest 1 0 0 0 1 0 0  Down, Depressed, Hopeless 1 0 0 0 1 0 1  PHQ - 2 Score 2 0 0 0 2 0 1  Altered sleeping Tired, decreased energy 2    0  1  Change in appetite Feeling bad or failure about yourself  1    0  1  Trouble concentrating 1    0  0  Moving slowly or fidgety/restless 0    0  0  Suicidal thoughts 0    0  0  PHQ-9 Score Difficult doing work/chores     Somewhat difficult  Somewhat difficult     Review of Systems  Musculoskeletal:  Positive for neck pain.  All other systems reviewed and are negative.      Objective:   Physical Exam Not performed       Assessment & Plan:  1) Cervical radiculopathy: -MRI cervical spine ordered and discussed findings of severe cervical stenosis -discussed epidural steroid inections -Provided with a pain relief journal and discussed that it contains foods and lifestyle tips to naturally help to improve pain. Discussed that these lifestyle strategies are also very good for health unlike some medications which can have negative side effects. Discussed that the act of keeping a journal can be therapeutic and helpful to realize patterns what helps to trigger and alleviate pain.    2) Lumbar  radiculopathy/facet arthropathy -discussed that she has done physical therapy and steroid injections in the past MRI lumbar spine ordered and discussed findings of facet arthropathy -discussed medial  branch blocks -Discussed Qutenza as an option for neuropathic pain control. Discussed that this is a capsaicin patch, stronger than capsaicin cream. Discussed that it is currently approved for diabetic peripheral neuropathy and post-herpetic neuralgia, but that it has also shown benefit in treating other forms of neuropathy. Provided patient with link to site to learn more about the patch: https://www.clark.biz/. Discussed that the patch would be placed in office and benefits usually last 3 months. Discussed that unintended exposure to capsaicin can cause severe irritation of eyes, mucous membranes, respiratory tract, and skin, but that Qutenza is a local treatment and does not have the systemic side effects of other nerve medications. Discussed that there may be pain, itching, erythema, and decreased sensory function associated with the application of Qutenza. Side effects usually subside within 1 week. A cold pack of analgesic medications can help with these side effects. Blood pressure can also be increased due to pain associated with administration of the patch.    19 minutes spent in reviewing the results of her cervical and lumbar MRIs with her and her daughter, discussed that she has done physical therapy and steroid injections for her back in the past, discussed medial branch blocks for her back, epidural steroid injections for her neck and arm pain, and Qutenza as a treatment option for both

## 2022-08-04 ENCOUNTER — Encounter: Payer: Medicare PPO | Admitting: Physical Medicine and Rehabilitation

## 2022-08-21 ENCOUNTER — Other Ambulatory Visit: Payer: Self-pay | Admitting: Internal Medicine

## 2022-08-21 DIAGNOSIS — I10 Essential (primary) hypertension: Secondary | ICD-10-CM

## 2022-08-21 MED ORDER — INDAPAMIDE 1.25 MG PO TABS: 1.2500 mg | ORAL_TABLET | Freq: Every day | ORAL | 0 refills | Status: DC

## 2022-08-26 ENCOUNTER — Telehealth: Payer: Self-pay | Admitting: Internal Medicine

## 2022-08-26 DIAGNOSIS — I10 Essential (primary) hypertension: Secondary | ICD-10-CM

## 2022-08-26 MED ORDER — INDAPAMIDE 1.25 MG PO TABS
1.2500 mg | ORAL_TABLET | Freq: Every day | ORAL | 0 refills | Status: DC
Start: 2022-08-26 — End: 2023-01-11

## 2022-08-26 NOTE — Telephone Encounter (Signed)
Prescription Request  08/26/2022  LOV: 05/11/2022  What is the name of the medication or equipment? indapamide (LOZOL) 1.25 MG tablet [   Have you contacted your pharmacy to request a refill? No   Which pharmacy would you like this sent to?     [facility] - [place], [place] - [address] [facility] [address] [place] [address] Phone: [phone] Fax: [phone]  Patient notified that their request is being sent to the clinical staff for review and that they should receive a response within 2 business days.   Please advise at Mobile [phone] (mobile)

## 2022-10-06 ENCOUNTER — Telehealth: Payer: Self-pay | Admitting: Internal Medicine

## 2022-10-06 ENCOUNTER — Other Ambulatory Visit: Payer: Self-pay | Admitting: Internal Medicine

## 2022-10-06 DIAGNOSIS — I10 Essential (primary) hypertension: Secondary | ICD-10-CM

## 2022-10-06 DIAGNOSIS — E118 Type 2 diabetes mellitus with unspecified complications: Secondary | ICD-10-CM

## 2022-10-06 MED ORDER — LOSARTAN POTASSIUM 100 MG PO TABS: 100.0000 mg | ORAL_TABLET | Freq: Every day | ORAL | 0 refills | Status: DC

## 2022-10-06 NOTE — Telephone Encounter (Signed)
Prescription Request  10/06/2022  LOV: 05/11/2022  What is the name of the medication or equipment?  losartan (COZAAR) 100 MG tablet  Have you contacted your pharmacy to request a refill? No   Which pharmacy would you like this sent to?  [facility] - [place], [place] - [address] [facility] [address] [place] [address] Phone: [phone] Fax: [phone]    Patient notified that their request is being sent to the clinical staff for review and that they should receive a response within 2 business days.   Please advise at Mobile [phone] (mobile)

## 2022-10-14 LAB — HM DIABETES EYE EXAM

## 2022-10-15 DIAGNOSIS — H401131 Primary open-angle glaucoma, bilateral, mild stage: Secondary | ICD-10-CM | POA: Diagnosis not present

## 2022-10-15 DIAGNOSIS — H16223 Keratoconjunctivitis sicca, not specified as Sjogren's, bilateral: Secondary | ICD-10-CM | POA: Diagnosis not present

## 2022-10-15 DIAGNOSIS — E119 Type 2 diabetes mellitus without complications: Secondary | ICD-10-CM | POA: Diagnosis not present

## 2022-12-01 ENCOUNTER — Other Ambulatory Visit: Payer: Self-pay | Admitting: Internal Medicine

## 2022-12-14 ENCOUNTER — Other Ambulatory Visit: Payer: Self-pay | Admitting: Internal Medicine

## 2022-12-14 DIAGNOSIS — Z1231 Encounter for screening mammogram for malignant neoplasm of breast: Secondary | ICD-10-CM

## 2022-12-20 ENCOUNTER — Other Ambulatory Visit: Payer: Self-pay | Admitting: Internal Medicine

## 2023-01-07 ENCOUNTER — Ambulatory Visit: Payer: Medicare PPO

## 2023-01-07 ENCOUNTER — Ambulatory Visit: Payer: Medicare PPO | Admitting: Internal Medicine

## 2023-01-07 ENCOUNTER — Telehealth: Payer: Self-pay

## 2023-01-07 VITALS — Ht 66.0 in | Wt 183.0 lb

## 2023-01-07 DIAGNOSIS — Z0000 Encounter for general adult medical examination without abnormal findings: Secondary | ICD-10-CM

## 2023-01-07 NOTE — Telephone Encounter (Signed)
Patient requesting a new rx for Fusion Plus to be sent to [facility] at [facility], because insurance covers it now.  The patient has d/c taking Ferrous Sulfate 325.  Also patient is scheduled to see pcp in November for regular check up and needs all other meds sent to [facility] for refills.

## 2023-01-07 NOTE — Progress Notes (Signed)
Subjective:   Alexandria [name] is a 82 y.o. female who presents for Medicare Annual (Subsequent) preventive examination.  Visit Complete: Virtual I connected with  Alexandria [name] on 01/07/23 by a audio enabled telemedicine application and verified that I am speaking with the correct person using two identifiers.  Patient Location: Home  Provider Location: Office/Clinic  I discussed the limitations of evaluation and management by telemedicine. The patient expressed understanding and agreed to proceed.  Vital Signs: Because this visit was a virtual/telehealth visit, some criteria may be missing or patient reported. Any vitals not documented were not able to be obtained and vitals that have been documented are patient reported.  Cardiac Risk Factors include: advanced age (>20men, >1 women);dyslipidemia;family history of premature cardiovascular disease;hypertension     Objective:    Today's Vitals   01/07/23 1331 01/07/23 1332  Weight: 183 lb (83 kg)   Height: 5\' 6"  (1.676 m)   PainSc: 3  3   PainLoc: Back    Body mass index is 29.54 kg/m.     01/07/2023    1:37 PM 02/02/2022    9:20 AM 05/08/2020    8:50 AM 04/09/2020   11:56 AM 01/15/2017   11:39 AM 10/30/2016    7:38 AM  Advanced Directives  Does Patient Have a Medical Advance Directive? Yes No No No Yes No  Type of Advance Directive Living will    Healthcare Power of Attorney   Copy of Healthcare Power of Attorney in Chart?     No - copy requested   Would patient like information on creating a medical advance directive?  Yes (MAU/Ambulatory/Procedural Areas - Information given) Yes (MAU/Ambulatory/Procedural Areas - Information given) No - Patient declined      Current Medications (verified) Outpatient Encounter Medications as of 01/07/2023  Medication Sig   Bimatoprost (LUMIGAN OP) Apply to eye.   Blood Glucose Calibration (ACCU-CHEK GUIDE CONTROL) LIQD 1 Act by In Vitro route 2 (two) times daily.   Blood Glucose  Monitoring Suppl (ACCU-CHEK GUIDE ME) w/Device KIT 1 Act by Does not apply route 2 (two) times daily.   cyclobenzaprine (FLEXERIL) 5 MG tablet Take 1 tablet (5 mg total) by mouth 3 (three) times daily as needed for muscle spasms.   eszopiclone (LUNESTA) 2 MG TABS tablet Take 1 tablet (2 mg total) by mouth at bedtime as needed for sleep. Take immediately before bedtime   fluocinonide cream (LIDEX) 0.05 % Apply 1 application. topically 2 (two) times daily.   glucose blood (ACCU-CHEK GUIDE) test strip 1 each by Other route 2 (two) times daily. Use as instructed   indapamide (LOZOL) 1.25 MG tablet Take 1 tablet (1.25 mg total) by mouth daily.   lidocaine (LIDODERM) 5 % Place 1 patch onto the skin daily. Remove & Discard patch within 12 hours or as directed by MD   losartan (COZAAR) 100 MG tablet Take 1 tablet (100 mg total) by mouth daily.   nabumetone (RELAFEN) 500 MG tablet Take 1 tablet (500 mg total) by mouth 2 (two) times daily as needed.   pantoprazole (PROTONIX) 40 MG tablet Take 1 tablet (40 mg total) by mouth daily.   potassium chloride SA (KLOR-CON M) 20 MEQ tablet Take 1 tablet (20 mEq total) by mouth 2 (two) times daily.   rosuvastatin (CRESTOR) 20 MG tablet Take 1 tablet (20 mg total) by mouth daily.   Turmeric 500 MG CAPS Take 1 capsule by mouth daily.   XIIDRA 5 % SOLN Apply 1 drop  to eye 2 (two) times daily.   Iron, Ferrous Sulfate, 325 (65 Fe) MG TABS Take 325 mg by mouth daily. (Patient not taking: Reported on 01/07/2023)   No facility-administered encounter medications on file as of 01/07/2023.    Allergies (verified) Oxycodone-acetaminophen and Tramadol hcl   History: Past Medical History:  Diagnosis Date   Anemia, iron deficiency    Cataracts, bilateral    Colon polyp 09/2006   adenomatous   Diabetes (HCC)    Fibrocystic breast disease    GERD (gastroesophageal reflux disease)    Glaucoma, right eye    Hyperlipidemia    Hypertension    LBP (low back pain)     Osteoarthritis    Osteopenia    Past Surgical History:  Procedure Laterality Date   ABDOMINAL HYSTERECTOMY     BREAST EXCISIONAL BIOPSY Right    BREAST LUMPECTOMY     RIGHT 22 years ago   CHOLECYSTECTOMY     Family History  Problem Relation Age of Onset   Arthritis Other    Hypertension Other    Colon cancer Neg Hx    Esophageal cancer Neg Hx    Rectal cancer Neg Hx    Stomach cancer Neg Hx    Breast cancer Neg Hx    Social History   Socioeconomic History   Marital status: Widowed    Spouse name: Not on file   Number of children: 2   Years of education: Not on file   Highest education level: Not on file  Occupational History   Occupation: RN L&D [employer]  Tobacco Use   Smoking status: Never   Smokeless tobacco: Never  Vaping Use   Vaping status: Never Used  Substance and Sexual Activity   Alcohol use: No   Drug use: No   Sexual activity: Not Currently    Birth control/protection: Surgical  Other Topics Concern   Not on file  Social History Narrative   [employer] work.  Lives at home.  Two children.     Social Determinants of Health   Financial Resource Strain: Low Risk  (01/07/2023)   Overall Financial Resource Strain (CARDIA)    Difficulty of Paying Living Expenses: Not hard at all  Food Insecurity: No Food Insecurity (01/07/2023)   Hunger Vital Sign    Worried About Running Out of Food in the Last Year: Never true    Ran Out of Food in the Last Year: Never true  Transportation Needs: No Transportation Needs (01/07/2023)   PRAPARE - Administrator, Civil Service (Medical): No    Lack of Transportation (Non-Medical): No  Physical Activity: Sufficiently Active (01/07/2023)   Exercise Vital Sign    Days of Exercise per Week: 5 days    Minutes of Exercise per Session: 30 min  Stress: No Stress Concern Present (01/07/2023)   [employer] of Occupational Health - Occupational Stress Questionnaire    Feeling of Stress : Not at all  Social Connections:  Moderately Integrated (01/07/2023)   Social Connection and Isolation Panel [NHANES]    Frequency of Communication with Friends and Family: More than three times a week    Frequency of Social Gatherings with Friends and Family: Once a week    Attends Religious Services: More than 4 times per year    Active Member of [employer] or Organizations: Yes    Attends Banker Meetings: More than 4 times per year    Marital Status: Widowed    Tobacco Counseling Counseling given:  Not Answered   Clinical Intake:  Pre-visit preparation completed: Yes  Pain : 0-10 Pain Score: 3  Pain Type: Chronic pain Pain Location: Back     BMI - recorded: 29.54 Nutritional Status: BMI 25 -29 Overweight Nutritional Risks: None Diabetes: No (prediabetes) CBG done?: No Did pt. bring in CBG monitor from home?: No  How often do you need to have someone help you when you read instructions, pamphlets, or other written materials from your doctor or pharmacy?: 1 - Never What is the last grade level you completed in school?: COLLEGE GRADUATE; RETIIRED NURSE  Interpreter Needed?: No  Information entered by :: Susie Cassette, LPN.   Activities of Daily Living    01/07/2023    1:41 PM 12/31/2022    9:28 AM  In your present state of health, do you have any difficulty performing the following activities:  Hearing? 0 0  Vision? 0 0  Difficulty concentrating or making decisions? 0 0  Walking or climbing stairs? 0 1  Dressing or bathing? 0 0  Doing errands, shopping? 0 0  Preparing Food and eating ? N N  Using the Toilet? N N  In the past six months, have you accidently leaked urine? N N  Do you have problems with loss of bowel control? N N  Managing your Medications? N N  Managing your Finances? N N  Housekeeping or managing your Housekeeping? N N    Patient Care Team: Etta Grandchild, MD as PCP - General Rollene Rotunda, MD as PCP - Cardiology (Cardiology) Chalmers Guest, MD as Consulting  Physician (Ophthalmology)  Indicate any recent Medical Services you may have received from other than [facility] providers in the past year (date may be approximate).     Assessment:   This is a routine wellness examination for Alexandria [name].  Hearing/Vision screen Hearing Screening - Comments:: Patient denied any hearing difficulty.   No hearing aids.  Vision Screening - Comments:: Patient does wear corrective lenses/contacts.  Annual eye exam done by: Chalmers Guest, MD (four times a year)    Goals Addressed             This Visit's Progress    Maintain my health by staying idependent, physically and socially involved.        Depression Screen    01/07/2023    1:40 PM 06/22/2022    9:54 AM 02/02/2022    9:19 AM 06/12/2021    2:44 PM 04/09/2020   11:54 AM 07/20/2019   10:39 AM 03/11/2018    9:56 AM  PHQ 2/9 Scores  PHQ - 2 Score 0 2 0 0 0 2 0  PHQ- 9 Score 3 10    4      Fall Risk    01/07/2023    1:39 PM 12/31/2022    9:28 AM 02/02/2022    9:20 AM 01/29/2022    5:16 PM 06/12/2021    2:44 PM  Fall Risk   Falls in the past year? 0 0 0 0 0  Number falls in past yr: 0  0    Injury with Fall? 0  0    Risk for fall due to : No Fall Risks  No Fall Risks    Follow up Falls prevention discussed  Falls evaluation completed      MEDICARE RISK AT HOME: Medicare Risk at Home Any stairs in or around the home?: Yes If so, are there any without handrails?: No Home free of loose throw rugs in walkways, pet  beds, electrical cords, etc?: Yes Adequate lighting in your home to reduce risk of falls?: Yes Life alert?: No Use of a cane, walker or w/c?: No Grab bars in the bathroom?: No Shower chair or bench in shower?: Yes Elevated toilet seat or a handicapped toilet?: Yes  TIMED UP AND GO:  Was the test performed?  No    Cognitive Function:    01/07/2023    1:43 PM  MMSE - Mini Mental State Exam  Not completed: Unable to complete        01/07/2023    1:40 PM 02/02/2022    9:23 AM   6CIT Screen  What Year? 0 points 0 points  What month? 0 points 0 points  What time? 0 points 0 points  Count back from 20 0 points 0 points  Months in reverse 0 points 0 points  Repeat phrase 0 points 0 points  Total Score 0 points 0 points    Immunizations Immunization History  Administered Date(s) Administered   Fluad Quad(high Dose 65+) 03/06/2020, 03/09/2022   Influenza, High Dose Seasonal PF 01/09/2016, 01/12/2017, 02/19/2018, 02/13/2019, 03/09/2022, 11/29/2022   Influenza,inj,Quad PF,6+ Mos 12/21/2013, 01/09/2015   Moderna Covid-19 Fall Seasonal Vaccine 71yrs & older 11/29/2022   PFIZER(Purple Top)SARS-COV-2 Vaccination 04/18/2019, 05/09/2019, 03/07/2020   PNEUMOCOCCAL CONJUGATE-20 11/29/2022   Pneumococcal Conjugate-13 12/21/2013   Pneumococcal Polysaccharide-23 10/06/2011, 11/15/2018   Td 10/17/2009   Zoster Recombinant(Shingrix) 09/26/2021, 03/09/2022   Zoster, Live 10/17/2009    TDAP status: Due, Education has been provided regarding the importance of this vaccine. Advised may receive this vaccine at local pharmacy or Health Dept. Aware to provide a copy of the vaccination record if obtained from local pharmacy or Health Dept. Verbalized acceptance and understanding.  Flu Vaccine status: Up to date  Pneumococcal vaccine status: Up to date  Covid-19 vaccine status: Completed vaccines  Qualifies for Shingles Vaccine? Yes   Zostavax completed Yes   Shingrix Completed?: Yes  Screening Tests Health Maintenance  Topic Date Due   DTaP/Tdap/Td (2 - Tdap) 10/18/2019   OPHTHALMOLOGY EXAM  11/01/2019   FOOT EXAM  06/13/2022   Diabetic kidney evaluation - Urine ACR  09/16/2022   HEMOGLOBIN A1C  11/09/2022   COVID-19 Vaccine (5 - 2023-24 season) 01/24/2023   Diabetic kidney evaluation - eGFR measurement  05/12/2023   Medicare Annual Wellness (AWV)  01/07/2024   Pneumonia Vaccine 96+ Years old  Completed   INFLUENZA VACCINE  Completed   DEXA SCAN  Completed   Zoster  Vaccines- Shingrix  Completed   HPV VACCINES  Aged Out   Hepatitis C Screening  Discontinued    Health Maintenance  Health Maintenance Due  Topic Date Due   DTaP/Tdap/Td (2 - Tdap) 10/18/2019   OPHTHALMOLOGY EXAM  11/01/2019   FOOT EXAM  06/13/2022   Diabetic kidney evaluation - Urine ACR  09/16/2022   HEMOGLOBIN A1C  11/09/2022    Colorectal cancer screening: No longer required.   Mammogram status: Completed 01/27/2022. Repeat every year  Bone Density status: Completed 08/09/2019. Results reflect: Bone density results: NORMAL. Repeat every 5 years.  Lung Cancer Screening: (Low Dose CT Chest recommended if Age 4-80 years, 20 pack-year currently smoking OR have quit w/in 15years.) does not qualify.   Lung Cancer Screening Referral: no  Additional Screening:  Hepatitis C Screening: does qualify; Completed 10/19/2019  Vision Screening: Recommended annual ophthalmology exams for early detection of glaucoma and other disorders of the eye. Is the patient up to date with their annual  eye exam?  Yes  Who is the provider or what is the name of the office in which the patient attends annual eye exams? Chalmers Guest, MD. If pt is not established with a provider, would they like to be referred to a provider to establish care? No .   Dental Screening: Recommended annual dental exams for proper oral hygiene  Diabetic Foot Exam: Prediabetes  Community Resource Referral / Chronic Care Management: CRR required this visit?  No   CCM required this visit?  No     Plan:     I have personally reviewed and noted the following in the patient's chart:   Medical and social history Use of alcohol, tobacco or illicit drugs  Current medications and supplements including opioid prescriptions. Patient is not currently taking opioid prescriptions. Functional ability and status Nutritional status Physical activity Advanced directives List of other physicians Hospitalizations, surgeries, and  ER visits in previous 12 months Vitals Screenings to include cognitive, depression, and falls Referrals and appointments  In addition, I have reviewed and discussed with patient certain preventive protocols, quality metrics, and best practice recommendations. A written personalized care plan for preventive services as well as general preventive health recommendations were provided to patient.     Mickeal Needy, LPN   16/12/9602   After Visit Summary: (MyChart) Due to this being a telephonic visit, the after visit summary with patients personalized plan was offered to patient via MyChart   Nurse Notes: Normal cognitive status assessed by direct observation via telephone conversation by this Nurse Health Advisor. No abnormalities found.

## 2023-01-07 NOTE — Patient Instructions (Signed)
Alexandria [name] , Thank you for taking time to come for your Medicare Wellness Visit. I appreciate your ongoing commitment to your health goals. Please review the following plan we discussed and let me know if I can assist you in the future.   Referrals/Orders/Follow-Ups/Clinician Recommendations: No  This is a list of the screening recommended for you and due dates:  Health Maintenance  Topic Date Due   DTaP/Tdap/Td vaccine (2 - Tdap) 10/18/2019   Eye exam for diabetics  11/01/2019   Complete foot exam   06/13/2022   Yearly kidney health urinalysis for diabetes  09/16/2022   Hemoglobin A1C  11/09/2022   COVID-19 Vaccine (5 - 2023-24 season) 01/24/2023   Yearly kidney function blood test for diabetes  05/12/2023   Medicare Annual Wellness Visit  01/07/2024   Pneumonia Vaccine  Completed   Flu Shot  Completed   DEXA scan (bone density measurement)  Completed   Zoster (Shingles) Vaccine  Completed   HPV Vaccine  Aged Out   Hepatitis C Screening  Discontinued    Advanced directives: (Copy Requested) Please bring a copy of your health care power of attorney and living will to the office to be added to your chart at your convenience.  Next Medicare Annual Wellness Visit scheduled for next year: Yes

## 2023-01-08 ENCOUNTER — Ambulatory Visit: Payer: Medicare PPO | Admitting: Internal Medicine

## 2023-01-08 ENCOUNTER — Encounter: Payer: Self-pay | Admitting: Internal Medicine

## 2023-01-08 VITALS — BP 142/78 | HR 80 | Ht 66.0 in | Wt 183.0 lb

## 2023-01-08 DIAGNOSIS — R9431 Abnormal electrocardiogram [ECG] [EKG]: Secondary | ICD-10-CM | POA: Diagnosis not present

## 2023-01-08 DIAGNOSIS — R011 Cardiac murmur, unspecified: Secondary | ICD-10-CM | POA: Diagnosis not present

## 2023-01-08 DIAGNOSIS — N1831 Chronic kidney disease, stage 3a: Secondary | ICD-10-CM

## 2023-01-08 DIAGNOSIS — G8929 Other chronic pain: Secondary | ICD-10-CM | POA: Diagnosis not present

## 2023-01-08 DIAGNOSIS — E785 Hyperlipidemia, unspecified: Secondary | ICD-10-CM

## 2023-01-08 DIAGNOSIS — M150 Primary generalized (osteo)arthritis: Secondary | ICD-10-CM

## 2023-01-08 DIAGNOSIS — Z0001 Encounter for general adult medical examination with abnormal findings: Secondary | ICD-10-CM | POA: Diagnosis not present

## 2023-01-08 DIAGNOSIS — M545 Low back pain, unspecified: Secondary | ICD-10-CM

## 2023-01-08 DIAGNOSIS — G4701 Insomnia due to medical condition: Secondary | ICD-10-CM

## 2023-01-08 DIAGNOSIS — I10 Essential (primary) hypertension: Secondary | ICD-10-CM | POA: Diagnosis not present

## 2023-01-08 DIAGNOSIS — E118 Type 2 diabetes mellitus with unspecified complications: Secondary | ICD-10-CM

## 2023-01-08 DIAGNOSIS — L602 Onychogryphosis: Secondary | ICD-10-CM | POA: Insufficient documentation

## 2023-01-08 DIAGNOSIS — D508 Other iron deficiency anemias: Secondary | ICD-10-CM | POA: Diagnosis not present

## 2023-01-08 DIAGNOSIS — E876 Hypokalemia: Secondary | ICD-10-CM

## 2023-01-08 DIAGNOSIS — I447 Left bundle-branch block, unspecified: Secondary | ICD-10-CM | POA: Insufficient documentation

## 2023-01-08 LAB — CBC WITH DIFFERENTIAL/PLATELET
Basophils Absolute: 0 10*3/uL (ref 0.0–0.1)
Basophils Relative: 0.8 % (ref 0.0–3.0)
Eosinophils Absolute: 0.2 10*3/uL (ref 0.0–0.7)
Eosinophils Relative: 4.5 % (ref 0.0–5.0)
HCT: 38.4 % (ref 36.0–46.0)
Hemoglobin: 12.3 g/dL (ref 12.0–15.0)
Lymphocytes Relative: 36.6 % (ref 12.0–46.0)
Lymphs Abs: 1.3 10*3/uL (ref 0.7–4.0)
MCHC: 32.1 g/dL (ref 30.0–36.0)
MCV: 88.8 fL (ref 78.0–100.0)
Monocytes Absolute: 0.3 10*3/uL (ref 0.1–1.0)
Monocytes Relative: 9.7 % (ref 3.0–12.0)
Neutro Abs: 1.7 10*3/uL (ref 1.4–7.7)
Neutrophils Relative %: 48.4 % (ref 43.0–77.0)
Platelets: 238 10*3/uL (ref 150.0–400.0)
RBC: 4.33 Mil/uL (ref 3.87–5.11)
RDW: 14.8 % (ref 11.5–15.5)
WBC: 3.6 10*3/uL — ABNORMAL LOW (ref 4.0–10.5)

## 2023-01-08 LAB — BASIC METABOLIC PANEL
BUN: 19 mg/dL (ref 6–23)
CO2: 30 meq/L (ref 19–32)
Calcium: 9.8 mg/dL (ref 8.4–10.5)
Chloride: 100 meq/L (ref 96–112)
Creatinine, Ser: 0.82 mg/dL (ref 0.40–1.20)
GFR: 67.45 mL/min (ref >60.00)
Glucose, Bld: 89 mg/dL (ref 70–99)
Potassium: 3.7 meq/L (ref 3.5–5.1)
Sodium: 139 meq/L (ref 135–145)

## 2023-01-08 LAB — URINALYSIS, ROUTINE W REFLEX MICROSCOPIC
Bilirubin Urine: NEGATIVE
Hgb urine dipstick: NEGATIVE
Ketones, ur: NEGATIVE
Leukocytes,Ua: NEGATIVE
Nitrite: NEGATIVE
RBC / HPF: NONE SEEN (ref 0–?)
Specific Gravity, Urine: 1.015 (ref 1.000–1.030)
Total Protein, Urine: NEGATIVE
Urine Glucose: NEGATIVE
Urobilinogen, UA: 0.2 (ref 0.0–1.0)
pH: 7 (ref 5.0–8.0)

## 2023-01-08 LAB — LIPID PANEL
Cholesterol: 158 mg/dL (ref 0–200)
HDL: 67.6 mg/dL (ref >39.00)
LDL Cholesterol: 75 mg/dL (ref 0–99)
NonHDL: 90.69
Total CHOL/HDL Ratio: 2
Triglycerides: 77 mg/dL (ref 0.0–149.0)
VLDL: 15.4 mg/dL (ref 0.0–40.0)

## 2023-01-08 LAB — MICROALBUMIN / CREATININE URINE RATIO
Creatinine,U: 76 mg/dL
Microalb Creat Ratio: 0.9 mg/g (ref 0.0–30.0)
Microalb, Ur: <0.7 mg/dL (ref 0.0–1.9)

## 2023-01-08 LAB — TSH: TSH: 1.42 u[IU]/mL (ref 0.35–5.50)

## 2023-01-08 LAB — HEMOGLOBIN A1C: Hgb A1c MFr Bld: 6.4 % (ref 4.6–6.5)

## 2023-01-08 MED ORDER — ROSUVASTATIN CALCIUM 20 MG PO TABS
20.0000 mg | ORAL_TABLET | Freq: Every day | ORAL | 1 refills | Status: DC
Start: 2023-01-08 — End: 2023-08-30

## 2023-01-08 MED ORDER — FUSION PLUS PO CAPS: 1.0000 | ORAL_CAPSULE | Freq: Every day | ORAL | 0 refills | Status: DC

## 2023-01-08 MED ORDER — BOOSTRIX 5-2.5-18.5 LF-MCG/0.5 IM SUSP
0.5000 mL | Freq: Once | INTRAMUSCULAR | 0 refills | Status: AC
Start: 2023-01-08 — End: 2023-01-08

## 2023-01-08 MED ORDER — LIDOCAINE 5 % EX PTCH: 1.0000 | MEDICATED_PATCH | CUTANEOUS | 1 refills | Status: AC

## 2023-01-08 NOTE — Patient Instructions (Signed)

## 2023-01-08 NOTE — Progress Notes (Signed)
Subjective:  Patient ID: ELLISHA [name], female    DOB: [birth year]  Age: 82 y.o. MRN: [id number]  CC: Annual Exam, Hyperlipidemia, Hypertension, and Diabetes   HPI Alexandria [name] presents for a CPX and f/up ---  Discussed the use of AI scribe software for clinical note transcription with the patient, who gave verbal consent to proceed.  History of Present Illness   The patient, with a history of heart murmur and diabetes, reports no recent chest pain, shortness of breath, or dizziness. She has been monitoring her blood sugar levels sporadically in the morning, with an average reading of 100.  In addition, the patient has regular eye exams approximately four times a year due to a diagnosis of cataracts. The last eye exam was likely in July, with the next scheduled for November 21st. The cataracts have not been changing and do not affect the patient's vision significantly. The patient uses eye drops for this condition. Her glasses prescription is up-to-date.       Outpatient Medications Prior to Visit  Medication Sig Dispense Refill   Bimatoprost (LUMIGAN OP) Apply to eye.     Blood Glucose Calibration (ACCU-CHEK GUIDE CONTROL) LIQD 1 Act by In Vitro route 2 (two) times daily. 1 each 2   Blood Glucose Monitoring Suppl (ACCU-CHEK GUIDE ME) w/Device KIT 1 Act by Does not apply route 2 (two) times daily. 2 kit 0   cyclobenzaprine (FLEXERIL) 5 MG tablet Take 1 tablet (5 mg total) by mouth 3 (three) times daily as needed for muscle spasms. 270 tablet 0   eszopiclone (LUNESTA) 2 MG TABS tablet Take 1 tablet (2 mg total) by mouth at bedtime as needed for sleep. Take immediately before bedtime 90 tablet 0   fluocinonide cream (LIDEX) 0.05 % Apply 1 application. topically 2 (two) times daily. 60 g 1   glucose blood (ACCU-CHEK GUIDE) test strip 1 each by Other route 2 (two) times daily. Use as instructed 100 each 5   indapamide (LOZOL) 1.25 MG tablet Take 1 tablet (1.25 mg total) by mouth daily. 90  tablet 0   losartan (COZAAR) 100 MG tablet Take 1 tablet (100 mg total) by mouth daily. 90 tablet 0   nabumetone (RELAFEN) 500 MG tablet Take 1 tablet (500 mg total) by mouth 2 (two) times daily as needed. 180 tablet 0   pantoprazole (PROTONIX) 40 MG tablet Take 1 tablet (40 mg total) by mouth daily. 90 tablet 3   potassium chloride SA (KLOR-CON M) 20 MEQ tablet Take 1 tablet (20 mEq total) by mouth 2 (two) times daily. 180 tablet 0   Turmeric 500 MG CAPS Take 1 capsule by mouth daily. 90 capsule 1   XIIDRA 5 % SOLN Apply 1 drop to eye 2 (two) times daily.     Iron, Ferrous Sulfate, 325 (65 Fe) MG TABS Take 325 mg by mouth daily. 180 tablet 0   lidocaine (LIDODERM) 5 % Place 1 patch onto the skin daily. Remove & Discard patch within 12 hours or as directed by MD 90 patch 1   rosuvastatin (CRESTOR) 20 MG tablet Take 1 tablet (20 mg total) by mouth daily. 90 tablet 1   No facility-administered medications prior to visit.    ROS Review of Systems  Constitutional:  Negative for appetite change, fatigue and fever.  HENT: Negative.    Eyes: Negative.  Negative for visual disturbance.  Respiratory:  Negative for chest tightness, shortness of breath and wheezing.   Cardiovascular:  Negative for  chest pain, palpitations and leg swelling.  Gastrointestinal:  Negative for abdominal pain, constipation, diarrhea, nausea and vomiting.  Endocrine: Negative.   Genitourinary: Negative.  Negative for difficulty urinating.  Musculoskeletal:  Positive for arthralgias and back pain. Negative for gait problem and myalgias.  Skin:  Negative for color change.  Neurological: Negative.  Negative for dizziness, weakness and light-headedness.  Hematological:  Negative for adenopathy. Does not bruise/bleed easily.  Psychiatric/Behavioral: Negative.      Objective:  BP (!) 142/78 (BP Location: Right Arm, Patient Position: Sitting, Cuff Size: Large)   Pulse 80   Ht 5\' 6"  (1.676 m)   Wt 183 lb (83 kg)   BMI  29.54 kg/m   BP Readings from Last 3 Encounters:  01/08/23 (!) 142/78  06/22/22 137/69  05/11/22 136/76    Wt Readings from Last 3 Encounters:  01/08/23 183 lb (83 kg)  01/07/23 183 lb (83 kg)  06/22/22 174 lb (78.9 kg)    Physical Exam Constitutional:      Appearance: Normal appearance.  HENT:     Mouth/Throat:     Mouth: Mucous membranes are moist.  Eyes:     General: No scleral icterus. Cardiovascular:     Rate and Rhythm: Normal rate and regular rhythm.     Heart sounds: Murmur heard.     Systolic murmur is present with a grade of 1/6.     No diastolic murmur is present.     No friction rub. No gallop.     Comments: EKG --- NSR, 62 bpm LBBB is new No LVH Pulmonary:     Effort: Pulmonary effort is normal.     Breath sounds: No stridor. No wheezing, rhonchi or rales.  Abdominal:     General: Abdomen is flat.     Palpations: There is no mass.     Tenderness: There is no abdominal tenderness. There is no guarding.     Hernia: No hernia is present.  Musculoskeletal:        General: No swelling.     Cervical back: Neck supple.     Right lower leg: No edema.     Left lower leg: No edema.  Lymphadenopathy:     Cervical: No cervical adenopathy.  Skin:    General: Skin is warm and dry.     Findings: No rash.  Neurological:     General: No focal deficit present.     Mental Status: She is alert.  Psychiatric:        Mood and Affect: Mood normal.        Behavior: Behavior normal.     Lab Results  Component Value Date   WBC 3.6 (L) 01/08/2023   HGB 12.3 01/08/2023   HCT 38.4 01/08/2023   PLT 238.0 01/08/2023   GLUCOSE 89 01/08/2023   CHOL 158 01/08/2023   TRIG 77.0 01/08/2023   HDL 67.60 01/08/2023   LDLCALC 75 01/08/2023   ALT 14 05/11/2022   AST 17 05/11/2022   NA 139 01/08/2023   K 3.7 01/08/2023   CL 100 01/08/2023   CREATININE 0.82 01/08/2023   BUN 19 01/08/2023   CO2 30 01/08/2023   TSH 1.42 01/08/2023   HGBA1C 6.4 01/08/2023   MICROALBUR  <0.7 01/08/2023    MR CERVICAL SPINE WO CONTRAST  Result Date: 07/15/2022 CLINICAL DATA:  Cervical radiculopathy, discomfort with turning the head EXAM: MRI CERVICAL SPINE WITHOUT CONTRAST TECHNIQUE: Multiplanar, multisequence MR imaging of the cervical spine was performed. No intravenous contrast  was administered. COMPARISON:  None Available. FINDINGS: Alignment: Physiologic. Vertebrae: No acute fracture, evidence of discitis, or aggressive bone lesion. Cord: Normal signal and morphology. Posterior Fossa, vertebral arteries, paraspinal tissues: Posterior fossa demonstrates no focal abnormality. Vertebral artery flow voids are maintained. Paraspinal soft tissues are unremarkable. Disc levels: Discs: Degenerative disease with disc height loss at C3-4, C4-5, C5-6 and to lesser extent C7-T1, T1-2. C2-3: Minimal broad-based disc bulge. Mild bilateral facet arthropathy. No foraminal or central canal stenosis. C3-4: Broad-based disc osteophyte complex mildly flattening the ventral cervical spinal cord. Moderate bilateral facet arthropathy. Severe right and moderate-severe left foraminal stenosis. Moderate spinal stenosis. C4-5: Broad-based disc bulge. Moderate left and mild right facet arthropathy. Severe bilateral foraminal stenosis. Mild spinal stenosis. C5-6: Broad-based disc bulge. Mild bilateral facet arthropathy. Bilateral uncovertebral degenerative changes. Moderate-severe left and severe right foraminal stenosis. Mild spinal stenosis. C6-7: Mild broad-based disc bulge. Mild bilateral facet arthropathy. No foraminal stenosis. No spinal stenosis. C7-T1: Broad-based disc bulge. Moderate bilateral facet arthropathy. Mild left foraminal stenosis. Mild right foraminal stenosis. No spinal stenosis. T1-2: Mild broad-based disc bulge. Moderate bilateral facet arthropathy. Mild right foraminal stenosis. Moderate left foraminal stenosis. IMPRESSION: 1. Diffuse cervical spine spondylosis as described above. 2. No acute  osseous injury of the cervical spine. Electronically Signed   By: Elige Ko M.D.   On: 07/15/2022 12:03   MR LUMBAR SPINE WO CONTRAST  Result Date: 07/15/2022 CLINICAL DATA:  No known injury. Low back pain with bilateral leg weakness for 50 years. EXAM: MRI LUMBAR SPINE WITHOUT CONTRAST TECHNIQUE: Multiplanar, multisequence MR imaging of the lumbar spine was performed. No intravenous contrast was administered. COMPARISON:  None Available. FINDINGS: Segmentation:  Standard. Alignment:  Physiologic. Vertebrae: No acute fracture, evidence of discitis, or aggressive bone lesion. Conus medullaris and cauda equina: Conus extends to the L1 level. Conus and cauda equina appear normal. Paraspinal and other soft tissues: No acute paraspinal abnormality. Disc levels: Disc spaces: Degenerative disease with disc height loss at L1-2, L2-3, L3-4, L4-5 and L5-S1. T12-L1: Mild broad-based disc bulge. No foraminal or central canal stenosis. L1-L2: Mild broad-based disc bulge. Mild bilateral facet arthropathy. Mild bilateral foraminal stenosis. No spinal stenosis. L2-L3: Broad-based disc bulge. Mild bilateral facet arthropathy. Mild spinal stenosis. Mild bilateral foraminal stenosis. L3-L4: Broad-based disc osteophyte complex. Mild spinal stenosis. Mild bilateral facet arthropathy. Moderate right foraminal stenosis. No left foraminal stenosis. L4-L5: Mild broad-based disc osteophyte complex. Mild bilateral foraminal stenosis. No spinal stenosis. L5-S1: Mild broad-based disc bulge with a broad left foraminal disc protrusion. No spinal stenosis. Mild bilateral foraminal stenosis. IMPRESSION: 1. Diffuse lumbar spine spondylosis as described above. 2. No acute osseous injury of the lumbar spine. Electronically Signed   By: Elige Ko M.D.   On: 07/15/2022 11:32    Assessment & Plan:   Type II diabetes mellitus with manifestations (HCC) - Her blood sugar is well controlled. -     Microalbumin / creatinine urine ratio;  Future -     Hemoglobin A1c; Future -     Basic metabolic panel; Future -     HM Diabetes Foot Exam -     AMB Referral to Pharmacy Medication Management  Hyperlipidemia with target LDL less than 100 - LDL goal achieved. Doing well on the statin  -     Lipid panel; Future -     TSH; Future -     Rosuvastatin Calcium; Take 1 tablet (20 mg total) by mouth daily.  Dispense: 90 tablet; Refill: 1  Iron  deficiency anemia secondary to inadequate dietary iron intake -     CBC with Differential/Platelet; Future -     Fusion Plus; Take 1 capsule by mouth daily.  Dispense: 90 capsule; Refill: 0  Stage 3a chronic kidney disease (HCC)- Renal function is stable. -     Microalbumin / creatinine urine ratio; Future -     Urinalysis, Routine w reflex microscopic; Future -     Basic metabolic panel; Future  Primary osteoarthritis involving multiple joints  Hypertrophic toenail -     Ambulatory referral to Podiatry  Essential hypertension- Her BP is well controlled. -     EKG 12-Lead  Grade 1 out of 6 intensity murmur -     EKG 12-Lead  Abnormal electrocardiogram (ECG) (EKG) -     EKG 12-Lead  Insomnia secondary to chronic pain -     Lidocaine; Place 1 patch onto the skin daily. Remove & Discard patch within 12 hours or as directed by MD  Dispense: 90 patch; Refill: 1  Chronic bilateral low back pain without sciatica -     Lidocaine; Place 1 patch onto the skin daily. Remove & Discard patch within 12 hours or as directed by MD  Dispense: 90 patch; Refill: 1  Encounter for general adult medical examination with abnormal findings - Exam completed, labs reviewed, vaccines reviewed and updated, no cancer screenings indicated, pt ed material was given.   Left bundle branch block (LBBB) on electrocardiogram -     Ambulatory referral to Cardiology  Other orders -     Boostrix; Inject 0.5 mLs into the muscle once for 1 dose.  Dispense: 0.5 mL; Refill: 0     Follow-up: Return in about 6 months  (around 07/09/2023).  Sanda Linger, MD

## 2023-01-11 ENCOUNTER — Encounter: Payer: Self-pay | Admitting: Internal Medicine

## 2023-01-11 ENCOUNTER — Telehealth: Payer: Self-pay

## 2023-01-11 MED ORDER — CYCLOBENZAPRINE HCL 5 MG PO TABS: 5.0000 mg | ORAL_TABLET | Freq: Three times a day (TID) | ORAL | 1 refills | Status: AC | PRN

## 2023-01-11 MED ORDER — LOSARTAN POTASSIUM 100 MG PO TABS
100.0000 mg | ORAL_TABLET | Freq: Every day | ORAL | 1 refills | Status: DC
Start: 2023-01-11 — End: 2023-08-30

## 2023-01-11 MED ORDER — ESZOPICLONE 2 MG PO TABS
2.0000 mg | ORAL_TABLET | Freq: Every evening | ORAL | 1 refills | Status: DC | PRN
Start: 2023-01-11 — End: 2023-07-14

## 2023-01-11 MED ORDER — POTASSIUM CHLORIDE CRYS ER 20 MEQ PO TBCR: 20.0000 meq | EXTENDED_RELEASE_TABLET | Freq: Two times a day (BID) | ORAL | 1 refills | Status: DC

## 2023-01-11 MED ORDER — INDAPAMIDE 1.25 MG PO TABS
1.2500 mg | ORAL_TABLET | Freq: Every day | ORAL | 1 refills | Status: DC
Start: 2023-01-11 — End: 2023-02-17

## 2023-01-11 NOTE — Addendum Note (Signed)
Addended by: Darryll Capers on: 01/11/2023 08:51 AM   Modules accepted: Orders

## 2023-01-11 NOTE — Progress Notes (Signed)
   Care Guide Note  01/11/2023 Name: Alexandria [name] MRN: [id number] DOB: 1940/08/20  Referred by: Etta Grandchild, MD Reason for referral : Care Coordination (Outreach to schedule with Pharm d )   Alexandria [name] is a 82 y.o. year old female who is a primary care patient of Etta Grandchild, MD. [name] was referred to the pharmacist for assistance related to DM.    An unsuccessful telephone outreach was attempted today to contact the patient who was referred to the pharmacy team for assistance with medication management. Additional attempts will be made to contact the patient.   Penne Lash, RMA Care Guide [facility]  [place], [place] [address] Direct Dial: [phone] [email or web address]

## 2023-01-11 NOTE — Progress Notes (Signed)
   Care Guide Note  01/11/2023 Name: ALAYAH KNOUFF MRN: [id number] DOB: Dec 04, 1940  Referred by: Etta Grandchild, MD Reason for referral : Care Coordination (Outreach to schedule with Pharm d )   MAXENE BYINGTON is a 81 y.o. year old female who is a primary care patient of Etta Grandchild, MD. [name] was referred to the pharmacist for assistance related to HTN and DM.    Successful contact was made with the patient to discuss pharmacy services including being ready for the pharmacist to call at least 5 minutes before the scheduled appointment time, to have medication bottles and any blood sugar or blood pressure readings ready for review. The patient agreed to meet with the pharmacist via with the pharmacist via telephone visit on (date/time).  01/13/2023  Penne Lash, RMA Care Guide [facility]  [place], [place] [address] Direct Dial: [phone] [email or web address]

## 2023-01-11 NOTE — Addendum Note (Signed)
Addended by: Etta Grandchild on: 01/11/2023 08:11 AM   Modules accepted: Orders

## 2023-01-13 ENCOUNTER — Encounter: Payer: Self-pay | Admitting: Pharmacist

## 2023-01-13 ENCOUNTER — Other Ambulatory Visit: Payer: Medicare PPO | Admitting: Pharmacist

## 2023-01-13 DIAGNOSIS — E118 Type 2 diabetes mellitus with unspecified complications: Secondary | ICD-10-CM

## 2023-01-13 DIAGNOSIS — I10 Essential (primary) hypertension: Secondary | ICD-10-CM

## 2023-01-13 NOTE — Progress Notes (Signed)
01/13/2023 Name: Alexandria [name] MRN: [id number] DOB: 1941-03-04  Chief Complaint  Patient presents with   Diabetes   Medication Management    Alexandria [name] is a 82 y.o. year old female who presented for a telephone visit.   They were referred to the pharmacist by their PCP for assistance in managing diabetes.    Subjective:  Care Team: Primary Care Provider: Etta Grandchild, MD ; Next Scheduled Visit: none scheduled  Medication Access/Adherence  Current Pharmacy:  [id number] [place], [place] - [address] [place] [address] Phone: [phone] Fax: [phone]  [facility] - [place], [place] - [address] [place] [address] Phone: [phone] Fax: [phone]   Patient reports affordability concerns with their medications: No  Patient reports access/transportation concerns to their pharmacy: No  Patient reports adherence concerns with their medications:  No  Pt uses [facility] for medications therefore refill history is not up to date in chart.   Diabetes:  Current medications: diet/lifestyle controlled Medications tried in the past: none  Current glucose readings: averaging around 100 when she checks in the AM   Patient denies hypoglycemic s/sx including dizziness, shakiness, sweating.  Hypertension:  Current medications: indapamide 1.25 mg daily, losartan 100 mg daily, potassium chloride 20 mEq twice daily (patient only taking once daily)    Patient has a validated, automated, upper arm home BP cuff Current blood pressure readings readings: none recent  BP Readings from Last 3 Encounters:  01/08/23 (!) 142/78  06/22/22 137/69  05/11/22 136/76     Patient denies hypotensive s/sx including dizziness, lightheadedness.  Patient denies hypertensive symptoms including headache, chest pain, shortness of breath   Objective:  Lab Results  Component Value Date    HGBA1C 6.4 01/08/2023    Lab Results  Component Value Date   CREATININE 0.82 01/08/2023   BUN 19 01/08/2023   NA 139 01/08/2023   K 3.7 01/08/2023   CL 100 01/08/2023   CO2 30 01/08/2023    Lab Results  Component Value Date   CHOL 158 01/08/2023   HDL 67.60 01/08/2023   LDLCALC 75 01/08/2023   TRIG 77.0 01/08/2023   CHOLHDL 2 01/08/2023    Medications Reviewed Today     Reviewed by Bonita Quin, RPH (Pharmacist) on 01/13/23 at 0920  Med List Status: <None>   Medication Order Taking? Sig Documenting Provider Last Dose Status Informant  Bimatoprost (LUMIGAN OP) [id number] Yes Apply to eye. [provider] Taking Active   Blood Glucose Calibration (ACCU-CHEK GUIDE CONTROL) LIQD [id number]  1 Act by In Vitro route 2 (two) times daily. Etta Grandchild, MD  Active   Blood Glucose Monitoring Suppl (ACCU-CHEK GUIDE ME) w/Device KIT [id number]  1 Act by Does not apply route 2 (two) times daily. Etta Grandchild, MD  Active   cyclobenzaprine (FLEXERIL) 5 MG tablet [id number] Yes Take 1 tablet (5 mg total) by mouth 3 (three) times daily as needed for muscle spasms. Etta Grandchild, MD Taking Active   eszopiclone Alfonso Patten) 2 MG TABS tablet [id number] Yes Take 1 tablet (2 mg total) by mouth at bedtime as needed for sleep. Take immediately before bedtime Etta Grandchild, MD Taking Active   fluocinonide cream (LIDEX) 0.05 % [id number] Yes Apply 1 application. topically 2 (two) times daily. Etta Grandchild, MD Taking Active   glucose blood (ACCU-CHEK GUIDE) test strip [id number]  1 each by Other route 2 (two)  times daily. Use as instructed Etta Grandchild, MD  Active   indapamide (LOZOL) 1.25 MG tablet [id number] Yes Take 1 tablet (1.25 mg total) by mouth daily. Etta Grandchild, MD Taking Active   Iron-FA-B Cmp-C-Biot-Probiotic (FUSION PLUS) CAPS [id number] Yes Take 1 capsule by mouth daily. Etta Grandchild, MD Taking Active   lidocaine (LIDODERM) 5 % [id number] Yes Place 1 patch onto the  skin daily. Remove & Discard patch within 12 hours or as directed by MD Etta Grandchild, MD Taking Active   losartan (COZAAR) 100 MG tablet [id number] Yes Take 1 tablet (100 mg total) by mouth daily. Etta Grandchild, MD Taking Active   nabumetone (RELAFEN) 500 MG tablet [id number] Yes Take 1 tablet (500 mg total) by mouth 2 (two) times daily as needed. Etta Grandchild, MD Taking Active   pantoprazole (PROTONIX) 40 MG tablet [id number] Yes Take 1 tablet (40 mg total) by mouth daily. Meryl Dare, MD Taking Active   potassium chloride SA (KLOR-CON M) 20 MEQ tablet [id number] Yes Take 1 tablet (20 mEq total) by mouth 2 (two) times daily.  Patient taking differently: Take 20 mEq by mouth daily.   Etta Grandchild, MD Taking Active   rosuvastatin (CRESTOR) 20 MG tablet [id number] Yes Take 1 tablet (20 mg total) by mouth daily. Etta Grandchild, MD Taking Active   Turmeric 500 MG CAPS [id number] Yes Take 1 capsule by mouth daily. Etta Grandchild, MD Taking Active   XIIDRA 5 % Criss Rosales [id number] Yes Apply 1 drop to eye 2 (two) times daily. [provider] Taking Active              Assessment/Plan:   Diabetes: - Currently controlled, A1c goal <7% - Reviewed goal fasting, and goal 2 hour post prandial glucose - Recommend to check glucose occasionally or if symptomatic   Hypertension: - Currently uncontrolled, BP goal <130/80 - Recommended to check home blood pressure and heart rate several times over the next few weeks - Patient will send readings over portal. Will call if average BP is uncontrolled at home. - Potassium on lower limit of normal w/ patient taking one daily. Recommend getting high potassium foods regularly or take second potassium tablet daily.   Follow Up Plan: PRN  Arbutus Leas, PharmD, BCPS [facility] [phone]

## 2023-01-13 NOTE — Patient Instructions (Signed)
It was a pleasure speaking with you today!  Check your blood pressure occasionally over the next several weeks and send me your readings to review. We are wanting blood pressure to average under 130/80.  Feel free to call if there are questions or concerns.  Arbutus Leas, PharmD, BCPS [facility] [phone]

## 2023-01-14 ENCOUNTER — Encounter: Payer: Self-pay | Admitting: Podiatry

## 2023-01-14 ENCOUNTER — Ambulatory Visit: Payer: Medicare PPO | Admitting: Podiatry

## 2023-01-14 VITALS — Ht 66.0 in | Wt 183.0 lb

## 2023-01-14 DIAGNOSIS — M21612 Bunion of left foot: Secondary | ICD-10-CM | POA: Diagnosis not present

## 2023-01-14 DIAGNOSIS — E118 Type 2 diabetes mellitus with unspecified complications: Secondary | ICD-10-CM

## 2023-01-14 DIAGNOSIS — M2041 Other hammer toe(s) (acquired), right foot: Secondary | ICD-10-CM | POA: Diagnosis not present

## 2023-01-14 DIAGNOSIS — L84 Corns and callosities: Secondary | ICD-10-CM | POA: Diagnosis not present

## 2023-01-14 DIAGNOSIS — M2012 Hallux valgus (acquired), left foot: Secondary | ICD-10-CM

## 2023-01-14 DIAGNOSIS — L603 Nail dystrophy: Secondary | ICD-10-CM | POA: Diagnosis not present

## 2023-01-14 DIAGNOSIS — M25572 Pain in left ankle and joints of left foot: Secondary | ICD-10-CM | POA: Diagnosis not present

## 2023-01-14 DIAGNOSIS — M2042 Other hammer toe(s) (acquired), left foot: Secondary | ICD-10-CM | POA: Diagnosis not present

## 2023-01-14 NOTE — Patient Instructions (Signed)
VISIT SUMMARY:  During your visit, we discussed your left foot pain, bunion, hammer toe, and concerns about your left ankle. We also talked about your diabetic foot care. We have made some changes to your treatment plan to help manage these conditions and improve your comfort.  YOUR PLAN:  -SUBTALAR ARTHRITIS: This is a type of arthritis that affects the joint below your ankle, causing pain and limited movement. We will continue your current arthritis medication and may consider a cortisone injection if your pain gets worse. We will also provide a lace-up ankle brace to give your ankle more support and stability.  -HALLUX VALGUS DEFORMITY AND HAMMER TOE: These are foot conditions where your big toe points towards your other toes (bunion) and your second toe is bent or curled (hammer toe). We have cleaned up your second toenails and the callus on your left foot. You should continue using urea cream on the callus. We may need to consider surgery for the bunion and hammer toe if they start causing joint pain.  -DIABETIC FOOT CARE: This is about taking care of your feet to prevent complications from diabetes. Your blood sugar levels are well-controlled and your circulation is good. We will schedule an appointment for you to get fitted for diabetic shoes.  INSTRUCTIONS:  Please continue taking your arthritis medication as prescribed. Apply the urea cream to your callus as directed. We will contact you to schedule an appointment for fitting diabetic shoes. If your foot or ankle pain worsens, please contact our office immediately.

## 2023-01-14 NOTE — Progress Notes (Signed)
Subjective:  Patient ID: Alexandria [name], female    DOB: 30-Jul-1940,  MRN: [id number]  Chief Complaint  Patient presents with   Nail Problem    Build up on left foot second toe nail, pain in both feet, swelling on left foot and pt would like some diabetic shoes.   Foot Pain    Discussed the use of AI scribe software for clinical note transcription with the patient, who gave verbal consent to proceed.  History of Present Illness   The patient presents with left foot pain, localized to the area below the ankle. They describe the pain as being worse when the foot is in motion and when the ankle swells, which is a frequent occurrence. The patient also reports numbness in the top part of the foot. They take an arthritis pill in the morning and evening, which provides some relief, along with Tylenol.  In addition to the foot pain, the patient has a bunion and a hammer toe on the second toe of both feet. The second toenail has thickened due to the hammer toe, and the patient has tried to manage this with regular pedicures and self-care, but the thickening continues. The patient also has a large callus on the plantar medial left first MTP joint due to the bunion.  The patient also mentions a concern about their left ankle, which frequently swells and is painful. They express interest in a brace for support and stability.          Objective:    Physical Exam   MUSCULOSKELETAL: Pain and tenderness in the sinus tarsi and subtalar joint of the left foot with limited range of motion and pain. NEUROLOGICAL: Sensation intact. SKIN: Semi rigid hammer toe deformities of both second toes with thickening and dystrophy of the toenail. Hallux valgus deformity on the left, less so on the right. Large callus on the plantar medial aspect of the left first MTP joint.       No images are attached to the encounter.    Results   Procedure: Debridement of bilateral second toenail Description: The bilateral  second toenail was debrided in length and thickness with a sharp nail level.  Procedure: Debridement of callus on left first MTP joint Description: The callus on the left first MTP joint was debrided with a sharp scalpel to a comfortable level.  LABS A1c: 6.x (01/07/2023)      Assessment:   1. Hallux valgus with bunions of left foot   2. Hammertoe of left foot   3. Hammertoe of right foot   4. Pre-ulcerative calluses   5. Nail dystrophy   6. Sinus tarsi syndrome of left ankle      Plan:  Patient was evaluated and treated and all questions answered.  Assessment and Plan    Subtalar Arthritis They exhibit pain and tenderness in the sinus tarsi and subtalar joint of the left foot, accompanied by limited range of motion. We will continue their current arthritis medication and consider a cortisone injection if pain intensifies. Additionally, we will provide a lace-up ankle brace for enhanced support and stability.  Hallux Valgus Deformity and Hammer Toe They present with thickening and dystrophy of the toenails on both second toes, attributed to pressure from hammer toe, and a large callus on the plantar medial left first MTP joint due to a bunion. We debrided the bilateral second toenails and the callus on the left first MTP joint. They will continue using urea cream on the callus. We discussed  the potential need for surgical intervention for the bunion and hammer toe should joint pain develop.  Diabetic Foot Care They have good circulation and a controlled A1C. We will schedule an appointment for the fitting of diabetic shoes.          Return if symptoms worsen or fail to improve.

## 2023-01-22 ENCOUNTER — Encounter: Payer: Self-pay | Admitting: Adult Health

## 2023-01-22 ENCOUNTER — Ambulatory Visit (INDEPENDENT_AMBULATORY_CARE_PROVIDER_SITE_OTHER): Payer: Medicare PPO

## 2023-01-22 ENCOUNTER — Ambulatory Visit: Payer: Medicare PPO | Attending: Adult Health | Admitting: Adult Health

## 2023-01-22 VITALS — BP 142/58 | HR 57 | Ht 66.0 in | Wt 182.0 lb

## 2023-01-22 DIAGNOSIS — Z79899 Other long term (current) drug therapy: Secondary | ICD-10-CM

## 2023-01-22 DIAGNOSIS — I10 Essential (primary) hypertension: Secondary | ICD-10-CM | POA: Diagnosis not present

## 2023-01-22 DIAGNOSIS — R001 Bradycardia, unspecified: Secondary | ICD-10-CM | POA: Diagnosis not present

## 2023-01-22 DIAGNOSIS — R072 Precordial pain: Secondary | ICD-10-CM

## 2023-01-22 DIAGNOSIS — E7800 Pure hypercholesterolemia, unspecified: Secondary | ICD-10-CM

## 2023-01-22 DIAGNOSIS — Z01812 Encounter for preprocedural laboratory examination: Secondary | ICD-10-CM

## 2023-01-22 DIAGNOSIS — R42 Dizziness and giddiness: Secondary | ICD-10-CM

## 2023-01-22 DIAGNOSIS — R9439 Abnormal result of other cardiovascular function study: Secondary | ICD-10-CM

## 2023-01-22 DIAGNOSIS — R55 Syncope and collapse: Secondary | ICD-10-CM

## 2023-01-22 NOTE — Progress Notes (Unsigned)
Enrolled patient for a 14 day Zio XT monitor to be mailed to patients home  [place] to read

## 2023-01-22 NOTE — Patient Instructions (Signed)
Medication Instructions:  No changes *If you need a refill on your cardiac medications before your next appointment, please call your pharmacy*   Lab Work: No labs If you have labs (blood work) drawn today and your tests are completely normal, you will receive your results only by: MyChart Message (if you have MyChart) OR A paper copy in the mail If you have any lab test that is abnormal or we need to change your treatment, we will call you to review the results.   Testing/Procedures: Christena Deem- Long Term Monitor Instructions  Your physician has requested you wear a ZIO patch monitor for 14 days.  This is a single patch monitor. [employer] supplies one patch monitor per enrollment. Additional stickers are not available. Please do not apply patch if you will be having a Nuclear Stress Test,  Echocardiogram, Cardiac CT, MRI, or Chest Xray during the period you would be wearing the  monitor. The patch cannot be worn during these tests. You cannot remove and re-apply the  ZIO XT patch monitor.  Your ZIO patch monitor will be mailed 3 day USPS to your address on file. It may take 3-5 days  to receive your monitor after you have been enrolled.  Once you have received your monitor, please review the enclosed instructions. Your monitor  has already been registered assigning a specific monitor serial # to you.  Billing and Patient Assistance Program Information  We have supplied [employer] with any of your insurance information on file for billing purposes. [employer] offers a sliding scale Patient Assistance Program for patients that do not have  insurance, or whose insurance does not completely cover the cost of the ZIO monitor.  You must apply for the Patient Assistance Program to qualify for this discounted rate.  To apply, please call [employer] at [phone], select option 4, select option 2, ask to apply for  Patient Assistance Program. Meredeth Ide will ask your household income, and how many people   are in your household. They will quote your out-of-pocket cost based on that information.  [employer] will also be able to set up a 45-month, interest-free payment plan if needed.  Applying the monitor   Shave hair from upper left chest.  Hold abrader disc by orange tab. Rub abrader in 40 strokes over the upper left chest as  indicated in your monitor instructions.  Clean area with 4 enclosed alcohol pads. Let dry.  Apply patch as indicated in monitor instructions. Patch will be placed under collarbone on left  side of chest with arrow pointing upward.  Rub patch adhesive wings for 2 minutes. Remove white label marked "1". Remove the white  label marked "2". Rub patch adhesive wings for 2 additional minutes.  While looking in a mirror, press and release button in center of patch. A small green light will  flash 3-4 times. This will be your only indicator that the monitor has been turned on.  Do not shower for the first 24 hours. You may shower after the first 24 hours.  Press the button if you feel a symptom. You will hear a small click. Record Date, Time and  Symptom in the Patient Logbook.  When you are ready to remove the patch, follow instructions on the last 2 pages of Patient  Logbook. Stick patch monitor onto the last page of Patient Logbook.  Place Patient Logbook in the blue and white box. Use locking tab on box and tape box closed  securely. The blue and white box  has prepaid postage on it. Please place it in the mailbox as  soon as possible. Your physician should have your test results approximately 7 days after the  monitor has been mailed back to [facility].  Call [facility] Customer Care at [phone] if you have questions regarding  your ZIO XT patch monitor. Call them immediately if you see an orange light blinking on your  monitor.  If your monitor falls off in less than 4 days, contact our Monitor department at [phone].  If your monitor becomes loose or  falls off after 4 days call [employer] at [phone] for  suggestions on securing your monitor    Follow-Up: At [facility], you and your health needs are our priority.  As part of our continuing mission to provide you with exceptional heart care, we have created designated Provider Care Teams.  These Care Teams include your primary Cardiologist (physician) and Advanced Practice Providers (APPs -  Physician Assistants and Nurse Practitioners) who all work together to provide you with the care you need, when you need it.  We recommend signing up for the patient portal called "MyChart".  Sign up information is provided on this After Visit Summary.  MyChart is used to connect with patients for Virtual Visits (Telemedicine).  Patients are able to view lab/test results, encounter notes, upcoming appointments, etc.  Non-urgent messages can be sent to your provider as well.   To learn more about what you can do with MyChart, go to [email or web address].    Your next appointment:   4-6 week(s)  Provider:   Joni Reining, DNP, ANP

## 2023-01-22 NOTE — Progress Notes (Signed)
Cardiology Office Note:  .   Date:  01/22/2023  ID:  CARLISLE TIMOTHY, DOB [birth year], MRN [id number] PCP: Etta Grandchild, MD  [facility] Providers Cardiologist:  Dr. Antoine Poche }   History of Present Illness: .   UNICE KOELZER is a 82 y.o. female with hx of palpitations, palpitations, and HL. Cardiac CTA calcium score of 0, 08/07/2021. Last seen by Dr. Antoine Poche 07/24/2021.   She comes today with her daughter, Elita Quick, on the phone listening in during this visit.  She has had some complaints of squeezing spasm like substernal discomfort in her chest, relieved with Mylanta and rest.  Most recent was when she was sewing and noticed a squeezing tightness that moved from her epigastric area into her throat which was spasm-like.  She stated that she took Mylanta lay down and it has not reoccurred since.  She has he is intermittently.  Her daughter also reminds her that she had been complaining of dizziness especially when working outside in her yard.  But she has had some episodes of dizziness on and off for several months.  She denies syncopal episode, staggering, or falls associated with this.  She has been medically compliant.  She remains very active at home.  She usually takes her blood pressure for Singh in the morning.  She brings with her her recordings.  She has had some very low blood pressures in the morning of 92/56, 112/62.  She states sometimes she feels dizzy but not associated with position.  She states that she had 2 days where she was very lightheaded and dizzy and she rested and this resolved on its own.  ROS: As above otherwise negative.  Studies Reviewed: Marland Kitchen   EKG Interpretation Date/Time:  Friday January 22 2023 11:36:52 EDT Ventricular Rate:  57 PR Interval:  188 QRS Duration:  72 QT Interval:  418 QTC Calculation: 406 R Axis:   -13  Text Interpretation: Sinus bradycardia Minimal voltage criteria for LVH, may be normal variant ( R in aVL ) T wave abnormality, consider  anterior ischemia When compared with ECG of 02-Jun-2002 17:02, T wave inversion now evident in Anterior leads Confirmed by Joni Reining [phone]) on 01/22/2023 11:55:17 AM     Coronary CTA MPRESSION: 1. Coronary calcium score of 15.6. This was 39th percentile for age-, sex, and race-matched controls.   2.  Normal coronary origin with right dominance.   3.  Mild atherosclerosis.  CAD RADS 2.   4.  Recommend preventive therapy and risk factor modification.   5.  Consider non atherosclerotic causes of chest pain.  Physical Exam:   VS:  BP (!) 142/58 (BP Location: Left Arm, Patient Position: Sitting, Cuff Size: Normal)   Pulse (!) 57   Ht 5\' 6"  (1.676 m)   Wt 182 lb (82.6 kg)   SpO2 98%   BMI 29.38 kg/m    Wt Readings from Last 3 Encounters:  01/22/23 182 lb (82.6 kg)  01/14/23 183 lb (83 kg)  01/08/23 183 lb (83 kg)    GEN: Well nourished, well developed in no acute distress NECK: No JVD; No carotid bruits CARDIAC: RRR bradycardic, soft systolic murmurs heard best at the right sternal border, rubs, gallops RESPIRATORY:  Clear to auscultation without rales, wheezing or rhonchi  ABDOMEN: Soft, non-tender, non-distended EXTREMITIES:  No edema; No deformity   ASSESSMENT AND PLAN: .    Dizziness: This has been occurring intermittently with no associated syncopal episodes.  She is noticing it first  thing in the morning sometimes or when she is out working in her yard.  She is normally very active.  EKG revealed bradycardia.  I am going to place a 2-week ZIO monitor to evaluate for significant bradycardia, pauses, or arrhythmias causing her symptoms.  2.  Hypertension: Continues on losartan 100 mg daily.  May need to consider decreasing dose if no arrhythmias are found on ZIO monitor.  Will do orthostatic blood pressures on next office visit as I was limited on time concerning multiple questions from the patient and her daughter.  3.  Hypercholesterolemia: She remains on  rosuvastatin 20 mg daily.  The patient total cholesterol 158, HDL 67, LDL 75.  Completed 01/17/2023 by PCP.       Signed, Bettey Mare. Liborio Nixon, ANP, AACC

## 2023-01-26 DIAGNOSIS — R001 Bradycardia, unspecified: Secondary | ICD-10-CM

## 2023-01-26 DIAGNOSIS — Z01812 Encounter for preprocedural laboratory examination: Secondary | ICD-10-CM

## 2023-01-26 DIAGNOSIS — R9439 Abnormal result of other cardiovascular function study: Secondary | ICD-10-CM

## 2023-01-26 DIAGNOSIS — Z79899 Other long term (current) drug therapy: Secondary | ICD-10-CM

## 2023-01-26 DIAGNOSIS — R072 Precordial pain: Secondary | ICD-10-CM | POA: Diagnosis not present

## 2023-01-26 DIAGNOSIS — R55 Syncope and collapse: Secondary | ICD-10-CM

## 2023-02-04 ENCOUNTER — Ambulatory Visit
Admission: RE | Admit: 2023-02-04 | Discharge: 2023-02-04 | Disposition: A | Discharge status: Home / Self Care | Payer: Medicare PPO | Source: Ambulatory Visit | Attending: Internal Medicine | Admitting: Internal Medicine

## 2023-02-04 DIAGNOSIS — Z1231 Encounter for screening mammogram for malignant neoplasm of breast: Secondary | ICD-10-CM | POA: Diagnosis not present

## 2023-02-05 ENCOUNTER — Ambulatory Visit: Payer: Medicare PPO

## 2023-02-05 ENCOUNTER — Telehealth: Payer: Self-pay | Admitting: Podiatry

## 2023-02-05 DIAGNOSIS — M2041 Other hammer toe(s) (acquired), right foot: Secondary | ICD-10-CM

## 2023-02-05 DIAGNOSIS — M2012 Hallux valgus (acquired), left foot: Secondary | ICD-10-CM

## 2023-02-05 DIAGNOSIS — L84 Corns and callosities: Secondary | ICD-10-CM

## 2023-02-05 DIAGNOSIS — M2042 Other hammer toe(s) (acquired), left foot: Secondary | ICD-10-CM

## 2023-02-05 DIAGNOSIS — E118 Type 2 diabetes mellitus with unspecified complications: Secondary | ICD-10-CM

## 2023-02-05 NOTE — Telephone Encounter (Signed)
Patient came in today for diabetic shoe fitting with Nicki [place]. Patient was asking if you can send an order for diabetic compression socks to [facility] in [place].

## 2023-02-05 NOTE — Progress Notes (Signed)
Patient presents to the office today for diabetic shoe and insole measuring.  Patient was measured with brannock device to determine size and width for 1 pair of extra depth shoes and foam casted for 1pair of inserts and using scans from last year as patient was happy with them but onlky wants one pair this year as she has another pair left from last yr   Documentation of medical necessity will be sent to patient's treating diabetic doctor to verify and sign.   Patient's diabetic provider: Sanda Linger MD  Shoes and insoles will be ordered at that time and patient will be notified for an appointment for fitting when they arrive.   Shoe size (per patient): 9.5W Brannock measurement: 9.5 Patient shoe selection- Shoe choice:   [id number] / [id number] Shoe size ordered: 9.5WD

## 2023-02-08 MED ORDER — JOBST 20-30MMHG COMPRESSION SM MISC: 0 refills | Status: DC

## 2023-02-08 NOTE — Addendum Note (Signed)
Addended byLilian Kapur, Tiauna Whisnant R on: 02/08/2023 09:55 AM   Modules accepted: Orders

## 2023-02-10 DIAGNOSIS — R072 Precordial pain: Secondary | ICD-10-CM | POA: Diagnosis not present

## 2023-02-10 DIAGNOSIS — R9439 Abnormal result of other cardiovascular function study: Secondary | ICD-10-CM | POA: Diagnosis not present

## 2023-02-11 ENCOUNTER — Ambulatory Visit: Payer: Medicare PPO | Admitting: Internal Medicine

## 2023-02-15 ENCOUNTER — Telehealth: Payer: Self-pay

## 2023-02-15 NOTE — Telephone Encounter (Addendum)
Called patient regarding results. Patient had understanding of results.----- Message from Joni Reining sent at 02/14/2023  1:18 PM EST ----- I have reviewed the ZIO monitor results.  There are no pauses or significant arrhythmias which would cause her to have a syncopal episode.  Should continue with PCP for further evaluation and management.  Monitor results are reassuring.

## 2023-02-17 ENCOUNTER — Other Ambulatory Visit: Payer: Self-pay | Admitting: Internal Medicine

## 2023-02-17 DIAGNOSIS — I10 Essential (primary) hypertension: Secondary | ICD-10-CM

## 2023-02-18 DIAGNOSIS — H16223 Keratoconjunctivitis sicca, not specified as Sjogren's, bilateral: Secondary | ICD-10-CM | POA: Diagnosis not present

## 2023-02-18 DIAGNOSIS — E119 Type 2 diabetes mellitus without complications: Secondary | ICD-10-CM | POA: Diagnosis not present

## 2023-02-18 DIAGNOSIS — H401131 Primary open-angle glaucoma, bilateral, mild stage: Secondary | ICD-10-CM | POA: Diagnosis not present

## 2023-02-19 ENCOUNTER — Ambulatory Visit: Payer: Medicare PPO | Admitting: Adult Health

## 2023-03-12 NOTE — Progress Notes (Deleted)
  Cardiology Office Note:  .   Date:  03/12/2023  ID:  ZULIANA DUPONT, DOB March 17, 1941, MRN [id number] PCP: Etta Grandchild, MD  [facility] Providers Cardiologist:  Rollene Rotunda, MD {  History of Present Illness: .   MEGANA MESNARD is a 82 y.o. female with hx of palpitations, palpitations, and HL. Cardiac CTA calcium score of 0, 08/07/2021  On last office visit she had  complaints of some episodes of dizziness on and off for several months A 2 week Zio monitor was placed.There were no pauses or significant arrhythmias. Should have orthostatic BP this visit if she is still symptomatic.   ROS: ***  Studies Reviewed: .        *** EKG Interpretation Date/Time:    Ventricular Rate:    PR Interval:    QRS Duration:    QT Interval:    QTC Calculation:   R Axis:      Text Interpretation:      Physical Exam:   VS:  There were no vitals taken for this visit.   Wt Readings from Last 3 Encounters:  01/22/23 182 lb (82.6 kg)  01/14/23 183 lb (83 kg)  01/08/23 183 lb (83 kg)    GEN: Well nourished, well developed in no acute distress NECK: No JVD; No carotid bruits CARDIAC: ***RRR, no murmurs, rubs, gallops RESPIRATORY:  Clear to auscultation without rales, wheezing or rhonchi  ABDOMEN: Soft, non-tender, non-distended EXTREMITIES:  No edema; No deformity   ASSESSMENT AND PLAN: .   ***    {Are you ordering a CV Procedure (e.g. stress test, cath, DCCV, TEE, etc)?   Press F2        :[id number]}    Signed, Bettey Mare. Liborio Nixon, ANP, AACC

## 2023-03-18 ENCOUNTER — Telehealth: Payer: Self-pay | Admitting: *Deleted

## 2023-03-18 ENCOUNTER — Ambulatory Visit: Payer: Medicare PPO | Admitting: Adult Health

## 2023-03-18 NOTE — Telephone Encounter (Signed)
Spoke with patient to explain appointment today has been canceled due to provider illness. Pt verbalizes understanding. Will request that scheduling team call her later today to reschedule. Pt in agreement with plan.

## 2023-03-19 NOTE — Telephone Encounter (Signed)
Patient has been rescheduled for an OV on 04/16/23.

## 2023-04-15 NOTE — Progress Notes (Signed)
Cardiology Office Note:  .   Date:  04/16/2023  ID:  JULIAETTE AGATE, DOB 1940-04-10, MRN [id number] PCP: Etta Grandchild, MD  [facility] Providers Cardiologist:  Rollene Rotunda, MD }   History of Present Illness: .   Alexandria [name] is a 83 y.o. female with hx of palpitations, palpitations, and HL. Cardiac CTA calcium score of 0, 08/07/2021. She was last seen in the office with her daughter, Elita Quick, on the phone assisting in the visit history. Her main complaint at that visit was dizziness and was noted to have hypotension on home BP recordings. She was also bradycardic on EKG.  I ordered a Zio monitor to evaluate for arrhythmias and pauses.    Zio monitor revealed NSR, frequent SVT the longest run 2 minutes 55 seconds with average rate of 176 bpm. She is on losartan 100 mg daily. Will need to change her to a low dose BB and decrease the losartan to help with the rapid HR.  She comes today symptomatic.  Multiple questions concerning the ZIO monitor.  She denies any dizziness, near-syncope, but does feel little bit of discomfort in her chest with exertion.  She also has GERD type symptoms.  ROS: As above otherwise negative  Studies Reviewed: Marland Kitchen     Coronary CTA MPRESSION: 1. Coronary calcium score of 15.6. This was 39th percentile for age-, sex, and race-matched controls.   2.  Normal coronary origin with right dominance.   3.  Mild atherosclerosis.  CAD RADS 2.   4.  Recommend preventive therapy and risk factor modification.   5.  Consider non atherosclerotic causes of chest pain.   Zio Monitor Normal sinus rhythm Frequent SVT The longest run was 2 min and 55 seconds with an average rate of 174.      Physical Exam:   VS:  BP 116/70   Pulse 76   Ht 5\' 6"  (1.676 m)   Wt 186 lb 6.4 oz (84.6 kg)   SpO2 96%   BMI 30.09 kg/m    Wt Readings from Last 3 Encounters:  04/16/23 186 lb 6.4 oz (84.6 kg)  01/22/23 182 lb (82.6 kg)  01/14/23 183 lb (83 kg)    GEN: Well nourished,  well developed in no acute distress NECK: No JVD; No carotid bruits CARDIAC: RRR, no murmurs, rubs, gallops RESPIRATORY:  Clear to auscultation without rales, wheezing or rhonchi  ABDOMEN: Soft, non-tender, non-distended EXTREMITIES:  No edema; No deformity   ASSESSMENT AND PLAN: .   Abnormal ZIO monitor: This revealed frequent SVT with heart rates up to 174 bpm.  On review of the monitor report it appears that she has a lot of this going on between 11 and 11:30 AM each day.  She states she is usually rushing around trying to get to a class she goes to around 1:00.  She does not feel badly or is aware of the racing heart rate but she states she feels very tired by the afternoon.  I will plan on starting her on low-dose metoprolol tartrate 12.5 mg daily.  I will not give her an evening dose as her heart rates in the evening are normal to bradycardic.  As result of additional medication, I will reduce her losartan to 50 mg daily to avoid hypotension.  She is asking that we send this to the [place] so that can be filled.  She will see [place] back in 6 weeks to let us know how she is feeling on  the medication.  I told her that if she is unable to tolerate the beta-blocker feeling worse on it she should stop it immediately and let us know.  2.  Hypertension: Blood pressure is normal today.  Making adjustments as above to avoid hypotension with addition of low-dose metoprolol.  She is aware to decrease her losartan from 100 mg daily to 50 mg which she will take at nighttime to avoid a cumulative effect during the day by the addition of metoprolol during daytime hours when her heart rate is elevated most frequently per ZIO monitor report.  3.  Type 2 diabetes: She is being followed by primary care.  Defer concerning changes in medication or adjustments.         Signed, Bettey Mare. Liborio Nixon, ANP, AACC

## 2023-04-16 ENCOUNTER — Encounter: Payer: Self-pay | Admitting: Adult Health

## 2023-04-16 ENCOUNTER — Ambulatory Visit: Payer: Medicare Other | Attending: Adult Health | Admitting: Adult Health

## 2023-04-16 VITALS — BP 116/70 | HR 76 | Ht 66.0 in | Wt 186.4 lb

## 2023-04-16 DIAGNOSIS — I471 Supraventricular tachycardia, unspecified: Secondary | ICD-10-CM

## 2023-04-16 DIAGNOSIS — E118 Type 2 diabetes mellitus with unspecified complications: Secondary | ICD-10-CM | POA: Diagnosis not present

## 2023-04-16 DIAGNOSIS — I10 Essential (primary) hypertension: Secondary | ICD-10-CM | POA: Diagnosis not present

## 2023-04-16 MED ORDER — METOPROLOL TARTRATE 25 MG PO TABS: 12.5000 mg | ORAL_TABLET | Freq: Every morning | ORAL | 3 refills | Status: DC

## 2023-04-16 NOTE — Patient Instructions (Signed)
Medication Instructions:  Decrease Losartan from 100 mg  to 50 mg 9 Take 1/2 of 100 mg tablet Daily). Start Metoprolol Tartrate 12.5 mg ( Take 1/2 of 25 mg Tablet In The Morning). *If you need a refill on your cardiac medications before your next appointment, please call your pharmacy*   Lab Work: No Labs If you have labs (blood work) drawn today and your tests are completely normal, you will receive your results only by: MyChart Message (if you have MyChart) OR A paper copy in the mail If you have any lab test that is abnormal or we need to change your treatment, we will call you to review the results.   Testing/Procedures: No Testing   Follow-Up: At [facility], you and your health needs are our priority.  As part of our continuing mission to provide you with exceptional heart care, we have created designated Provider Care Teams.  These Care Teams include your primary Cardiologist (physician) and Advanced Practice Providers (APPs -  Physician Assistants and Nurse Practitioners) who all work together to provide you with the care you need, when you need it.  We recommend signing up for the patient portal called "MyChart".  Sign up information is provided on this After Visit Summary.  MyChart is used to connect with patients for Virtual Visits (Telemedicine).  Patients are able to view lab/test results, encounter notes, upcoming appointments, etc.  Non-urgent messages can be sent to your provider as well.   To learn more about what you can do with MyChart, go to [email or web address].    Your next appointment:   6 week(s)  Provider:   Joni Reining, DNP, ANP

## 2023-04-22 ENCOUNTER — Telehealth: Payer: Self-pay

## 2023-04-22 ENCOUNTER — Other Ambulatory Visit: Payer: Self-pay | Admitting: Internal Medicine

## 2023-04-22 DIAGNOSIS — D508 Other iron deficiency anemias: Secondary | ICD-10-CM

## 2023-04-22 NOTE — Telephone Encounter (Signed)
Spoke to pt about her DM shoes how they were delievered too [place]. Will reach out once me and tricia get in touch with [employer].

## 2023-05-28 ENCOUNTER — Ambulatory Visit: Payer: Medicare Other | Admitting: Adult Health

## 2023-06-04 ENCOUNTER — Other Ambulatory Visit: Payer: Self-pay | Admitting: Internal Medicine

## 2023-06-04 DIAGNOSIS — E118 Type 2 diabetes mellitus with unspecified complications: Secondary | ICD-10-CM

## 2023-06-24 ENCOUNTER — Other Ambulatory Visit: Payer: Self-pay | Admitting: Internal Medicine

## 2023-06-24 DIAGNOSIS — G8929 Other chronic pain: Secondary | ICD-10-CM

## 2023-06-25 ENCOUNTER — Other Ambulatory Visit: Payer: Medicare PPO

## 2023-07-05 ENCOUNTER — Ambulatory Visit: Payer: Medicare Other | Admitting: Adult Health

## 2023-07-08 DIAGNOSIS — H401131 Primary open-angle glaucoma, bilateral, mild stage: Secondary | ICD-10-CM | POA: Diagnosis not present

## 2023-07-08 DIAGNOSIS — H16223 Keratoconjunctivitis sicca, not specified as Sjogren's, bilateral: Secondary | ICD-10-CM | POA: Diagnosis not present

## 2023-07-08 DIAGNOSIS — E119 Type 2 diabetes mellitus without complications: Secondary | ICD-10-CM | POA: Diagnosis not present

## 2023-07-09 ENCOUNTER — Ambulatory Visit: Attending: Emergency Medicine | Admitting: Emergency Medicine

## 2023-07-09 ENCOUNTER — Encounter: Payer: Self-pay | Admitting: Emergency Medicine

## 2023-07-09 VITALS — BP 120/48 | HR 69 | Ht 66.0 in | Wt 181.8 lb

## 2023-07-09 DIAGNOSIS — I2510 Atherosclerotic heart disease of native coronary artery without angina pectoris: Secondary | ICD-10-CM | POA: Diagnosis not present

## 2023-07-09 DIAGNOSIS — E118 Type 2 diabetes mellitus with unspecified complications: Secondary | ICD-10-CM | POA: Diagnosis not present

## 2023-07-09 DIAGNOSIS — I471 Supraventricular tachycardia, unspecified: Secondary | ICD-10-CM | POA: Diagnosis not present

## 2023-07-09 DIAGNOSIS — E785 Hyperlipidemia, unspecified: Secondary | ICD-10-CM

## 2023-07-09 DIAGNOSIS — I10 Essential (primary) hypertension: Secondary | ICD-10-CM | POA: Diagnosis not present

## 2023-07-09 NOTE — Progress Notes (Signed)
 Cardiology Office Note:    Date:  07/09/2023  ID:  Alexandria [name], DOB 03-17-1941, MRN [id number] PCP: Etta Grandchild, MD  [facility] Providers Cardiologist:  Rollene Rotunda, MD       Patient Profile:      Chief Complaint: 71-month follow-up for SVT History of Present Illness:  Alexandria [name] is a 83 y.o. female with visit-pertinent history of palpitations, hypertension, hypercholesterolemia, SVT, type 2 diabetes  Her echocardiogram prior to her initial visit on 07/21/2021 showed LVEF 55-60%, no RWMA, normal diastolic parameters, RV function and size normal, normal PASP, trivial MR, aortic valve sclerosis with no AS.  Lexiscan Myoview prior to her initial visit on 07/21/2021 was low risk.  There was small defect with mild reduction in uptake present in the apical to mid inferior location that is partially reversible.  Patient established with cardiology service on 07/24/2021 for evaluation of shortness of breath.  She noted she had had chest discomfort on and off for quite some time with worsening shortness of breath and dyspnea on exertion.  Coronary CTA was ordered and completed on 08/07/2021 showing coronary calcium score of 15.6 (39th percentile) with mild atherosclerosis, CAD RADS 2.    She was seen in clinic on 01/22/2023.  She noted intermittent dizziness with no associated syncope.  Dizziness was noticed first thing in the morning when working on the yard.  ZIO monitor was placed showing normal sinus rhythm with frequent SVT with longest run 2 minutes and 55 seconds with an average rate of 174.  She was seen in office for follow-up on 04/16/2023.  She noted her dizziness to be improved.  Her losartan was decreased from 100 to 50 mg.  She was started on metoprolol tartrate 12.5 mg once daily given frequent SVT on monitor.  She was to follow back up in 6 weeks.   Discussed the use of AI scribe software for clinical note transcription with the patient, who gave verbal consent to  proceed.  History of Present Illness Alexandria [name] presents today for a follow-up visit.  She is without any acute cardiovascular concerns or complaints today.  She reports that she has decreased her caffeine intake and is now drinking decaf coffee.  She maintains adequate hydration.  She notes that since she started her metoprolol the frequency of her episodes has decreased drastically.  She notes that she has only had 1 episode of a fast heart rate since her last office visit.  She is encouraged and happy with the improvement in her symptoms.  In addition to her heart condition, Alexandria [name] also has a history of diabetes, which she manages through diet. She reports that her blood sugar levels are consistently below 110.   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  She denies chest pain, shortness of breath, lower extremity edema, fatigue, melena, hematuria, hemoptysis, diaphoresis, weakness, presyncope, syncope, orthopnea, and PND.  Review of systems:  Please see the history of present illness. All other systems are reviewed and otherwise negative.     Home Medications:    Current Meds  Medication Sig   ACCU-CHEK GUIDE TEST test strip USE 1 STRIP TO TEST TWICE A DAY - (KEEP UNUSED STRIPS IN ORIGINAL SEALED CONTAINER BETWEEN USES)   Bimatoprost (LUMIGAN OP) Apply to eye.   Blood Glucose Calibration (ACCU-CHEK GUIDE CONTROL) LIQD 1 Act by In Vitro route 2 (two) times daily.   Blood Glucose Monitoring Suppl (ACCU-CHEK GUIDE ME) w/Device KIT 1 Act  by Does not apply route 2 (two) times daily.   Elastic Bandages & Supports (JOBST KNEE HIGH COMPRESSION SM) MISC Dispense black compression stocking, size 9.5 20-31mmHg   eszopiclone (LUNESTA) 2 MG TABS tablet Take 1 tablet (2 mg total) by mouth at bedtime as needed for sleep. Take immediately before bedtime   fluocinonide cream (LIDEX) 0.05 % Apply 1 application. topically 2 (two) times daily.   indapamide (LOZOL) 1.25 MG tablet  TAKE 1 TABLET BY MOUTH DAILY.   Iron-FA-B Cmp-C-Biot-Probiotic (FUSION PLUS) CAPS TAKE 1 CAPSULE BY MOUTH EVERY DAY   lidocaine (LIDODERM) 5 % Place 1 patch onto the skin daily. Remove & Discard patch within 12 hours or as directed by MD   losartan (COZAAR) 100 MG tablet Take 1 tablet (100 mg total) by mouth daily. (Patient taking differently: Take 50 mg by mouth daily. Take 1/2 Tablet Daily)   metoprolol tartrate (LOPRESSOR) 25 MG tablet Take 0.5 tablets (12.5 mg total) by mouth every morning.   nabumetone (RELAFEN) 500 MG tablet Take 1 tablet (500 mg total) by mouth 2 (two) times daily as needed.   pantoprazole (PROTONIX) 40 MG tablet Take 1 tablet (40 mg total) by mouth daily.   potassium chloride SA (KLOR-CON M) 20 MEQ tablet Take 1 tablet (20 mEq total) by mouth 2 (two) times daily. (Patient taking differently: Take 20 mEq by mouth daily.)   rosuvastatin (CRESTOR) 20 MG tablet Take 1 tablet (20 mg total) by mouth daily.   Turmeric 500 MG CAPS Take 1 capsule by mouth daily.   XIIDRA 5 % SOLN Apply 1 drop to eye 2 (two) times daily.   Studies Reviewed:       ZIO 01/22/2023 Normal sinus rhythm Frequent SVT The longest run was 2 min and 55 seconds with an average rate of 174  Coronary CTA 08/07/2021 1. Coronary calcium score of 15.6. This was 39th percentile for age-, sex, and race-matched controls.   2.  Normal coronary origin with right dominance.   3.  Mild atherosclerosis.  CAD RADS 2.   4.  Recommend preventive therapy and risk factor modification.   5.  Consider non atherosclerotic causes of chest pain.  Lexiscan stress test 07/18/2021   The study is low risk.   No ST deviation was noted.   LV perfusion is abnormal. There is a small defect with mild reduction in uptake present in the apical to mid inferior location(s) that is partially reversible. There is normal wall motion in the defect area. Findings either suggestive of small inferior infarct with peri-infarct ischemia  given that rest looks better than stress vs possible artifact from diaphragmatic attenuation.   Left ventricular function is normal. Nuclear stress EF: 64 %. The left ventricular ejection fraction is normal (55-65%). End diastolic cavity size is normal.   Prior study not available for comparison.  Risk Assessment/Calculations:             Physical Exam:   VS:  BP (!) 120/48 (BP Location: Left Arm, Patient Position: Sitting)   Pulse 69   Ht 5\' 6"  (1.676 m)   Wt 181 lb 12.8 oz (82.5 kg)   SpO2 97%   BMI 29.34 kg/m    Wt Readings from Last 3 Encounters:  07/09/23 181 lb 12.8 oz (82.5 kg)  04/16/23 186 lb 6.4 oz (84.6 kg)  01/22/23 182 lb (82.6 kg)    GEN: Well nourished, well developed in no acute distress NECK: No JVD; No carotid bruits CARDIAC: RRR, no  murmurs, rubs, gallops RESPIRATORY:  Clear to auscultation without rales, wheezing or rhonchi  ABDOMEN: Soft, non-tender, non-distended EXTREMITIES:  No edema; No acute deformity     Assessment and Plan:  Paroxysmal supraventricular tachycardia Zio patch 01/22/2023 showed frequent SVT, the longest run was 2 minutes and 55 seconds with an average rate of 174 - She has recently decreased her caffeine intake as well as decrease other identifiable triggers to her SVT.  Overall since the addition of metoprolol and lifestyle changes patients duration and frequency of SVT have dramatically decreased.  She notes only 1 episode since January 2025. - Continue metoprolol tartrate 12.5 mg once daily  Hypertension Blood pressure today is under good control at 120/48 - Continue losartan 50 mg daily and metoprolol tartrate 12.5 mg daily - Continue to monitor BP daily at home  Type 2 diabetes A1c 6.4 on 12/2022 and being controlled on diet alone - Managed by PCP  Coronary artery disease / Hyperlipidemia Coronary CTA 07/2021 with coronary calcium score 15.6 (39th Percentile) LDL 75, HDL 67, TG 77 on 12/2022 - Continue to work on diet and  exercise to lower LDL less than 70  - Continue rosuvastatin 20 mg daily      Dispo:  No follow-ups on file.  Signed, Denyce Robert, NP

## 2023-07-09 NOTE — Patient Instructions (Signed)
 Medication Instructions:  NO CHANGES    Lab Work: NONE   Testing/Procedures: NONE  Follow-Up: At [facility], you and your health needs are our priority.  As part of our continuing mission to provide you with exceptional heart care, our providers are all part of one team.  This team includes your primary Cardiologist (physician) and Advanced Practice Providers or APPs (Physician Assistants and Nurse Practitioners) who all work together to provide you with the care you need, when you need it.  Your next appointment:   6 month(s)  Provider:   Rollene Rotunda, MD  or Rise Paganini, DNP     Other Instructions:      1st Floor: - Lobby - Registration  - Pharmacy  - Lab - Cafe  2nd Floor: - PV Lab - Diagnostic Testing (echo, CT, nuclear med)  3rd Floor: - Vacant  4th Floor: - TCTS (cardiothoracic surgery) - AFib Clinic - Structural Heart Clinic - Vascular Surgery  - Vascular Ultrasound  5th Floor: - [facility] (general and EP) - Clinical Pharmacy for coumadin, hypertension, lipid, weight-loss medications, and med management appointments    Valet parking services will be available as well.

## 2023-07-14 ENCOUNTER — Ambulatory Visit (INDEPENDENT_AMBULATORY_CARE_PROVIDER_SITE_OTHER): Admitting: Emergency Medicine

## 2023-07-14 ENCOUNTER — Encounter: Payer: Self-pay | Admitting: Emergency Medicine

## 2023-07-14 VITALS — BP 130/64 | HR 57 | Temp 98.0°F | Resp 16 | Ht 66.0 in | Wt 178.6 lb

## 2023-07-14 DIAGNOSIS — E118 Type 2 diabetes mellitus with unspecified complications: Secondary | ICD-10-CM

## 2023-07-14 DIAGNOSIS — I10 Essential (primary) hypertension: Secondary | ICD-10-CM

## 2023-07-14 DIAGNOSIS — K2100 Gastro-esophageal reflux disease with esophagitis, without bleeding: Secondary | ICD-10-CM | POA: Diagnosis not present

## 2023-07-14 DIAGNOSIS — G4701 Insomnia due to medical condition: Secondary | ICD-10-CM

## 2023-07-14 DIAGNOSIS — G8929 Other chronic pain: Secondary | ICD-10-CM

## 2023-07-14 MED ORDER — ESZOPICLONE 2 MG PO TABS: 2.0000 mg | ORAL_TABLET | Freq: Every evening | ORAL | 1 refills | Status: DC | PRN

## 2023-07-14 NOTE — Assessment & Plan Note (Signed)
stable overall by history and exam, recent data reviewed with pt, and pt to continue medical treatment as before,  to f/u any worsening symptoms or concerns  

## 2023-07-14 NOTE — Assessment & Plan Note (Signed)
 Responding well to Lunesta 2 mg at bedtime Needs medication refill today Sleep hygiene measures discussed

## 2023-07-14 NOTE — Assessment & Plan Note (Signed)
 BP Readings from Last 3 Encounters:  07/14/23 130/64  07/09/23 (!) 120/48  04/16/23 116/70  Well-controlled hypertension Continue metoprolol or tartrate 12.5 mg every morning, losartan 50 mg daily

## 2023-07-14 NOTE — Progress Notes (Signed)
 Alexandria [name] 83 y.o.   Chief Complaint  Patient presents with   Medication Refill    HISTORY OF PRESENT ILLNESS: This is a 83 y.o. female patient of Dr. Sanda Linger here for medication refill History of chronic insomnia.  Lunesta 2 mg tablets working well for her. No other complaint or medical concerns today.  Medication Refill Pertinent negatives include no abdominal pain, chest pain, chills, congestion, coughing, fever, headaches, nausea, rash, sore throat or vomiting.     Prior to Admission medications   Medication Sig Start Date End Date Taking? Authorizing Provider  ACCU-CHEK GUIDE TEST test strip USE 1 STRIP TO TEST TWICE A DAY - (KEEP UNUSED STRIPS IN ORIGINAL SEALED CONTAINER BETWEEN USES) 06/04/23  Yes Etta Grandchild, MD  Bimatoprost (LUMIGAN OP) Apply to eye.   Yes [provider]  Blood Glucose Calibration (ACCU-CHEK GUIDE CONTROL) LIQD 1 Act by In Vitro route 2 (two) times daily. 09/21/21  Yes Etta Grandchild, MD  Blood Glucose Monitoring Suppl (ACCU-CHEK GUIDE ME) w/Device KIT 1 Act by Does not apply route 2 (two) times daily. 09/21/21  Yes Etta Grandchild, MD  cyclobenzaprine (FLEXERIL) 5 MG tablet Take 1 tablet (5 mg total) by mouth 3 (three) times daily as needed for muscle spasms. 01/11/23  Yes Etta Grandchild, MD  Elastic Bandages & Supports (JOBST KNEE HIGH COMPRESSION SM) MISC Dispense black compression stocking, size 9.5 20-65mmHg 02/08/23  Yes McDonald, Adam R, DPM  eszopiclone (LUNESTA) 2 MG TABS tablet Take 1 tablet (2 mg total) by mouth at bedtime as needed for sleep. Take immediately before bedtime 01/11/23  Yes Etta Grandchild, MD  fluocinonide cream (LIDEX) 0.05 % Apply 1 application. topically 2 (two) times daily. 06/16/21  Yes Etta Grandchild, MD  indapamide (LOZOL) 1.25 MG tablet TAKE 1 TABLET BY MOUTH DAILY. 02/17/23  Yes Etta Grandchild, MD  Iron-FA-B Cmp-C-Biot-Probiotic (FUSION PLUS) CAPS TAKE 1 CAPSULE BY MOUTH EVERY DAY 04/22/23  Yes Etta Grandchild, MD  lidocaine (LIDODERM) 5 % Place 1 patch onto the skin daily. Remove & Discard patch within 12 hours or as directed by MD 01/08/23  Yes Etta Grandchild, MD  losartan (COZAAR) 100 MG tablet Take 1 tablet (100 mg total) by mouth daily. Patient taking differently: Take 50 mg by mouth daily. Take 1/2 Tablet Daily 01/11/23  Yes Etta Grandchild, MD  metoprolol tartrate (LOPRESSOR) 25 MG tablet Take 0.5 tablets (12.5 mg total) by mouth every morning. 04/16/23 07/15/23 Yes Jodelle Gross, NP  nabumetone (RELAFEN) 500 MG tablet Take 1 tablet (500 mg total) by mouth 2 (two) times daily as needed. 05/11/22  Yes Etta Grandchild, MD  pantoprazole (PROTONIX) 40 MG tablet Take 1 tablet (40 mg total) by mouth daily. 02/23/22  Yes Meryl Dare, MD  potassium chloride SA (KLOR-CON M) 20 MEQ tablet Take 1 tablet (20 mEq total) by mouth 2 (two) times daily. Patient taking differently: Take 20 mEq by mouth daily. 01/11/23  Yes Etta Grandchild, MD  rosuvastatin (CRESTOR) 20 MG tablet Take 1 tablet (20 mg total) by mouth daily. 01/08/23 01/03/24 Yes Etta Grandchild, MD  Turmeric 500 MG CAPS Take 1 capsule by mouth daily. 07/27/19  Yes Etta Grandchild, MD  XIIDRA 5 % SOLN Apply 1 drop to eye 2 (two) times daily. 02/12/22  Yes [provider]    Allergies  Allergen Reactions   Oxycodone-Acetaminophen     REACTION: nausea and vomiting  Tramadol Hcl     REACTION: nausea and vomiting    Patient Active Problem List   Diagnosis Date Noted   Hypertrophic toenail 01/08/2023   Left bundle branch block (LBBB) on electrocardiogram 01/08/2023   Neuroforaminal stenosis of cervical spine 05/13/2022   DDD (degenerative disc disease), cervical 05/13/2022   Neck pain, chronic 05/11/2022   Esophageal dysphagia 09/15/2021   Abnormal finding on thallium stress test 07/21/2021   Bunion, left foot 06/12/2021   Intrinsic eczema 06/12/2021   Grade 1 out of 6 intensity murmur 06/12/2021   Abnormal  electrocardiogram (ECG) (EKG) 06/12/2021   Need for prophylactic vaccination with combined diphtheria-tetanus-pertussis (DTP) vaccine 11/14/2020   Encounter for general adult medical examination with abnormal findings 11/14/2020   Chronic bilateral low back pain without sciatica 04/09/2020   Insomnia secondary to chronic pain 04/09/2020   Stage 3a chronic kidney disease (HCC) 10/20/2019   Primary osteoarthritis involving multiple joints 01/09/2016   Gastroesophageal reflux disease with esophagitis 12/21/2013   Vitamin D deficiency 10/30/2012   Obesity (BMI 30.0-34.9) 10/28/2012   Degenerative disc disease, lumbar 11/24/2011   Type II diabetes mellitus with manifestations (HCC) 10/06/2011   Glaucoma 07/23/2008   FIBROCYSTIC BREAST DISEASE 07/23/2008   Hyperlipidemia with target LDL less than 100 04/19/2008   Iron deficiency anemia 04/19/2008   Essential hypertension 04/19/2008   OA (osteoarthritis of spine) 04/19/2008   OSTEOPENIA 04/19/2008    Past Medical History:  Diagnosis Date   Anemia, iron deficiency    Cataracts, bilateral    Colon polyp 09/2006   adenomatous   Diabetes (HCC)    Fibrocystic breast disease    GERD (gastroesophageal reflux disease)    Glaucoma, right eye    Hyperlipidemia    Hypertension    LBP (low back pain)    Osteoarthritis    Osteopenia     Past Surgical History:  Procedure Laterality Date   ABDOMINAL HYSTERECTOMY     BREAST EXCISIONAL BIOPSY Right    BREAST LUMPECTOMY     RIGHT 22 years ago   CHOLECYSTECTOMY      Social History   Socioeconomic History   Marital status: Widowed    Spouse name: Not on file   Number of children: 2   Years of education: Not on file   Highest education level: Not on file  Occupational History   Occupation: RN L&D [employer]  Tobacco Use   Smoking status: Never   Smokeless tobacco: Never  Vaping Use   Vaping status: Never Used  Substance and Sexual Activity   Alcohol use: No   Drug use: No   Sexual  activity: Not Currently    Birth control/protection: Surgical  Other Topics Concern   Not on file  Social History Narrative   [employer] work.  Lives at home.  Two children.     Social Drivers of Corporate investment banker Strain: Low Risk  (01/07/2023)   Overall Financial Resource Strain (CARDIA)    Difficulty of Paying Living Expenses: Not hard at all  Food Insecurity: No Food Insecurity (01/07/2023)   Hunger Vital Sign    Worried About Running Out of Food in the Last Year: Never true    Ran Out of Food in the Last Year: Never true  Transportation Needs: No Transportation Needs (01/07/2023)   PRAPARE - Administrator, Civil Service (Medical): No    Lack of Transportation (Non-Medical): No  Physical Activity: Sufficiently Active (01/07/2023)   Exercise Vital Sign  Days of Exercise per Week: 5 days    Minutes of Exercise per Session: 30 min  Stress: No Stress Concern Present (01/07/2023)   [employer] of Occupational Health - Occupational Stress Questionnaire    Feeling of Stress : Not at all  Social Connections: Moderately Integrated (01/07/2023)   Social Connection and Isolation Panel [NHANES]    Frequency of Communication with Friends and Family: More than three times a week    Frequency of Social Gatherings with Friends and Family: Once a week    Attends Religious Services: More than 4 times per year    Active Member of [employer] or Organizations: Yes    Attends Banker Meetings: More than 4 times per year    Marital Status: Widowed  Intimate Partner Violence: Not At Risk (01/07/2023)   Humiliation, Afraid, Rape, and Kick questionnaire    Fear of Current or Ex-Partner: No    Emotionally Abused: No    Physically Abused: No    Sexually Abused: No    Family History  Problem Relation Age of Onset   Arthritis Other    Hypertension Other    Colon cancer Neg Hx    Esophageal cancer Neg Hx    Rectal cancer Neg Hx    Stomach cancer Neg Hx    Breast  cancer Neg Hx      Review of Systems  Constitutional: Negative.  Negative for chills and fever.  HENT: Negative.  Negative for congestion and sore throat.   Respiratory: Negative.  Negative for cough and shortness of breath.   Cardiovascular: Negative.  Negative for chest pain and palpitations.  Gastrointestinal:  Negative for abdominal pain, diarrhea, nausea and vomiting.  Genitourinary: Negative.  Negative for dysuria and hematuria.  Musculoskeletal:  Positive for joint pain.  Skin: Negative.  Negative for rash.  Neurological:  Negative for dizziness and headaches.  Psychiatric/Behavioral:  The patient has insomnia.   All other systems reviewed and are negative.   Vitals:   07/14/23 0842  BP: 130/64  Pulse: (!) 57  Resp: 16  Temp: 98 F (36.7 C)  SpO2: 98%    Physical Exam Vitals reviewed.  Constitutional:      Appearance: Normal appearance.  HENT:     Head: Normocephalic.  Eyes:     Extraocular Movements: Extraocular movements intact.  Cardiovascular:     Rate and Rhythm: Normal rate.  Pulmonary:     Effort: Pulmonary effort is normal.  Skin:    General: Skin is warm and dry.  Neurological:     Mental Status: She is alert and oriented to person, place, and time.  Psychiatric:        Mood and Affect: Mood normal.        Behavior: Behavior normal.      ASSESSMENT & PLAN: A total of 32 minutes was spent with the patient and counseling/coordination of care regarding preparing for this visit, review of most recent office visit notes, review of multiple chronic medical conditions and their management, review of all medications, review of most recent bloodwork results, review of health maintenance items, education on nutrition, prognosis, documentation, and need for follow up.   Problem List Items Addressed This Visit       Cardiovascular and Mediastinum   Essential hypertension   BP Readings from Last 3 Encounters:  07/14/23 130/64  07/09/23 (!) 120/48   04/16/23 116/70  Well-controlled hypertension Continue metoprolol or tartrate 12.5 mg every morning, losartan 50 mg daily  Digestive   Gastroesophageal reflux disease with esophagitis   Responding well to pantoprazole 40 mg daily        Endocrine   Type II diabetes mellitus with manifestations (HCC)   stable overall by history and exam, recent data reviewed with pt, and pt to continue medical treatment as before, to f/u any worsening symptoms or concerns         Other   Insomnia secondary to chronic pain - Primary   Responding well to Lunesta 2 mg at bedtime Needs medication refill today Sleep hygiene measures discussed      Relevant Medications   eszopiclone (LUNESTA) 2 MG TABS tablet   Patient Instructions  Health Maintenance After Age 66 After age 14, you are at a higher risk for certain long-term diseases and infections as well as injuries from falls. Falls are a major cause of broken bones and head injuries in people who are older than age 80. Getting regular preventive care can help to keep you healthy and well. Preventive care includes getting regular testing and making lifestyle changes as recommended by your health care provider. Talk with your health care provider about: Which screenings and tests you should have. A screening is a test that checks for a disease when you have no symptoms. A diet and exercise plan that is right for you. What should I know about screenings and tests to prevent falls? Screening and testing are the best ways to find a health problem early. Early diagnosis and treatment give you the best chance of managing medical conditions that are common after age 19. Certain conditions and lifestyle choices may make you more likely to have a fall. Your health care provider may recommend: Regular vision checks. Poor vision and conditions such as cataracts can make you more likely to have a fall. If you wear glasses, make sure to get your  prescription updated if your vision changes. Medicine review. Work with your health care provider to regularly review all of the medicines you are taking, including over-the-counter medicines. Ask your health care provider about any side effects that may make you more likely to have a fall. Tell your health care provider if any medicines that you take make you feel dizzy or sleepy. Strength and balance checks. Your health care provider may recommend certain tests to check your strength and balance while standing, walking, or changing positions. Foot health exam. Foot pain and numbness, as well as not wearing proper footwear, can make you more likely to have a fall. Screenings, including: Osteoporosis screening. Osteoporosis is a condition that causes the bones to get weaker and break more easily. Blood pressure screening. Blood pressure changes and medicines to control blood pressure can make you feel dizzy. Depression screening. You may be more likely to have a fall if you have a fear of falling, feel depressed, or feel unable to do activities that you used to do. Alcohol use screening. Using too much alcohol can affect your balance and may make you more likely to have a fall. Follow these instructions at home: Lifestyle Do not drink alcohol if: Your health care provider tells you not to drink. If you drink alcohol: Limit how much you have to: 0-1 drink a day for women. 0-2 drinks a day for men. Know how much alcohol is in your drink. In the [place] one drink equals one 12 oz bottle of beer (355 mL), one 5 oz glass of wine (148 mL), or one 1 oz glass of hard  liquor (44 mL). Do not use any products that contain nicotine or tobacco. These products include cigarettes, chewing tobacco, and vaping devices, such as e-cigarettes. If you need help quitting, ask your health care provider. Activity  Follow a regular exercise program to stay fit. This will help you maintain your balance. Ask your health  care provider what types of exercise are appropriate for you. If you need a cane or walker, use it as recommended by your health care provider. Wear supportive shoes that have nonskid soles. Safety  Remove any tripping hazards, such as rugs, cords, and clutter. Install safety equipment such as grab bars in bathrooms and safety rails on stairs. Keep rooms and walkways well-lit. General instructions Talk with your health care provider about your risks for falling. Tell your health care provider if: You fall. Be sure to tell your health care provider about all falls, even ones that seem minor. You feel dizzy, tiredness (fatigue), or off-balance. Take over-the-counter and prescription medicines only as told by your health care provider. These include supplements. Eat a healthy diet and maintain a healthy weight. A healthy diet includes low-fat dairy products, low-fat (lean) meats, and fiber from whole grains, beans, and lots of fruits and vegetables. Stay current with your vaccines. Schedule regular health, dental, and eye exams. Summary Having a healthy lifestyle and getting preventive care can help to protect your health and wellness after age 61. Screening and testing are the best way to find a health problem early and help you avoid having a fall. Early diagnosis and treatment give you the best chance for managing medical conditions that are more common for people who are older than age 17. Falls are a major cause of broken bones and head injuries in people who are older than age 36. Take precautions to prevent a fall at home. Work with your health care provider to learn what changes you can make to improve your health and wellness and to prevent falls. This information is not intended to replace advice given to you by your health care provider. Make sure you discuss any questions you have with your health care provider. Document Revised: 08/05/2020 Document Reviewed: 08/05/2020 Elsevier  Patient Education  2024 Elsevier Inc.   Maryagnes Small, MD [facility]

## 2023-07-14 NOTE — Assessment & Plan Note (Signed)
 Responding well to pantoprazole 40 mg daily

## 2023-07-14 NOTE — Patient Instructions (Signed)
 Health Maintenance After Age 81 After age 4, you are at a higher risk for certain long-term diseases and infections as well as injuries from falls. Falls are a major cause of broken bones and head injuries in people who are older than age 47. Getting regular preventive care can help to keep you healthy and well. Preventive care includes getting regular testing and making lifestyle changes as recommended by your health care provider. Talk with your health care provider about: Which screenings and tests you should have. A screening is a test that checks for a disease when you have no symptoms. A diet and exercise plan that is right for you. What should I know about screenings and tests to prevent falls? Screening and testing are the best ways to find a health problem early. Early diagnosis and treatment give you the best chance of managing medical conditions that are common after age 37. Certain conditions and lifestyle choices may make you more likely to have a fall. Your health care provider may recommend: Regular vision checks. Poor vision and conditions such as cataracts can make you more likely to have a fall. If you wear glasses, make sure to get your prescription updated if your vision changes. Medicine review. Work with your health care provider to regularly review all of the medicines you are taking, including over-the-counter medicines. Ask your health care provider about any side effects that may make you more likely to have a fall. Tell your health care provider if any medicines that you take make you feel dizzy or sleepy. Strength and balance checks. Your health care provider may recommend certain tests to check your strength and balance while standing, walking, or changing positions. Foot health exam. Foot pain and numbness, as well as not wearing proper footwear, can make you more likely to have a fall. Screenings, including: Osteoporosis screening. Osteoporosis is a condition that causes  the bones to get weaker and break more easily. Blood pressure screening. Blood pressure changes and medicines to control blood pressure can make you feel dizzy. Depression screening. You may be more likely to have a fall if you have a fear of falling, feel depressed, or feel unable to do activities that you used to do. Alcohol use screening. Using too much alcohol can affect your balance and may make you more likely to have a fall. Follow these instructions at home: Lifestyle Do not drink alcohol if: Your health care provider tells you not to drink. If you drink alcohol: Limit how much you have to: 0-1 drink a day for women. 0-2 drinks a day for men. Know how much alcohol is in your drink. In the [place] one drink equals one 12 oz bottle of beer (355 mL), one 5 oz glass of wine (148 mL), or one 1 oz glass of hard liquor (44 mL). Do not use any products that contain nicotine or tobacco. These products include cigarettes, chewing tobacco, and vaping devices, such as e-cigarettes. If you need help quitting, ask your health care provider. Activity  Follow a regular exercise program to stay fit. This will help you maintain your balance. Ask your health care provider what types of exercise are appropriate for you. If you need a cane or walker, use it as recommended by your health care provider. Wear supportive shoes that have nonskid soles. Safety  Remove any tripping hazards, such as rugs, cords, and clutter. Install safety equipment such as grab bars in bathrooms and safety rails on stairs. Keep rooms and walkways  well-lit. General instructions Talk with your health care provider about your risks for falling. Tell your health care provider if: You fall. Be sure to tell your health care provider about all falls, even ones that seem minor. You feel dizzy, tiredness (fatigue), or off-balance. Take over-the-counter and prescription medicines only as told by your health care provider. These include  supplements. Eat a healthy diet and maintain a healthy weight. A healthy diet includes low-fat dairy products, low-fat (lean) meats, and fiber from whole grains, beans, and lots of fruits and vegetables. Stay current with your vaccines. Schedule regular health, dental, and eye exams. Summary Having a healthy lifestyle and getting preventive care can help to protect your health and wellness after age 11. Screening and testing are the best way to find a health problem early and help you avoid having a fall. Early diagnosis and treatment give you the best chance for managing medical conditions that are more common for people who are older than age 28. Falls are a major cause of broken bones and head injuries in people who are older than age 48. Take precautions to prevent a fall at home. Work with your health care provider to learn what changes you can make to improve your health and wellness and to prevent falls. This information is not intended to replace advice given to you by your health care provider. Make sure you discuss any questions you have with your health care provider. Document Revised: 08/05/2020 Document Reviewed: 08/05/2020 Elsevier Patient Education  2024 [employer].

## 2023-08-11 ENCOUNTER — Other Ambulatory Visit: Payer: Self-pay | Admitting: Internal Medicine

## 2023-08-11 DIAGNOSIS — D508 Other iron deficiency anemias: Secondary | ICD-10-CM

## 2023-08-13 ENCOUNTER — Telehealth: Payer: Self-pay

## 2023-08-13 NOTE — Telephone Encounter (Signed)
 [name] from Dr. Rochelle Chu office left a VM. She asks you to give her a call back about this pt Alexandria [name]. Did not specify why. Her direct number is [phone]. Thanks.

## 2023-08-18 ENCOUNTER — Ambulatory Visit (INDEPENDENT_AMBULATORY_CARE_PROVIDER_SITE_OTHER)

## 2023-08-18 DIAGNOSIS — E118 Type 2 diabetes mellitus with unspecified complications: Secondary | ICD-10-CM

## 2023-08-18 DIAGNOSIS — M2042 Other hammer toe(s) (acquired), left foot: Secondary | ICD-10-CM | POA: Diagnosis not present

## 2023-08-18 DIAGNOSIS — M21612 Bunion of left foot: Secondary | ICD-10-CM

## 2023-08-18 DIAGNOSIS — M2012 Hallux valgus (acquired), left foot: Secondary | ICD-10-CM

## 2023-08-18 DIAGNOSIS — M2141 Flat foot [pes planus] (acquired), right foot: Secondary | ICD-10-CM

## 2023-08-18 DIAGNOSIS — M2041 Other hammer toe(s) (acquired), right foot: Secondary | ICD-10-CM

## 2023-08-18 DIAGNOSIS — L84 Corns and callosities: Secondary | ICD-10-CM

## 2023-08-18 NOTE — Progress Notes (Signed)
 Patient was here to pu shoes did not want to wait until Dr re-dated ppw, dispensed today she is aware items may reject and go to [facility]  Patient was a no show in March and since ppw has expired I tried to call [name] back from Dr office  Patient said its been a year I corrected her and stated I saw her in mid-November 2024 and she missed delivery appt Mar. 28., not one year. Shoe were also delivered to an [place]  address in January in which took few weeks to sort out with [employer]   Patient was dispensed 1 pair of diabetic shoes and 3 pairs of total contact diabetic insoles. Fit was satisfactory. Instructions for break-in and wear was reviewed and a copy was given to the patient.   Re-appointment for regularly scheduled diabetic foot care visits or if they should experience any trouble with the shoes or insoles.

## 2023-08-30 ENCOUNTER — Encounter: Payer: Self-pay | Admitting: Internal Medicine

## 2023-08-30 ENCOUNTER — Ambulatory Visit: Admitting: Internal Medicine

## 2023-08-30 ENCOUNTER — Other Ambulatory Visit: Payer: Self-pay | Admitting: Internal Medicine

## 2023-08-30 VITALS — BP 146/78 | HR 65 | Temp 97.9°F | Ht 66.0 in | Wt 179.4 lb

## 2023-08-30 DIAGNOSIS — I10 Essential (primary) hypertension: Secondary | ICD-10-CM | POA: Diagnosis not present

## 2023-08-30 DIAGNOSIS — E118 Type 2 diabetes mellitus with unspecified complications: Secondary | ICD-10-CM | POA: Diagnosis not present

## 2023-08-30 DIAGNOSIS — E785 Hyperlipidemia, unspecified: Secondary | ICD-10-CM

## 2023-08-30 DIAGNOSIS — R600 Localized edema: Secondary | ICD-10-CM

## 2023-08-30 DIAGNOSIS — D508 Other iron deficiency anemias: Secondary | ICD-10-CM

## 2023-08-30 DIAGNOSIS — I447 Left bundle-branch block, unspecified: Secondary | ICD-10-CM

## 2023-08-30 DIAGNOSIS — N1831 Chronic kidney disease, stage 3a: Secondary | ICD-10-CM | POA: Diagnosis not present

## 2023-08-30 DIAGNOSIS — Z794 Long term (current) use of insulin: Secondary | ICD-10-CM

## 2023-08-30 DIAGNOSIS — D539 Nutritional anemia, unspecified: Secondary | ICD-10-CM

## 2023-08-30 LAB — BASIC METABOLIC PANEL WITH GFR
BUN: 28 mg/dL — ABNORMAL HIGH (ref 6–23)
CO2: 29 meq/L (ref 19–32)
Calcium: 9.6 mg/dL (ref 8.4–10.5)
Chloride: 104 meq/L (ref 96–112)
Creatinine, Ser: 0.92 mg/dL (ref 0.40–1.20)
GFR: 58.49 mL/min — ABNORMAL LOW (ref >60.00)
Glucose, Bld: 96 mg/dL (ref 70–99)
Potassium: 4.5 meq/L (ref 3.5–5.1)
Sodium: 139 meq/L (ref 135–145)

## 2023-08-30 LAB — CBC WITH DIFFERENTIAL/PLATELET
Basophils Absolute: 0 10*3/uL (ref 0.0–0.1)
Basophils Relative: 0.7 % (ref 0.0–3.0)
Eosinophils Absolute: 0.3 10*3/uL (ref 0.0–0.7)
Eosinophils Relative: 5.4 % — ABNORMAL HIGH (ref 0.0–5.0)
HCT: 35.7 % — ABNORMAL LOW (ref 36.0–46.0)
Hemoglobin: 11.8 g/dL — ABNORMAL LOW (ref 12.0–15.0)
Lymphocytes Relative: 38.5 % (ref 12.0–46.0)
Lymphs Abs: 1.9 10*3/uL (ref 0.7–4.0)
MCHC: 32.9 g/dL (ref 30.0–36.0)
MCV: 88 fl (ref 78.0–100.0)
Monocytes Absolute: 0.5 10*3/uL (ref 0.1–1.0)
Monocytes Relative: 10 % (ref 3.0–12.0)
Neutro Abs: 2.2 10*3/uL (ref 1.4–7.7)
Neutrophils Relative %: 45.4 % (ref 43.0–77.0)
Platelets: 254 10*3/uL (ref 150.0–400.0)
RBC: 4.06 Mil/uL (ref 3.87–5.11)
RDW: 15.1 % (ref 11.5–15.5)
WBC: 5 10*3/uL (ref 4.0–10.5)

## 2023-08-30 LAB — IBC + FERRITIN
Ferritin: 508.8 ng/mL — ABNORMAL HIGH (ref 10.0–291.0)
Iron: 53 ug/dL (ref 42–145)
Saturation Ratios: 16.8 % — ABNORMAL LOW (ref 20.0–50.0)
TIBC: 316.4 ug/dL (ref 250.0–450.0)
Transferrin: 226 mg/dL (ref 212.0–360.0)

## 2023-08-30 LAB — HEMOGLOBIN A1C: Hgb A1c MFr Bld: 6.2 % (ref 4.6–6.5)

## 2023-08-30 MED ORDER — ACCU-CHEK GUIDE CONTROL VI LIQD: 1.0000 | Freq: Two times a day (BID) | 2 refills | Status: DC

## 2023-08-30 MED ORDER — ROSUVASTATIN CALCIUM 20 MG PO TABS: 20.0000 mg | ORAL_TABLET | Freq: Every day | ORAL | 1 refills | Status: DC

## 2023-08-30 MED ORDER — ACCU-CHEK GUIDE TEST VI STRP: ORAL_STRIP | 5 refills | Status: DC

## 2023-08-30 MED ORDER — LOSARTAN POTASSIUM 100 MG PO TABS: 100.0000 mg | ORAL_TABLET | Freq: Every day | ORAL | 1 refills | Status: DC

## 2023-08-30 MED ORDER — ACCU-CHEK GUIDE ME W/DEVICE KIT: 1.0000 | PACK | Freq: Two times a day (BID) | Status: DC

## 2023-08-30 NOTE — Progress Notes (Unsigned)
 Subjective:  Patient ID: Alexandria [name], female    DOB: [birth year]  Age: 83 y.o. MRN: [id number]  CC: No chief complaint on file.   HPI Alexandria [name] presents for ***  Outpatient Medications Prior to Visit  Medication Sig Dispense Refill   ACCU-CHEK GUIDE TEST test strip USE 1 STRIP TO TEST TWICE A DAY - (KEEP UNUSED STRIPS IN ORIGINAL SEALED CONTAINER BETWEEN USES) 100 each 5   Bimatoprost (LUMIGAN OP) Apply to eye.     Blood Glucose Calibration (ACCU-CHEK GUIDE CONTROL) LIQD 1 Act by In Vitro route 2 (two) times daily. 1 each 2   Blood Glucose Monitoring Suppl (ACCU-CHEK GUIDE ME) w/Device KIT 1 Act by Does not apply route 2 (two) times daily. 2 kit 0   cyclobenzaprine  (FLEXERIL ) 5 MG tablet Take 1 tablet (5 mg total) by mouth 3 (three) times daily as needed for muscle spasms. 270 tablet 1   Elastic Bandages & Supports (JOBST KNEE HIGH COMPRESSION SM) MISC Dispense black compression stocking, size 9.5 20-41mmHg 1 each 0   eszopiclone  (LUNESTA ) 2 MG TABS tablet Take 1 tablet (2 mg total) by mouth at bedtime as needed for sleep. Take immediately before bedtime 90 tablet 1   fluocinonide  cream (LIDEX ) 0.05 % Apply 1 application. topically 2 (two) times daily. 60 g 1   indapamide  (LOZOL ) 1.25 MG tablet TAKE 1 TABLET BY MOUTH DAILY. 90 tablet 1   Iron -FA-B Cmp-C-Biot-Probiotic (FUSION PLUS) CAPS TAKE 1 CAPSULE BY MOUTH EVERY DAY 30 capsule 2   lidocaine  (LIDODERM ) 5 % Place 1 patch onto the skin daily. Remove & Discard patch within 12 hours or as directed by MD 90 patch 1   losartan  (COZAAR ) 100 MG tablet Take 1 tablet (100 mg total) by mouth daily. (Patient taking differently: Take 50 mg by mouth daily. Take 1/2 Tablet Daily) 90 tablet 1   metoprolol  tartrate (LOPRESSOR ) 25 MG tablet Take 0.5 tablets (12.5 mg total) by mouth every morning. 90 tablet 3   nabumetone  (RELAFEN ) 500 MG tablet Take 1 tablet (500 mg total) by mouth 2 (two) times daily as needed. 180 tablet 0   pantoprazole   (PROTONIX ) 40 MG tablet Take 1 tablet (40 mg total) by mouth daily. 90 tablet 3   potassium chloride  SA (KLOR-CON  M) 20 MEQ tablet Take 1 tablet (20 mEq total) by mouth 2 (two) times daily. (Patient taking differently: Take 20 mEq by mouth daily.) 180 tablet 1   rosuvastatin  (CRESTOR ) 20 MG tablet Take 1 tablet (20 mg total) by mouth daily. 90 tablet 1   Turmeric 500 MG CAPS Take 1 capsule by mouth daily. 90 capsule 1   XIIDRA  5 % SOLN Apply 1 drop to eye 2 (two) times daily.     No facility-administered medications prior to visit.    ROS Review of Systems  Objective:  There were no vitals taken for this visit.  BP Readings from Last 3 Encounters:  08/30/23 (!) 146/78  07/14/23 130/64  07/09/23 (!) 120/48    Wt Readings from Last 3 Encounters:  08/30/23 179 lb 6.4 oz (81.4 kg)  07/14/23 178 lb 9.6 oz (81 kg)  07/09/23 181 lb 12.8 oz (82.5 kg)    Physical Exam  Lab Results  Component Value Date   WBC 3.6 (L) 01/08/2023   HGB 12.3 01/08/2023   HCT 38.4 01/08/2023   PLT 238.0 01/08/2023   GLUCOSE 89 01/08/2023   CHOL 158 01/08/2023   TRIG 77.0 01/08/2023   HDL 67.60  01/08/2023   LDLCALC 75 01/08/2023   ALT 14 05/11/2022   AST 17 05/11/2022   NA 139 01/08/2023   K 3.7 01/08/2023   CL 100 01/08/2023   CREATININE 0.82 01/08/2023   BUN 19 01/08/2023   CO2 30 01/08/2023   TSH 1.42 01/08/2023   HGBA1C 6.4 01/08/2023   MICROALBUR <0.7 01/08/2023    MM 3D SCREENING MAMMOGRAM BILATERAL BREAST Result Date: 02/09/2023 CLINICAL DATA:  Screening. EXAM: DIGITAL SCREENING BILATERAL MAMMOGRAM WITH TOMOSYNTHESIS AND CAD TECHNIQUE: Bilateral screening digital craniocaudal and mediolateral oblique mammograms were obtained. Bilateral screening digital breast tomosynthesis was performed. The images were evaluated with computer-aided detection. COMPARISON:  Previous exam(s). ACR Breast Density Category c: The breasts are heterogeneously dense, which may obscure small masses. FINDINGS:  There are no findings suspicious for malignancy. IMPRESSION: No mammographic evidence of malignancy. A result letter of this screening mammogram will be mailed directly to the patient. RECOMMENDATION: Screening mammogram in one year. (Code:SM-B-01Y) BI-RADS CATEGORY  1: Negative. Electronically Signed   By: Alinda Apley M.D.   On: 02/09/2023 08:31    Assessment & Plan:  Bilateral leg edema -     For home use only DME Other see comment  Left bundle branch block (LBBB) on electrocardiogram -     For home use only DME Other see comment     Follow-up: No follow-ups on file.  Sandra Crouch, MD

## 2023-08-30 NOTE — Patient Instructions (Signed)

## 2023-08-30 NOTE — Progress Notes (Unsigned)
 Subjective:  Patient ID: Alexandria [name], female    DOB: 03/28/1941  Age: 83 y.o. MRN: [id number]  CC: Medical Management of Chronic Issues (Overdue 6 month follow up. Patient has her concerns written down to discuss with you. ), Medication Refill (Please refill all of patients medications to [facility] (Meds by mail) ), Hypertension, Diabetes, and Hyperlipidemia   HPI Alexandria [name] presents for f/up ---    Discussed the use of AI scribe software for clinical note transcription with the patient, who gave verbal consent to proceed.  History of Present Illness   Alexandria [name] is an 83 year old female with diabetes who presents with swelling of both ankles and numbness of both feet.  She experiences swelling in both ankles, which is improved today due to wearing compression stockings and resting. The left ankle is more affected due to a previous injury, and she describes the swelling as 'blooming' at times.  She has numbness and stinging in both feet, extending from the ball of the foot to the toes, which she attributes to her diabetes. There is no numbness, burning, or tingling in her upper extremities.  She experiences right hip pain, particularly at night when lying on it, and lower back pain, also worse at night. The back pain does not radiate to her legs or feet.  She mentions slight chest discomfort occurring twice for about ten seconds each time, with the last episode occurring a couple of months ago. She has seen a cardiologist and reports no further episodes since then.  She is not on insulin but monitors her blood glucose levels using a CBG machine, although she finds the pen part difficult to navigate. Her pulse and blood pressure are generally well controlled.       Outpatient Medications Prior to Visit  Medication Sig Dispense Refill   Bimatoprost (LUMIGAN OP) Apply to eye.     cyclobenzaprine  (FLEXERIL ) 5 MG tablet Take 1 tablet (5 mg total) by mouth 3 (three) times daily as  needed for muscle spasms. 270 tablet 1   Elastic Bandages & Supports (JOBST KNEE HIGH COMPRESSION SM) MISC Dispense black compression stocking, size 9.5 20-21mmHg 1 each 0   eszopiclone  (LUNESTA ) 2 MG TABS tablet Take 1 tablet (2 mg total) by mouth at bedtime as needed for sleep. Take immediately before bedtime 90 tablet 1   fluocinonide  cream (LIDEX ) 0.05 % Apply 1 application. topically 2 (two) times daily. 60 g 1   indapamide  (LOZOL ) 1.25 MG tablet TAKE 1 TABLET BY MOUTH DAILY. 90 tablet 1   Iron -FA-B Cmp-C-Biot-Probiotic (FUSION PLUS) CAPS TAKE 1 CAPSULE BY MOUTH EVERY DAY 30 capsule 2   lidocaine  (LIDODERM ) 5 % Place 1 patch onto the skin daily. Remove & Discard patch within 12 hours or as directed by MD 90 patch 1   pantoprazole  (PROTONIX ) 40 MG tablet Take 1 tablet (40 mg total) by mouth daily. 90 tablet 3   potassium chloride  SA (KLOR-CON  M) 20 MEQ tablet Take 1 tablet (20 mEq total) by mouth 2 (two) times daily. (Patient taking differently: Take 20 mEq by mouth daily.) 180 tablet 1   Turmeric 500 MG CAPS Take 1 capsule by mouth daily. 90 capsule 1   XIIDRA  5 % SOLN Apply 1 drop to eye 2 (two) times daily.     ACCU-CHEK GUIDE TEST test strip USE 1 STRIP TO TEST TWICE A DAY - (KEEP UNUSED STRIPS IN ORIGINAL SEALED CONTAINER BETWEEN USES) 100 each 5   Blood Glucose Calibration (  ACCU-CHEK GUIDE CONTROL) LIQD 1 Act by In Vitro route 2 (two) times daily. 1 each 2   Blood Glucose Monitoring Suppl (ACCU-CHEK GUIDE ME) w/Device KIT 1 Act by Does not apply route 2 (two) times daily. 2 kit 0   losartan  (COZAAR ) 100 MG tablet Take 1 tablet (100 mg total) by mouth daily. (Patient taking differently: Take 50 mg by mouth daily. Take 1/2 Tablet Daily) 90 tablet 1   nabumetone  (RELAFEN ) 500 MG tablet Take 1 tablet (500 mg total) by mouth 2 (two) times daily as needed. 180 tablet 0   rosuvastatin  (CRESTOR ) 20 MG tablet Take 1 tablet (20 mg total) by mouth daily. 90 tablet 1   metoprolol  tartrate (LOPRESSOR )  25 MG tablet Take 0.5 tablets (12.5 mg total) by mouth every morning. 90 tablet 3   No facility-administered medications prior to visit.    ROS Review of Systems  Constitutional: Negative.  Negative for appetite change, chills, diaphoresis, fatigue and fever.  HENT: Negative.    Eyes: Negative.  Negative for visual disturbance.  Respiratory: Negative.  Negative for chest tightness, shortness of breath and wheezing.   Cardiovascular:  Positive for leg swelling. Negative for chest pain and palpitations.  Gastrointestinal: Negative.  Negative for abdominal pain, constipation, diarrhea, nausea and vomiting.  Genitourinary: Negative.  Negative for difficulty urinating, dysuria and hematuria.  Musculoskeletal:  Positive for arthralgias. Negative for joint swelling and myalgias.  Skin: Negative.   Neurological:  Negative for dizziness and weakness.  Hematological:  Negative for adenopathy. Does not bruise/bleed easily.  Psychiatric/Behavioral: Negative.      Objective:  BP (!) 146/78 (BP Location: Left Arm, Patient Position: Sitting, Cuff Size: Normal)   Pulse 65   Temp 97.9 F (36.6 C) (Oral)   Ht 5\' 6"  (1.676 m)   Wt 179 lb 6.4 oz (81.4 kg)   SpO2 97%   BMI 28.96 kg/m   BP Readings from Last 3 Encounters:  08/30/23 (!) 146/78  07/14/23 130/64  07/09/23 (!) 120/48    Wt Readings from Last 3 Encounters:  08/30/23 179 lb 6.4 oz (81.4 kg)  07/14/23 178 lb 9.6 oz (81 kg)  07/09/23 181 lb 12.8 oz (82.5 kg)    Physical Exam Vitals reviewed.  Constitutional:      Appearance: Normal appearance.  HENT:     Nose: Nose normal.     Mouth/Throat:     Mouth: Mucous membranes are moist.  Eyes:     General: No scleral icterus.    Conjunctiva/sclera: Conjunctivae normal.  Cardiovascular:     Rate and Rhythm: Normal rate and regular rhythm.     Heart sounds: No murmur heard.    No friction rub. No gallop.  Pulmonary:     Effort: Pulmonary effort is normal.     Breath sounds: No  stridor. No wheezing, rhonchi or rales.  Abdominal:     Palpations: There is no mass.     Tenderness: There is no abdominal tenderness. There is no guarding.     Hernia: No hernia is present.  Musculoskeletal:        General: Normal range of motion.     Cervical back: Neck supple.     Right lower leg: Edema (trace pitting) present.     Left lower leg: Edema (trace pitting) present.  Lymphadenopathy:     Cervical: No cervical adenopathy.  Skin:    General: Skin is warm and dry.     Coloration: Skin is not pale.  Findings: No rash.  Neurological:     General: No focal deficit present.     Mental Status: She is alert. Mental status is at baseline.  Psychiatric:        Mood and Affect: Mood normal.        Behavior: Behavior normal.     Lab Results  Component Value Date   WBC 5.0 08/30/2023   HGB 11.8 (L) 08/30/2023   HCT 35.7 (L) 08/30/2023   PLT 254.0 08/30/2023   GLUCOSE 96 08/30/2023   CHOL 158 01/08/2023   TRIG 77.0 01/08/2023   HDL 67.60 01/08/2023   LDLCALC 75 01/08/2023   ALT 14 05/11/2022   AST 17 05/11/2022   NA 139 08/30/2023   K 4.5 08/30/2023   CL 104 08/30/2023   CREATININE 0.92 08/30/2023   BUN 28 (H) 08/30/2023   CO2 29 08/30/2023   TSH 1.42 01/08/2023   HGBA1C 6.2 08/30/2023   MICROALBUR <0.7 08/30/2023    MM 3D SCREENING MAMMOGRAM BILATERAL BREAST Result Date: 02/09/2023 CLINICAL DATA:  Screening. EXAM: DIGITAL SCREENING BILATERAL MAMMOGRAM WITH TOMOSYNTHESIS AND CAD TECHNIQUE: Bilateral screening digital craniocaudal and mediolateral oblique mammograms were obtained. Bilateral screening digital breast tomosynthesis was performed. The images were evaluated with computer-aided detection. COMPARISON:  Previous exam(s). ACR Breast Density Category c: The breasts are heterogeneously dense, which may obscure small masses. FINDINGS: There are no findings suspicious for malignancy. IMPRESSION: No mammographic evidence of malignancy. A result letter of this  screening mammogram will be mailed directly to the patient. RECOMMENDATION: Screening mammogram in one year. (Code:SM-B-01Y) BI-RADS CATEGORY  1: Negative. Electronically Signed   By: Alinda Apley M.D.   On: 02/09/2023 08:31    Assessment & Plan:  Type II diabetes mellitus with manifestations (HCC)- Her blood sugar is well controlled. -     Basic metabolic panel with GFR; Future -     Urinalysis, Routine w reflex microscopic; Future -     Microalbumin / creatinine urine ratio; Future -     Accu-Chek Guide Me; 1 Act by Does not apply route 2 (two) times daily. -     Accu-Chek Guide Control; 1 Act by In Vitro route 2 (two) times daily.  Dispense: 1 each; Refill: 2 -     Accu-Chek Guide Test; Use as instructed  Dispense: 100 each; Refill: 5 -     Losartan  Potassium; Take 1 tablet (100 mg total) by mouth daily.  Dispense: 90 tablet; Refill: 1  Essential hypertension- BP is well controlled. -     Basic metabolic panel with GFR; Future -     Urinalysis, Routine w reflex microscopic; Future -     Losartan  Potassium; Take 1 tablet (100 mg total) by mouth daily.  Dispense: 90 tablet; Refill: 1  Iron  deficiency anemia secondary to inadequate dietary iron  intake -     CBC with Differential/Platelet; Future -     IBC + Ferritin; Future -     Hemoglobin A1c; Future  Stage 3a chronic kidney disease (HCC) -     Basic metabolic panel with GFR; Future -     Urinalysis, Routine w reflex microscopic; Future -     Microalbumin / creatinine urine ratio; Future  Bilateral leg edema -     For home use only DME Other see comment  Hyperlipidemia with target LDL less than 100 -     Rosuvastatin  Calcium ; Take 1 tablet (20 mg total) by mouth daily.  Dispense: 90 tablet; Refill: 1  Deficiency  anemia- Will evaluate for vitamin deficiencies. -     Reticulocytes; Future -     Vitamin B1; Future -     Zinc; Future -     Folate; Future -     Vitamin B12; Future     Follow-up: Return in about 6 months  (around 02/29/2024).  Sandra Crouch, MD

## 2023-08-31 ENCOUNTER — Ambulatory Visit: Payer: Self-pay | Admitting: Internal Medicine

## 2023-08-31 LAB — URINALYSIS, ROUTINE W REFLEX MICROSCOPIC
Bilirubin Urine: NEGATIVE
Hgb urine dipstick: NEGATIVE
Ketones, ur: NEGATIVE
Leukocytes,Ua: NEGATIVE
Nitrite: NEGATIVE
Specific Gravity, Urine: 1.025 (ref 1.000–1.030)
Total Protein, Urine: NEGATIVE
Urine Glucose: NEGATIVE
Urobilinogen, UA: 0.2 (ref 0.0–1.0)
pH: 6 (ref 5.0–8.0)

## 2023-08-31 LAB — MICROALBUMIN / CREATININE URINE RATIO
Creatinine,U: 118.7 mg/dL
Microalb Creat Ratio: UNDETERMINED mg/g (ref 0.0–30.0)
Microalb, Ur: <0.7 mg/dL

## 2023-09-06 ENCOUNTER — Encounter: Payer: Self-pay | Admitting: Internal Medicine

## 2023-09-06 ENCOUNTER — Other Ambulatory Visit: Payer: Self-pay | Admitting: Internal Medicine

## 2023-09-06 DIAGNOSIS — I10 Essential (primary) hypertension: Secondary | ICD-10-CM

## 2023-09-06 DIAGNOSIS — D539 Nutritional anemia, unspecified: Secondary | ICD-10-CM | POA: Insufficient documentation

## 2023-09-07 ENCOUNTER — Other Ambulatory Visit (INDEPENDENT_AMBULATORY_CARE_PROVIDER_SITE_OTHER)

## 2023-09-07 DIAGNOSIS — D539 Nutritional anemia, unspecified: Secondary | ICD-10-CM

## 2023-09-09 LAB — FOLATE: Folate: >23.2 ng/mL (ref >5.9)

## 2023-09-09 LAB — VITAMIN B12: Vitamin B-12: 261 pg/mL (ref 211–911)

## 2023-09-11 ENCOUNTER — Other Ambulatory Visit: Payer: Self-pay | Admitting: Internal Medicine

## 2023-09-11 DIAGNOSIS — D538 Other specified nutritional anemias: Secondary | ICD-10-CM | POA: Insufficient documentation

## 2023-09-11 LAB — VITAMIN B1: Vitamin B1 (Thiamine): 14 nmol/L (ref 8–30)

## 2023-09-11 MED ORDER — ZINC GLUCONATE 50 MG PO TABS: 50.0000 mg | ORAL_TABLET | Freq: Every day | ORAL | 1 refills | Status: AC

## 2023-11-16 DIAGNOSIS — E119 Type 2 diabetes mellitus without complications: Secondary | ICD-10-CM | POA: Diagnosis not present

## 2023-11-16 DIAGNOSIS — H43813 Vitreous degeneration, bilateral: Secondary | ICD-10-CM | POA: Diagnosis not present

## 2023-11-16 DIAGNOSIS — H16223 Keratoconjunctivitis sicca, not specified as Sjogren's, bilateral: Secondary | ICD-10-CM | POA: Diagnosis not present

## 2023-11-16 DIAGNOSIS — H401131 Primary open-angle glaucoma, bilateral, mild stage: Secondary | ICD-10-CM | POA: Diagnosis not present

## 2023-12-02 ENCOUNTER — Encounter: Payer: Self-pay | Admitting: Internal Medicine

## 2023-12-03 NOTE — Telephone Encounter (Signed)
 Please help me with this Freestyle or Dexcom. Can you show me how to order these once you are back in office?

## 2023-12-04 ENCOUNTER — Other Ambulatory Visit: Payer: Self-pay | Admitting: Internal Medicine

## 2023-12-04 DIAGNOSIS — D508 Other iron deficiency anemias: Secondary | ICD-10-CM

## 2023-12-06 ENCOUNTER — Encounter: Payer: Self-pay | Admitting: Pharmacist

## 2023-12-06 MED ORDER — FREESTYLE LIBRE 3 READER DEVI: 0 refills | Status: AC

## 2023-12-06 MED ORDER — FREESTYLE LIBRE 3 PLUS SENSOR MISC: 5 refills | Status: AC

## 2023-12-06 NOTE — Progress Notes (Signed)
 Pt requested a CGM monitor to be sent to [facility] pharmacy. Sent Freestyle Libre 3 Plus receiver and sensors and notified patient by [employer] that insurance might not cover it since she is not on insulin.  Darrelyn Drum, PharmD, BCPS, CPP Clinical Pharmacist Practitioner [facility] [phone]

## 2023-12-21 ENCOUNTER — Other Ambulatory Visit: Payer: Self-pay | Admitting: Emergency Medicine

## 2023-12-21 DIAGNOSIS — G8929 Other chronic pain: Secondary | ICD-10-CM

## 2023-12-21 NOTE — Telephone Encounter (Signed)
 Please send to PCP Dr. Sandra Crouch

## 2024-01-10 ENCOUNTER — Ambulatory Visit: Payer: Medicare PPO

## 2024-01-10 VITALS — Ht 66.0 in | Wt 179.0 lb

## 2024-01-10 DIAGNOSIS — Z0000 Encounter for general adult medical examination without abnormal findings: Secondary | ICD-10-CM

## 2024-01-10 NOTE — Patient Instructions (Addendum)
 Alexandria [name],  Thank you for taking the time for your Medicare Wellness Visit. I appreciate your continued commitment to your health goals. Please review the care plan we discussed, and feel free to reach out if I can assist you further.  Medicare recommends these wellness visits once per year to help you and your care team stay ahead of potential health issues. These visits are designed to focus on prevention, allowing your provider to concentrate on managing your acute and chronic conditions during your regular appointments.  Please note that Annual Wellness Visits do not include a physical exam. Some assessments may be limited, especially if the visit was conducted virtually. If needed, we may recommend a separate in-person follow-up with your provider.  Ongoing Care Seeing your primary care provider every 3 to 6 months helps us  monitor your health and provide consistent, personalized care.   Referrals If a referral was made during today's visit and you haven't received any updates within two weeks, please contact the referred provider directly to check on the status.  Recommended Screenings:  Health Maintenance  Topic Date Due   Flu Shot  10/29/2023   COVID-19 Vaccine (4 - 2025-26 season) 11/29/2023   Complete foot exam   01/08/2024   Hemoglobin A1C  02/29/2024   Eye exam for diabetics  03/14/2024   Yearly kidney function blood test for diabetes  08/29/2024   Yearly kidney health urinalysis for diabetes  08/29/2024   Medicare Annual Wellness Visit  01/09/2025   DTaP/Tdap/Td vaccine (3 - Td or Tdap) 01/21/2033   Pneumococcal Vaccine for age over 69  Completed   DEXA scan (bone density measurement)  Completed   Zoster (Shingles) Vaccine  Completed   Meningitis B Vaccine  Aged Out   Hepatitis C Screening  Discontinued       01/10/2024    3:56 PM  Advanced Directives  Does Patient Have a Medical Advance Directive? No  Would patient like information on creating a medical advance  directive? Yes (MAU/Ambulatory/Procedural Areas - Information given)   Advance Care Planning is important because it: Ensures you receive medical care that aligns with your values, goals, and preferences. Provides guidance to your family and loved ones, reducing the emotional burden of decision-making during critical moments.  Vision: Annual vision screenings are recommended for early detection of glaucoma, cataracts, and diabetic retinopathy. These exams can also reveal signs of chronic conditions such as diabetes and high blood pressure.  Dental: Annual dental screenings help detect early signs of oral cancer, gum disease, and other conditions linked to overall health, including heart disease and diabetes.

## 2024-01-10 NOTE — Progress Notes (Signed)
 Subjective:   Alexandria [name] is a 83 y.o. who presents for a Medicare Wellness preventive visit.  As a reminder, Annual Wellness Visits don't include a physical exam, and some assessments may be limited, especially if this visit is performed virtually. We may recommend an in-person follow-up visit with your provider if needed.  Visit Complete: Virtual I connected with  Alexandria [name] on 01/10/24 by a audio enabled telemedicine application and verified that I am speaking with the correct person using two identifiers.  Patient Location: Home  Provider Location: Office/Clinic  I discussed the limitations of evaluation and management by telemedicine. The patient expressed understanding and agreed to proceed.  Vital Signs: Because this visit was a virtual/telehealth visit, some criteria may be missing or patient reported. Any vitals not documented were not able to be obtained and vitals that have been documented are patient reported.  VideoDeclined- This patient declined Librarian, academic. Therefore the visit was completed with audio only.  Persons Participating in Visit: Patient.  AWV Questionnaire: No: Patient Medicare AWV questionnaire was not completed prior to this visit.  Cardiac Risk Factors include: advanced age (>35men, >11 women);diabetes mellitus;dyslipidemia;hypertension     Objective:    Today's Vitals   01/10/24 1554  Weight: 179 lb (81.2 kg)  Height: 5' 6 (1.676 m)   Body mass index is 28.89 kg/m.     01/10/2024    3:56 PM 01/07/2023    1:37 PM 02/02/2022    9:20 AM 05/08/2020    8:50 AM 04/09/2020   11:56 AM 01/15/2017   11:39 AM 10/30/2016    7:38 AM  Advanced Directives  Does Patient Have a Medical Advance Directive? No Yes No No No Yes  No   Type of Advance Directive  Living will    Healthcare Power of Attorney   Copy of Healthcare Power of Attorney in Chart?      No - copy requested    Would patient like information on creating a  medical advance directive? Yes (MAU/Ambulatory/Procedural Areas - Information given)  Yes (MAU/Ambulatory/Procedural Areas - Information given) Yes (MAU/Ambulatory/Procedural Areas - Information given) No - Patient declined       Data saved with a previous flowsheet row definition    Current Medications (verified) Outpatient Encounter Medications as of 01/10/2024  Medication Sig   Bimatoprost (LUMIGAN OP) Apply to eye.   Blood Glucose Calibration (ACCU-CHEK GUIDE CONTROL) LIQD 1 Act by In Vitro route 2 (two) times daily.   Blood Glucose Monitoring Suppl (ACCU-CHEK GUIDE ME) w/Device KIT 1 Act by Does not apply route 2 (two) times daily.   Continuous Glucose Receiver (FREESTYLE LIBRE 3 READER) DEVI Use with Libre 3 Plus sensors   Continuous Glucose Sensor (FREESTYLE LIBRE 3 PLUS SENSOR) MISC Change sensor every 15 days.   cyclobenzaprine  (FLEXERIL ) 5 MG tablet Take 1 tablet (5 mg total) by mouth 3 (three) times daily as needed for muscle spasms.   Elastic Bandages & Supports (JOBST KNEE HIGH COMPRESSION SM) MISC Dispense black compression stocking, size 9.5 20-89mmHg   eszopiclone  (LUNESTA ) 2 MG TABS tablet TAKE ONE TABLET BY MOUTH EVERY DAY AT BEDTIME AS NEEDED FOR SLEEP (AVOID HIGH FAT/HEAVY MEAL)   fluocinonide  cream (LIDEX ) 0.05 % Apply 1 application. topically 2 (two) times daily.   glucose blood (ACCU-CHEK GUIDE TEST) test strip Use as instructed   indapamide  (LOZOL ) 1.25 MG tablet TAKE ONE TABLET BY MOUTH EVERY DAY   Iron -FA-B Cmp-C-Biot-Probiotic (FUSION PLUS) CAPS TAKE 1 CAPSULE  BY MOUTH EVERY DAY   lidocaine  (LIDODERM ) 5 % Place 1 patch onto the skin daily. Remove & Discard patch within 12 hours or as directed by MD   losartan  (COZAAR ) 100 MG tablet Take 1 tablet (100 mg total) by mouth daily.   metoprolol  tartrate (LOPRESSOR ) 25 MG tablet Take 0.5 tablets (12.5 mg total) by mouth every morning.   pantoprazole  (PROTONIX ) 40 MG tablet Take 1 tablet (40 mg total) by mouth daily.    potassium chloride  SA (KLOR-CON  M) 20 MEQ tablet Take 1 tablet (20 mEq total) by mouth 2 (two) times daily. (Patient taking differently: Take 20 mEq by mouth daily.)   rosuvastatin  (CRESTOR ) 20 MG tablet Take 1 tablet (20 mg total) by mouth daily.   Turmeric 500 MG CAPS Take 1 capsule by mouth daily.   XIIDRA  5 % SOLN Apply 1 drop to eye 2 (two) times daily.   zinc  gluconate 50 MG tablet Take 1 tablet (50 mg total) by mouth daily.   No facility-administered encounter medications on file as of 01/10/2024.    Allergies (verified) Oxycodone-acetaminophen  and Tramadol hcl   History: Past Medical History:  Diagnosis Date   Anemia, iron  deficiency    Cataracts, bilateral    Colon polyp 09/2006   adenomatous   Diabetes (HCC)    Fibrocystic breast disease    GERD (gastroesophageal reflux disease)    Glaucoma, right eye    Hyperlipidemia    Hypertension    LBP (low back pain)    Osteoarthritis    Osteopenia    Past Surgical History:  Procedure Laterality Date   ABDOMINAL HYSTERECTOMY     BREAST EXCISIONAL BIOPSY Right    BREAST LUMPECTOMY     RIGHT 22 years ago   CHOLECYSTECTOMY     Family History  Problem Relation Age of Onset   Arthritis Other    Hypertension Other    Colon cancer Neg Hx    Esophageal cancer Neg Hx    Rectal cancer Neg Hx    Stomach cancer Neg Hx    Breast cancer Neg Hx    Social History   Socioeconomic History   Marital status: Widowed    Spouse name: Not on file   Number of children: 2   Years of education: Not on file   Highest education level: Bachelor's degree (e.g., BA, AB, BS)  Occupational History   Occupation: Charity fundraiser L&D [employer]  Tobacco Use   Smoking status: Never   Smokeless tobacco: Never  Vaping Use   Vaping status: Never Used  Substance and Sexual Activity   Alcohol use: No   Drug use: No   Sexual activity: Not Currently    Birth control/protection: Surgical  Other Topics Concern   Not on file  Social History Narrative   Yard  work.  Lives at home.  Two children.     Social Drivers of Corporate investment banker Strain: Low Risk  (01/10/2024)   Overall Financial Resource Strain (CARDIA)    Difficulty of Paying Living Expenses: Not hard at all  Food Insecurity: No Food Insecurity (01/10/2024)   Hunger Vital Sign    Worried About Running Out of Food in the Last Year: Never true    Ran Out of Food in the Last Year: Never true  Transportation Needs: No Transportation Needs (01/10/2024)   PRAPARE - Administrator, Civil Service (Medical): No    Lack of Transportation (Non-Medical): No  Physical Activity: Inactive (01/10/2024)   Exercise  Vital Sign    Days of Exercise per Week: 0 days    Minutes of Exercise per Session: 0 min  Stress: No Stress Concern Present (01/10/2024)   [employer] of Occupational Health - Occupational Stress Questionnaire    Feeling of Stress: Not at all  Social Connections: Moderately Integrated (01/10/2024)   Social Connection and Isolation Panel    Frequency of Communication with Friends and Family: More than three times a week    Frequency of Social Gatherings with Friends and Family: Three times a week    Attends Religious Services: More than 4 times per year    Active Member of Clubs or Organizations: Yes    Attends Banker Meetings: More than 4 times per year    Marital Status: Widowed    Tobacco Counseling Counseling given: Not Answered    Clinical Intake:  Pre-visit preparation completed: Yes  Pain : No/denies pain     BMI - recorded: 28.89 Nutritional Risks: None Diabetes: Yes CBG done?: No Did pt. bring in CBG monitor from home?: No  Lab Results  Component Value Date   HGBA1C 6.2 08/30/2023   HGBA1C 6.4 01/08/2023   HGBA1C 6.1 05/11/2022     How often do you need to have someone help you when you read instructions, pamphlets, or other written materials from your doctor or pharmacy?: 1 - Never  Interpreter Needed?:  No  Information entered by :: Verdie Saba, CMA   Activities of Daily Living     01/10/2024    3:59 PM  In your present state of health, do you have any difficulty performing the following activities:  Hearing? 0  Vision? 0  Difficulty concentrating or making decisions? 0  Walking or climbing stairs? 0  Dressing or bathing? 0  Doing errands, shopping? 0  Preparing Food and eating ? N  Using the Toilet? N  In the past six months, have you accidently leaked urine? N  Do you have problems with loss of bowel control? N  Managing your Medications? N  Managing your Finances? N  Housekeeping or managing your Housekeeping? N    Patient Care Team: Joshua Debby CROME, MD as PCP - Diedre Lavona Agent, MD as PCP - Cardiology (Cardiology) Cyrus Carwin, MD as Consulting Physician (Ophthalmology)  I have updated your Care Teams any recent Medical Services you may have received from other providers in the past year.     Assessment:   This is a routine wellness examination for Alexandria [name].  Hearing/Vision screen Hearing Screening - Comments:: Denies hearing difficulties   Vision Screening - Comments:: Wears rx glasses - up to date with routine eye exams with Carwin Cyrus   Goals Addressed               This Visit's Progress     Patient Stated (pt-stated)        Patient stated she's watching her diet and sugar intake due to being diagnosed with being a Diabetic       Depression Screen     01/10/2024    4:00 PM 07/14/2023    8:45 AM 01/07/2023    1:40 PM 06/22/2022    9:54 AM 02/02/2022    9:19 AM 06/12/2021    2:44 PM 04/09/2020   11:54 AM  PHQ 2/9 Scores  PHQ - 2 Score 0 2 0 2 0 0 0  PHQ- 9 Score 1 7 3 10        Fall Risk  01/10/2024    3:59 PM 07/14/2023    8:44 AM 01/07/2023    1:39 PM 12/31/2022    9:28 AM 02/02/2022    9:20 AM  Fall Risk   Falls in the past year? 0 0 0 0 0  Number falls in past yr: 0 0 0  0  Injury with Fall? 0 0 0  0  Risk for fall due to :  No Fall Risks No Fall Risks No Fall Risks  No Fall Risks  Follow up Falls evaluation completed;Falls prevention discussed Falls evaluation completed Falls prevention discussed  Falls evaluation completed      Data saved with a previous flowsheet row definition    MEDICARE RISK AT HOME:  Medicare Risk at Home Any stairs in or around the home?: Yes If so, are there any without handrails?: No Home free of loose throw rugs in walkways, pet beds, electrical cords, etc?: Yes Adequate lighting in your home to reduce risk of falls?: Yes Life alert?: No Use of a cane, walker or w/c?: No Grab bars in the bathroom?: No Shower chair or bench in shower?: Yes Elevated toilet seat or a handicapped toilet?: Yes  TIMED UP AND GO:  Was the test performed?  No  Cognitive Function: 6CIT completed    01/07/2023    1:43 PM  MMSE - Mini Mental State Exam  Not completed: Unable to complete        01/10/2024    4:02 PM 01/07/2023    1:40 PM 02/02/2022    9:23 AM  6CIT Screen  What Year? 0 points 0 points 0 points  What month? 0 points 0 points 0 points  What time? 0 points 0 points 0 points  Count back from 20 0 points 0 points 0 points  Months in reverse 0 points 0 points 0 points  Repeat phrase 2 points 0 points 0 points  Total Score 2 points 0 points 0 points    Immunizations Immunization History  Administered Date(s) Administered   Fluad Quad(high Dose 65+) 03/06/2020, 03/09/2022   INFLUENZA, HIGH DOSE SEASONAL PF 01/09/2016, 01/12/2017, 02/19/2018, 02/13/2019, 03/09/2022, 11/29/2022   Influenza,inj,Quad PF,6+ Mos 12/21/2013, 01/09/2015   PFIZER(Purple Top)SARS-COV-2 Vaccination 04/18/2019, 05/09/2019, 03/07/2020   PNEUMOCOCCAL CONJUGATE-20 11/29/2022   Pneumococcal Conjugate-13 12/21/2013   Pneumococcal Polysaccharide-23 10/06/2011, 11/15/2018   Td 10/17/2009   Tdap 01/22/2023   Zoster Recombinant(Shingrix) 09/26/2021, 03/09/2022   Zoster, Live 10/17/2009    Screening  Tests Health Maintenance  Topic Date Due   Influenza Vaccine  10/29/2023   COVID-19 Vaccine (4 - 2025-26 season) 11/29/2023   FOOT EXAM  01/08/2024   HEMOGLOBIN A1C  02/29/2024   OPHTHALMOLOGY EXAM  03/14/2024   Diabetic kidney evaluation - eGFR measurement  08/29/2024   Diabetic kidney evaluation - Urine ACR  08/29/2024   Medicare Annual Wellness (AWV)  01/09/2025   DTaP/Tdap/Td (3 - Td or Tdap) 01/21/2033   Pneumococcal Vaccine: 50+ Years  Completed   DEXA SCAN  Completed   Zoster Vaccines- Shingrix  Completed   Meningococcal B Vaccine  Aged Out   Hepatitis C Screening  Discontinued    Health Maintenance Items Addressed: 01/10/2024   Additional Screening:  Vision Screening: Recommended annual ophthalmology exams for early detection of glaucoma and other disorders of the eye. Is the patient up to date with their annual eye exam?  Yes  Who is the provider or what is the name of the office in which the patient attends annual eye exams? [facility]  Dental Screening: Recommended annual dental exams for proper oral hygiene  Community Resource Referral / Chronic Care Management: CRR required this visit?  No   CCM required this visit?  No   Plan:    I have personally reviewed and noted the following in the patient's chart:   Medical and social history Use of alcohol, tobacco or illicit drugs  Current medications and supplements including opioid prescriptions. Patient is not currently taking opioid prescriptions. Functional ability and status Nutritional status Physical activity Advanced directives List of other physicians Hospitalizations, surgeries, and ER visits in previous 12 months Vitals Screenings to include cognitive, depression, and falls Referrals and appointments  In addition, I have reviewed and discussed with patient certain preventive protocols, quality metrics, and best practice recommendations. A written personalized care plan for preventive services  as well as general preventive health recommendations were provided to patient.   Verdie CHRISTELLA Saba, CMA   01/10/2024   After Visit Summary: (MyChart) Due to this being a telephonic visit, the after visit summary with patients personalized plan was offered to patient via MyChart   Notes: Scheduled a Diabetes f/u appt w/PCP for 12/2023

## 2024-01-13 ENCOUNTER — Ambulatory Visit: Payer: Self-pay | Admitting: Internal Medicine

## 2024-01-13 ENCOUNTER — Ambulatory Visit (INDEPENDENT_AMBULATORY_CARE_PROVIDER_SITE_OTHER): Admitting: Internal Medicine

## 2024-01-13 ENCOUNTER — Encounter: Payer: Self-pay | Admitting: Internal Medicine

## 2024-01-13 VITALS — BP 136/68 | HR 60 | Temp 98.5°F | Resp 16 | Ht 66.0 in | Wt 175.4 lb

## 2024-01-13 DIAGNOSIS — Z23 Encounter for immunization: Secondary | ICD-10-CM

## 2024-01-13 DIAGNOSIS — I10 Essential (primary) hypertension: Secondary | ICD-10-CM

## 2024-01-13 DIAGNOSIS — E1122 Type 2 diabetes mellitus with diabetic chronic kidney disease: Secondary | ICD-10-CM

## 2024-01-13 DIAGNOSIS — D508 Other iron deficiency anemias: Secondary | ICD-10-CM

## 2024-01-13 DIAGNOSIS — E118 Type 2 diabetes mellitus with unspecified complications: Secondary | ICD-10-CM

## 2024-01-13 DIAGNOSIS — E785 Hyperlipidemia, unspecified: Secondary | ICD-10-CM | POA: Diagnosis not present

## 2024-01-13 DIAGNOSIS — N1831 Chronic kidney disease, stage 3a: Secondary | ICD-10-CM

## 2024-01-13 LAB — CBC WITH DIFFERENTIAL/PLATELET
Basophils Absolute: 0 K/uL (ref 0.0–0.1)
Basophils Relative: 0.5 % (ref 0.0–3.0)
Eosinophils Absolute: 0.2 K/uL (ref 0.0–0.7)
Eosinophils Relative: 5.4 % — ABNORMAL HIGH (ref 0.0–5.0)
HCT: 33.9 % — ABNORMAL LOW (ref 36.0–46.0)
Hemoglobin: 11.1 g/dL — ABNORMAL LOW (ref 12.0–15.0)
Lymphocytes Relative: 30 % (ref 12.0–46.0)
Lymphs Abs: 1.2 K/uL (ref 0.7–4.0)
MCHC: 32.8 g/dL (ref 30.0–36.0)
MCV: 88.3 fl (ref 78.0–100.0)
Monocytes Absolute: 0.4 K/uL (ref 0.1–1.0)
Monocytes Relative: 9.3 % (ref 3.0–12.0)
Neutro Abs: 2.1 K/uL (ref 1.4–7.7)
Neutrophils Relative %: 54.8 % (ref 43.0–77.0)
Platelets: 236 K/uL (ref 150.0–400.0)
RBC: 3.84 Mil/uL — ABNORMAL LOW (ref 3.87–5.11)
RDW: 14.7 % (ref 11.5–15.5)
WBC: 3.9 K/uL — ABNORMAL LOW (ref 4.0–10.5)

## 2024-01-13 LAB — LIPID PANEL
Cholesterol: 119 mg/dL (ref 0–200)
HDL: 58.7 mg/dL (ref >39.00)
LDL Cholesterol: 43 mg/dL (ref 0–99)
NonHDL: 60.13
Total CHOL/HDL Ratio: 2
Triglycerides: 85 mg/dL (ref 0.0–149.0)
VLDL: 17 mg/dL (ref 0.0–40.0)

## 2024-01-13 LAB — URINALYSIS, ROUTINE W REFLEX MICROSCOPIC
Bilirubin Urine: NEGATIVE
Hgb urine dipstick: NEGATIVE
Ketones, ur: NEGATIVE
Leukocytes,Ua: NEGATIVE
Nitrite: NEGATIVE
RBC / HPF: NONE SEEN (ref 0–?)
Specific Gravity, Urine: 1.025 (ref 1.000–1.030)
Total Protein, Urine: NEGATIVE
Urine Glucose: NEGATIVE
Urobilinogen, UA: 0.2 (ref 0.0–1.0)
pH: 6 (ref 5.0–8.0)

## 2024-01-13 LAB — BASIC METABOLIC PANEL WITH GFR
BUN: 18 mg/dL (ref 6–23)
CO2: 28 meq/L (ref 19–32)
Calcium: 9.2 mg/dL (ref 8.4–10.5)
Chloride: 104 meq/L (ref 96–112)
Creatinine, Ser: 0.92 mg/dL (ref 0.40–1.20)
GFR: 58.33 mL/min — ABNORMAL LOW (ref >60.00)
Glucose, Bld: 97 mg/dL (ref 70–99)
Potassium: 3.7 meq/L (ref 3.5–5.1)
Sodium: 140 meq/L (ref 135–145)

## 2024-01-13 LAB — IBC + FERRITIN
Ferritin: 416.3 ng/mL — ABNORMAL HIGH (ref 10.0–291.0)
Iron: 85 ug/dL (ref 42–145)
Saturation Ratios: 27.6 % (ref 20.0–50.0)
TIBC: 308 ug/dL (ref 250.0–450.0)
Transferrin: 220 mg/dL (ref 212.0–360.0)

## 2024-01-13 LAB — HEPATIC FUNCTION PANEL
ALT: 11 U/L (ref 0–35)
AST: 17 U/L (ref 0–37)
Albumin: 4.3 g/dL (ref 3.5–5.2)
Alkaline Phosphatase: 38 U/L — ABNORMAL LOW (ref 39–117)
Bilirubin, Direct: 0.3 mg/dL (ref 0.0–0.3)
Total Bilirubin: 0.7 mg/dL (ref 0.2–1.2)
Total Protein: 7.3 g/dL (ref 6.0–8.3)

## 2024-01-13 LAB — TSH: TSH: 1.32 u[IU]/mL (ref 0.35–5.50)

## 2024-01-13 LAB — HEMOGLOBIN A1C: Hgb A1c MFr Bld: 6.6 % — ABNORMAL HIGH (ref 4.6–6.5)

## 2024-01-13 NOTE — Progress Notes (Signed)
 Subjective:  Patient ID: Alexandria [name], female    DOB: [birth year]  Age: 83 y.o. MRN: [id number]  CC: Hypertension, Hyperlipidemia, and Diabetes   HPI Alexandria [name] presents for f/up -  Discussed the use of AI scribe software for clinical note transcription with the patient, who gave verbal consent to proceed.  History of Present Illness Alexandria [name] is an 83 year old female who presents with a recent episode of abdominal pain.  She experienced abdominal pain for a few days, which resolved by the day of the visit. There was no associated nausea, vomiting, or changes in bowel movements. Her appetite remained unaffected, and bowel movements were regular without constipation, diarrhea, or blood in the stool.  She has a history of gallbladder removal. She also experienced a cough following congestion last week, which she attributes to allergies. The cough persists but is mild, with no production of blood or phlegm. No fever, chills, night sweats, or chest pain were reported.  She notes that her ankles have been swelling, though not as severely as in the past. Additionally, her blood sugar levels drop into the sixties at night, for which she keeps a banana by her bed to eat if needed. Her blood sugar monitor is set to alert at seventy.     Outpatient Medications Prior to Visit  Medication Sig Dispense Refill   Bimatoprost (LUMIGAN OP) Apply to eye.     Continuous Glucose Receiver (FREESTYLE LIBRE 3 READER) DEVI Use with Libre 3 Plus sensors 1 each 0   Continuous Glucose Sensor (FREESTYLE LIBRE 3 PLUS SENSOR) MISC Change sensor every 15 days. 2 each 5   cyclobenzaprine  (FLEXERIL ) 5 MG tablet Take 1 tablet (5 mg total) by mouth 3 (three) times daily as needed for muscle spasms. 270 tablet 1   eszopiclone  (LUNESTA ) 2 MG TABS tablet TAKE ONE TABLET BY MOUTH EVERY DAY AT BEDTIME AS NEEDED FOR SLEEP (AVOID HIGH FAT/HEAVY MEAL) 90 tablet 0   fluocinonide  cream (LIDEX ) 0.05 % Apply 1 application.  topically 2 (two) times daily. 60 g 1   indapamide  (LOZOL ) 1.25 MG tablet TAKE ONE TABLET BY MOUTH EVERY DAY 90 tablet 1   Iron -FA-B Cmp-C-Biot-Probiotic (FUSION PLUS) CAPS TAKE 1 CAPSULE BY MOUTH EVERY DAY 30 capsule 2   lidocaine  (LIDODERM ) 5 % Place 1 patch onto the skin daily. Remove & Discard patch within 12 hours or as directed by MD 90 patch 1   losartan  (COZAAR ) 100 MG tablet Take 1 tablet (100 mg total) by mouth daily. 90 tablet 1   metoprolol  tartrate (LOPRESSOR ) 25 MG tablet Take 0.5 tablets (12.5 mg total) by mouth every morning. 90 tablet 3   pantoprazole  (PROTONIX ) 40 MG tablet Take 1 tablet (40 mg total) by mouth daily. 90 tablet 3   potassium chloride  SA (KLOR-CON  M) 20 MEQ tablet Take 1 tablet (20 mEq total) by mouth 2 (two) times daily. (Patient taking differently: Take 20 mEq by mouth daily.) 180 tablet 1   rosuvastatin  (CRESTOR ) 20 MG tablet Take 1 tablet (20 mg total) by mouth daily. 90 tablet 1   Turmeric 500 MG CAPS Take 1 capsule by mouth daily. 90 capsule 1   XIIDRA  5 % SOLN Apply 1 drop to eye 2 (two) times daily.     zinc  gluconate 50 MG tablet Take 1 tablet (50 mg total) by mouth daily. 90 tablet 1   Blood Glucose Calibration (ACCU-CHEK GUIDE CONTROL) LIQD 1 Act by In Vitro route 2 (two) times daily. 1  each 2   Blood Glucose Monitoring Suppl (ACCU-CHEK GUIDE ME) w/Device KIT 1 Act by Does not apply route 2 (two) times daily.     Elastic Bandages & Supports (JOBST KNEE HIGH COMPRESSION SM) MISC Dispense black compression stocking, size 9.5 20-42mmHg 1 each 0   glucose blood (ACCU-CHEK GUIDE TEST) test strip Use as instructed 100 each 5   No facility-administered medications prior to visit.    ROS Review of Systems  Constitutional:  Negative for appetite change, chills, diaphoresis, fatigue and fever.  HENT: Negative.    Eyes: Negative.   Respiratory: Negative.  Negative for cough, chest tightness, shortness of breath and wheezing.   Cardiovascular:  Negative for  chest pain, palpitations and leg swelling.  Gastrointestinal: Negative.  Negative for abdominal pain, constipation, diarrhea, nausea and vomiting.  Genitourinary:  Negative for difficulty urinating and dysuria.  Musculoskeletal:  Positive for arthralgias. Negative for myalgias.  Skin: Negative.   Neurological:  Negative for dizziness, weakness and light-headedness.  Hematological:  Negative for adenopathy. Does not bruise/bleed easily.  Psychiatric/Behavioral: Negative.      Objective:  BP 136/68 (BP Location: Left Arm, Patient Position: Sitting, Cuff Size: Normal)   Pulse 60   Temp 98.5 F (36.9 C) (Oral)   Resp 16   Ht 5' 6 (1.676 m)   Wt 175 lb 6.4 oz (79.6 kg)   SpO2 98%   BMI 28.31 kg/m   BP Readings from Last 3 Encounters:  01/13/24 136/68  08/30/23 (!) 146/78  07/14/23 130/64    Wt Readings from Last 3 Encounters:  01/13/24 175 lb 6.4 oz (79.6 kg)  01/10/24 179 lb (81.2 kg)  08/30/23 179 lb 6.4 oz (81.4 kg)    Physical Exam Vitals reviewed.  Constitutional:      Appearance: Normal appearance.  HENT:     Nose: Nose normal.     Mouth/Throat:     Mouth: Mucous membranes are moist.  Eyes:     General: No scleral icterus.    Conjunctiva/sclera: Conjunctivae normal.  Cardiovascular:     Rate and Rhythm: Regular rhythm. Bradycardia present.     Heart sounds: Normal heart sounds and S1 normal. Heart sounds not distant. No murmur heard.    Comments: EKG--- SB, 59 bpm Minimal LVH TWI V1-V3 No Q waves Unchanged  Pulmonary:     Effort: Pulmonary effort is normal.     Breath sounds: No stridor. No wheezing, rhonchi or rales.  Abdominal:     General: Abdomen is flat.     Palpations: There is no mass.     Tenderness: There is no abdominal tenderness. There is no guarding.     Hernia: No hernia is present.  Musculoskeletal:     Cervical back: Neck supple.     Right lower leg: No edema.     Left lower leg: No edema.  Lymphadenopathy:     Cervical: No  cervical adenopathy.  Skin:    General: Skin is warm and dry.  Neurological:     General: No focal deficit present.     Mental Status: She is alert. Mental status is at baseline.  Psychiatric:        Mood and Affect: Mood normal.        Behavior: Behavior normal.     Lab Results  Component Value Date   WBC 3.9 (L) 01/13/2024   HGB 11.1 (L) 01/13/2024   HCT 33.9 (L) 01/13/2024   PLT 236.0 01/13/2024   GLUCOSE 97 01/13/2024  CHOL 119 01/13/2024   TRIG 85.0 01/13/2024   HDL 58.70 01/13/2024   LDLCALC 43 01/13/2024   ALT 11 01/13/2024   AST 17 01/13/2024   NA 140 01/13/2024   K 3.7 01/13/2024   CL 104 01/13/2024   CREATININE 0.92 01/13/2024   BUN 18 01/13/2024   CO2 28 01/13/2024   TSH 1.32 01/13/2024   HGBA1C 6.6 (H) 01/13/2024   MICROALBUR <0.7 08/30/2023    MM 3D SCREENING MAMMOGRAM BILATERAL BREAST Result Date: 02/09/2023 CLINICAL DATA:  Screening. EXAM: DIGITAL SCREENING BILATERAL MAMMOGRAM WITH TOMOSYNTHESIS AND CAD TECHNIQUE: Bilateral screening digital craniocaudal and mediolateral oblique mammograms were obtained. Bilateral screening digital breast tomosynthesis was performed. The images were evaluated with computer-aided detection. COMPARISON:  Previous exam(s). ACR Breast Density Category c: The breasts are heterogeneously dense, which may obscure small masses. FINDINGS: There are no findings suspicious for malignancy. IMPRESSION: No mammographic evidence of malignancy. A result letter of this screening mammogram will be mailed directly to the patient. RECOMMENDATION: Screening mammogram in one year. (Code:SM-B-01Y) BI-RADS CATEGORY  1: Negative. Electronically Signed   By: Rosaline Collet M.D.   On: 02/09/2023 08:31    Assessment & Plan:   Iron  deficiency anemia secondary to inadequate dietary iron  intake- H/H are lower, ferritin is elevated, c/w the anemia of CD. -     IBC + Ferritin; Future -     CBC with Differential/Platelet; Future  Hyperlipidemia with  target LDL less than 100- LDL goal achieved. Doing well on the statin  -     Lipid panel; Future -     Hepatic function panel; Future -     TSH; Future  Need for immunization against influenza -     Flu vaccine HIGH DOSE PF(Fluzone Trivalent)  Essential hypertension- BP is well controlled. -     Urinalysis, Routine w reflex microscopic; Future -     EKG 12-Lead -     Basic metabolic panel with GFR; Future -     Hepatic function panel; Future -     TSH; Future  Type 2 diabetes mellitus with stage 3a chronic kidney disease, without long-term current use of insulin (HCC)- Her blood sugar is well controlled.     Follow-up: Return in about 4 months (around 05/15/2024).  Debby Molt, MD

## 2024-01-13 NOTE — Patient Instructions (Signed)

## 2024-02-07 ENCOUNTER — Other Ambulatory Visit: Payer: Self-pay | Admitting: Internal Medicine

## 2024-02-07 DIAGNOSIS — Z1231 Encounter for screening mammogram for malignant neoplasm of breast: Secondary | ICD-10-CM

## 2024-02-10 ENCOUNTER — Ambulatory Visit
Admission: RE | Admit: 2024-02-10 | Discharge: 2024-02-10 | Disposition: A | Discharge status: Home / Self Care | Payer: Medicare PPO | Source: Ambulatory Visit | Attending: Internal Medicine | Admitting: Internal Medicine

## 2024-02-10 DIAGNOSIS — Z1231 Encounter for screening mammogram for malignant neoplasm of breast: Secondary | ICD-10-CM

## 2024-02-23 ENCOUNTER — Other Ambulatory Visit: Payer: Self-pay | Admitting: Internal Medicine

## 2024-02-23 DIAGNOSIS — D508 Other iron deficiency anemias: Secondary | ICD-10-CM

## 2024-03-08 ENCOUNTER — Other Ambulatory Visit: Payer: Self-pay | Admitting: Adult Health

## 2024-03-08 ENCOUNTER — Other Ambulatory Visit: Payer: Self-pay | Admitting: Internal Medicine

## 2024-03-08 DIAGNOSIS — E785 Hyperlipidemia, unspecified: Secondary | ICD-10-CM

## 2024-03-08 DIAGNOSIS — G4701 Insomnia due to medical condition: Secondary | ICD-10-CM

## 2024-03-08 DIAGNOSIS — I10 Essential (primary) hypertension: Secondary | ICD-10-CM

## 2024-03-08 DIAGNOSIS — E118 Type 2 diabetes mellitus with unspecified complications: Secondary | ICD-10-CM

## 2024-03-14 DIAGNOSIS — H401131 Primary open-angle glaucoma, bilateral, mild stage: Secondary | ICD-10-CM | POA: Diagnosis not present

## 2024-03-14 DIAGNOSIS — E119 Type 2 diabetes mellitus without complications: Secondary | ICD-10-CM | POA: Diagnosis not present

## 2024-03-14 DIAGNOSIS — H16223 Keratoconjunctivitis sicca, not specified as Sjogren's, bilateral: Secondary | ICD-10-CM | POA: Diagnosis not present

## 2024-04-05 DIAGNOSIS — I471 Supraventricular tachycardia, unspecified: Secondary | ICD-10-CM | POA: Insufficient documentation

## 2024-04-05 NOTE — Progress Notes (Signed)
" °  Cardiology Office Note:   Date:  04/06/2024  ID:  SLOAN GALENTINE, DOB 1940-11-07, MRN [id number] PCP: Joshua Debby CROME, MD  [facility] Providers Cardiologist:  Lynwood Schilling, MD {  History of Present Illness:   Alexandria [name] is a 84 y.o. female who I saw last in 2023 for evaluation predominantly of shortness of breath.  She had a perfusion study this week with a small defect with a small inferior infarct with peri-infarct ischemia.  She did have an echocardiogram with an EF of 55 to 60%.  There were no significant valvular abnormalities.    CT demonstrated mildly coronary plaque.   She had syncope and in 2024 had a monitor that did not demonstrate an rhythm that would be an etiology for this.    She reports that her dizziness and palpitations which were her primary concern are controlled on beta blocker.  The patient denies any new symptoms such as chest discomfort, neck or arm discomfort. There has been no new shortness of breath, PND or orthopnea. There have been no reported palpitations, presyncope or syncope.    She does some household chores but she is not exercising routinely.    ROS: As stated in the HPI and negative for all other systems.  Studies Reviewed:    EKG:    NA  Risk Assessment/Calculations:      Physical Exam:   VS:  BP (!) 155/72 (BP Location: Left Arm, Patient Position: Sitting)   Pulse 75   Ht 5' 6 (1.676 m)   Wt 184 lb (83.5 kg)   SpO2 98%   BMI 29.70 kg/m    Wt Readings from Last 3 Encounters:  04/06/24 184 lb (83.5 kg)  01/13/24 175 lb 6.4 oz (79.6 kg)  01/10/24 179 lb (81.2 kg)     GEN: Well nourished, well developed in no acute distress NECK: No JVD; No carotid bruits CARDIAC: RRR, no murmurs, rubs, gallops RESPIRATORY:  Clear to auscultation without rales, wheezing or rhonchi  ABDOMEN: Soft, non-tender, non-distended EXTREMITIES:  No edema; No deformity   ASSESSMENT AND PLAN:   Shortness of breath: This is significant problem.  She had  minimal coronary plaque few years ago.  No further workup is suggested.    Dyslipidemia: LDL was at 43 with an HDL of 58.7 which is an excellent response to therapy.  No change in therapy.   Diabetes mellitus:   A1c is 6.6.  No change in therapy.    Hypertension: The patient's blood pressure is is not at target today.  She is to keep a blood pressure diary and send those to me.  I probably would add spironolactone if her blood pressure is not at target.   PSVT:    She had a little bit of this on monitor but she is not particular bothered by this.  No change in therapy.     Follow up with us  as needed.  Signed, Lynwood Schilling, MD   "

## 2024-04-06 ENCOUNTER — Ambulatory Visit: Attending: Cardiology | Admitting: Cardiology

## 2024-04-06 ENCOUNTER — Encounter: Payer: Self-pay | Admitting: Cardiology

## 2024-04-06 VITALS — BP 155/72 | HR 75 | Ht 66.0 in | Wt 184.0 lb

## 2024-04-06 DIAGNOSIS — I471 Supraventricular tachycardia, unspecified: Secondary | ICD-10-CM

## 2024-04-06 DIAGNOSIS — I10 Essential (primary) hypertension: Secondary | ICD-10-CM | POA: Diagnosis not present

## 2024-04-06 DIAGNOSIS — E118 Type 2 diabetes mellitus with unspecified complications: Secondary | ICD-10-CM

## 2024-04-06 DIAGNOSIS — R0602 Shortness of breath: Secondary | ICD-10-CM | POA: Diagnosis not present

## 2024-04-06 DIAGNOSIS — E785 Hyperlipidemia, unspecified: Secondary | ICD-10-CM | POA: Diagnosis not present

## 2024-04-06 NOTE — Patient Instructions (Signed)
 Medication Instructions:  Your physician recommends that you continue on your current medications as directed. Please refer to the Current Medication list given to you today.  *If you need a refill on your cardiac medications before your next appointment, please call your pharmacy*  Lab Work: NONE If you have labs (blood work) drawn today and your tests are completely normal, you will receive your results only by: MyChart Message (if you have MyChart) OR A paper copy in the mail If you have any lab test that is abnormal or we need to change your treatment, we will call you to review the results.  Testing/Procedures: NONE  Follow-Up: At [facility], you and your health needs are our priority.  As part of our continuing mission to provide you with exceptional heart care, our providers are all part of one team.  This team includes your primary Cardiologist (physician) and Advanced Practice Providers or APPs (Physician Assistants and Nurse Practitioners) who all work together to provide you with the care you need, when you need it.  Your next appointment:   As needed  Provider:   [place], MD   We recommend signing up for the patient portal called MyChart.  Sign up information is provided on this After Visit Summary.  MyChart is used to connect with patients for Virtual Visits (Telemedicine).  Patients are able to view lab/test results, encounter notes, upcoming appointments, etc.  Non-urgent messages can be sent to your provider as well.   To learn more about what you can do with MyChart, go to [email or web address].   Other Instructions Blood pressure diary: take your blood pressure twice daily for 10 days and send us  the readings

## 2024-04-18 ENCOUNTER — Encounter: Payer: Self-pay | Admitting: *Deleted

## 2024-04-18 NOTE — Progress Notes (Signed)
 Alexandria [name]                                          MRN: [id number]   04/18/2024   The VBCI Quality Team Specialist reviewed this patient medical record for the purposes of chart review for care gap closure. The following were reviewed: abstraction for care gap closure-kidney health evaluation for diabetes:eGFR  and uACR.    VBCI Quality Team

## 2024-04-20 ENCOUNTER — Telehealth: Payer: Self-pay

## 2024-04-20 NOTE — Telephone Encounter (Signed)
 Copied from CRM #[id number]. Topic: Clinical - Medication Refill >> Apr 20, 2024 11:28 AM Pinkey ORN wrote: Medication: nabumetone  (RELAFEN ) 500 MG tablet  Has the patient contacted their pharmacy? Yes (Agent: If no, request that the patient contact the pharmacy for the refill. If patient does not wish to contact the pharmacy document the reason why and proceed with request.) (Agent: If yes, when and what did the pharmacy advise?)  This is the patient's preferred pharmacy:   [facility] - [place], [place] - [address] [facility] [address] [place] [address] [place] [address] Phone: [phone] Fax: [phone]  Is this the correct pharmacy for this prescription? Yes If no, delete pharmacy and type the correct one.   Has the prescription been filled recently? No  Is the patient out of the medication? Yes  Has the patient been seen for an appointment in the last year OR does the patient have an upcoming appointment? Yes  Can we respond through MyChart? No  Agent: Please be advised that Rx refills may take up to 3 business days. We ask that you follow-up with your pharmacy.

## 2024-04-21 ENCOUNTER — Other Ambulatory Visit: Payer: Self-pay

## 2024-04-21 NOTE — Telephone Encounter (Signed)
 Please advise the patient states that this is a PRN medication for her arthritis and she's been taking It for a long time. I do not see this medication on her med list but she says that her bottle says 2024. Please advise if she can continue taking this medication as a PRN and if so can you send the medication refill to her pharmacy [facility].

## 2024-04-25 NOTE — Telephone Encounter (Signed)
 Patient wants to know If there is anything that you can send in for the arthritis that won't bother her kidney function

## 2024-05-02 ENCOUNTER — Other Ambulatory Visit: Payer: Self-pay | Admitting: Internal Medicine

## 2024-05-03 ENCOUNTER — Telehealth: Payer: Self-pay | Admitting: Cardiology

## 2024-05-03 ENCOUNTER — Other Ambulatory Visit: Payer: Self-pay | Admitting: Internal Medicine

## 2024-05-03 DIAGNOSIS — M545 Low back pain, unspecified: Secondary | ICD-10-CM

## 2024-05-03 DIAGNOSIS — M5030 Other cervical disc degeneration, unspecified cervical region: Secondary | ICD-10-CM

## 2024-05-03 DIAGNOSIS — M5136 Other intervertebral disc degeneration, lumbar region with discogenic back pain only: Secondary | ICD-10-CM

## 2024-05-03 DIAGNOSIS — M4802 Spinal stenosis, cervical region: Secondary | ICD-10-CM

## 2024-05-03 DIAGNOSIS — M4710 Other spondylosis with myelopathy, site unspecified: Secondary | ICD-10-CM

## 2024-05-03 MED ORDER — HYDROCODONE-ACETAMINOPHEN 5-325 MG PO TABS: 1.0000 | ORAL_TABLET | Freq: Three times a day (TID) | ORAL | 0 refills | Status: AC | PRN

## 2024-05-03 NOTE — Telephone Encounter (Signed)
 Pt c/o BP issue: STAT if pt c/o blurred vision, one-sided weakness or slurred speech.  STAT if BP is GREATER than 180/120 TODAY.  STAT if BP is LESS than 90/60 and SYMPTOMATIC TODAY  1. What is your BP concern? HTN   2. Have you taken any BP medication today? Yes   3. What are your last 5 BP readings? 1/9: 123/59 143/54 1/10: 132/65, 169/59 1/11: 145/59, 144/53 1/12: 131/57, 129/62 1/13: 147/56, 156/71 1/14: 124/59,  1/15: 129/47, 144/62 1/16: 134/57 1/17: 138/50, 149/58 1/18: 137/63, 151/62 1/19: 149/62, 141/60 1/20:142/59, 1/21: 144/70 1/22:122/55 2/4:144/59  4. Are you having any other symptoms (ex. Dizziness, headache, blurred vision, passed out)? No    Pt states was advised by provider to track BP so metoprolol  may or not be exchanged for something else. Pt would like a c/b regarding this matter.

## 2024-05-05 ENCOUNTER — Telehealth: Payer: Self-pay

## 2024-05-05 ENCOUNTER — Other Ambulatory Visit: Payer: Self-pay

## 2024-05-05 DIAGNOSIS — G4701 Insomnia due to medical condition: Secondary | ICD-10-CM

## 2024-05-05 DIAGNOSIS — E876 Hypokalemia: Secondary | ICD-10-CM

## 2024-05-05 DIAGNOSIS — I10 Essential (primary) hypertension: Secondary | ICD-10-CM

## 2024-05-05 MED ORDER — METOPROLOL TARTRATE 25 MG PO TABS: 12.5000 mg | ORAL_TABLET | Freq: Every day | ORAL | 1 refills | Status: AC

## 2024-05-05 MED ORDER — POTASSIUM CHLORIDE CRYS ER 20 MEQ PO TBCR: 20.0000 meq | EXTENDED_RELEASE_TABLET | Freq: Two times a day (BID) | ORAL | 1 refills | Status: AC

## 2024-05-05 MED ORDER — ESZOPICLONE 2 MG PO TABS: 2.0000 mg | ORAL_TABLET | Freq: Every evening | ORAL | 0 refills | Status: AC | PRN

## 2024-05-05 NOTE — Telephone Encounter (Signed)
 Attempted to call pt at home number. No voicemail set up. Unable to leave voicemail.

## 2024-05-05 NOTE — Telephone Encounter (Signed)
 Medication refill request has been sent to Dr. Yetta Barre

## 2024-05-05 NOTE — Telephone Encounter (Signed)
 Copied from CRM [id number]. Topic: Clinical - Medication Refill >> May 05, 2024  8:58 AM Emylou G wrote: Medication: potassium chloride  SA (KLOR-CON  M) 20 MEQ tablet metoprolol  tartrate (LOPRESSOR ) 25 MG tablet eszopiclone  (LUNESTA ) 2 MG TABS tablet   Has the patient contacted their pharmacy? No (Agent: If no, request that the patient contact the pharmacy for the refill. If patient does not wish to contact the pharmacy document the reason why and proceed with request.) (Agent: If yes, when and what did the pharmacy advise?)  This is the patient's preferred pharmacy:   [facility] - [place], [place] - [address] [facility] [address] [place] [address] [place] [address] Phone: [phone] Fax: [phone]  Is this the correct pharmacy for this prescription? Yes If no, delete pharmacy and type the correct one.   Has the prescription been filled recently? No  Is the patient out of the medication? No.. almost  Has the patient been seen for an appointment in the last year OR does the patient have an upcoming appointment? Yes  Can we respond through MyChart? Yes  Agent: Please be advised that Rx refills may take up to 3 business days. We ask that you follow-up with your pharmacy.

## 2024-05-05 NOTE — Telephone Encounter (Signed)
 Attempted to call pt 2/6. Line was busy. Unable to leave voicemail.

## 2025-01-16 ENCOUNTER — Encounter: Admitting: Internal Medicine

## 2025-01-16 ENCOUNTER — Ambulatory Visit
